# Patient Record
Sex: Female | Born: 1935 | Race: White | Hispanic: No | Smoking: Former smoker
Health system: Southern US, Community
[De-identification: ages and names within clinical notes are randomized; demographics above are authoritative.]

## PROBLEM LIST (undated history)

## (undated) DIAGNOSIS — R1030 Lower abdominal pain, unspecified: Secondary | ICD-10-CM

## (undated) DIAGNOSIS — R768 Other specified abnormal immunological findings in serum: Secondary | ICD-10-CM

## (undated) DIAGNOSIS — E119 Type 2 diabetes mellitus without complications: Secondary | ICD-10-CM

## (undated) DIAGNOSIS — J449 Chronic obstructive pulmonary disease, unspecified: Secondary | ICD-10-CM

## (undated) DIAGNOSIS — K589 Irritable bowel syndrome without diarrhea: Secondary | ICD-10-CM

## (undated) DIAGNOSIS — K2970 Gastritis, unspecified, without bleeding: Secondary | ICD-10-CM

## (undated) DIAGNOSIS — I10 Essential (primary) hypertension: Secondary | ICD-10-CM

## (undated) DIAGNOSIS — J189 Pneumonia, unspecified organism: Secondary | ICD-10-CM

## (undated) DIAGNOSIS — M349 Systemic sclerosis, unspecified: Secondary | ICD-10-CM

## (undated) DIAGNOSIS — K529 Noninfective gastroenteritis and colitis, unspecified: Secondary | ICD-10-CM

## (undated) DIAGNOSIS — I639 Cerebral infarction, unspecified: Secondary | ICD-10-CM

## (undated) DIAGNOSIS — F0390 Unspecified dementia without behavioral disturbance: Secondary | ICD-10-CM

## (undated) DIAGNOSIS — A048 Other specified bacterial intestinal infections: Secondary | ICD-10-CM

## (undated) DIAGNOSIS — G4730 Sleep apnea, unspecified: Secondary | ICD-10-CM

## (undated) DIAGNOSIS — I7300 Raynaud's syndrome without gangrene: Secondary | ICD-10-CM

## (undated) DIAGNOSIS — M109 Gout, unspecified: Secondary | ICD-10-CM

## (undated) DIAGNOSIS — K2270 Barrett's esophagus without dysplasia: Secondary | ICD-10-CM

## (undated) HISTORY — DX: Lower abdominal pain, unspecified: R10.30

## (undated) HISTORY — DX: Noninfective gastroenteritis and colitis, unspecified: K52.9

## (undated) HISTORY — DX: Barrett's esophagus without dysplasia: K22.70

## (undated) HISTORY — DX: Other specified bacterial intestinal infections: A04.8

## (undated) HISTORY — DX: Gastritis, unspecified, without bleeding: K29.70

## (undated) HISTORY — PX: CHOLECYSTECTOMY: SHX55

## (undated) HISTORY — DX: Irritable bowel syndrome, unspecified: K58.9

## (undated) HISTORY — PX: PATELLA FRACTURE SURGERY: SHX735

## (undated) HISTORY — PX: BACK SURGERY: SHX140

---

## 1998-09-26 ENCOUNTER — Other Ambulatory Visit: Admission: RE | Admit: 1998-09-26 | Discharge: 1998-09-26 | Payer: Self-pay | Admitting: Obstetrics and Gynecology

## 2000-03-08 HISTORY — PX: ESOPHAGOGASTRODUODENOSCOPY: SHX1529

## 2000-03-13 ENCOUNTER — Other Ambulatory Visit: Admission: RE | Admit: 2000-03-13 | Discharge: 2000-03-13 | Payer: Self-pay | Admitting: Obstetrics and Gynecology

## 2000-03-20 ENCOUNTER — Encounter: Payer: Self-pay | Admitting: Obstetrics and Gynecology

## 2000-03-20 ENCOUNTER — Encounter: Admission: RE | Admit: 2000-03-20 | Discharge: 2000-03-20 | Payer: Self-pay | Admitting: Obstetrics and Gynecology

## 2000-04-09 HISTORY — PX: LUNG SURGERY: SHX703

## 2000-07-23 ENCOUNTER — Encounter (HOSPITAL_COMMUNITY): Admission: RE | Admit: 2000-07-23 | Discharge: 2000-08-22 | Payer: Self-pay | Admitting: Orthopedic Surgery

## 2003-11-20 ENCOUNTER — Ambulatory Visit (HOSPITAL_COMMUNITY): Admission: RE | Admit: 2003-11-20 | Discharge: 2003-11-20 | Payer: Self-pay | Admitting: Family Medicine

## 2004-01-12 ENCOUNTER — Ambulatory Visit (HOSPITAL_COMMUNITY): Admission: RE | Admit: 2004-01-12 | Discharge: 2004-01-12 | Payer: Self-pay | Admitting: Family Medicine

## 2004-01-21 ENCOUNTER — Emergency Department (HOSPITAL_COMMUNITY): Admission: EM | Admit: 2004-01-21 | Discharge: 2004-01-21 | Payer: Self-pay | Admitting: Emergency Medicine

## 2004-03-10 ENCOUNTER — Ambulatory Visit (HOSPITAL_COMMUNITY): Admission: RE | Admit: 2004-03-10 | Discharge: 2004-03-10 | Payer: Self-pay | Admitting: Otolaryngology

## 2004-06-01 ENCOUNTER — Ambulatory Visit (HOSPITAL_COMMUNITY): Admission: RE | Admit: 2004-06-01 | Discharge: 2004-06-01 | Payer: Self-pay | Admitting: Family Medicine

## 2004-06-12 ENCOUNTER — Ambulatory Visit (HOSPITAL_COMMUNITY): Admission: RE | Admit: 2004-06-12 | Discharge: 2004-06-12 | Payer: Self-pay | Admitting: Family Medicine

## 2004-12-13 ENCOUNTER — Ambulatory Visit (HOSPITAL_COMMUNITY): Admission: RE | Admit: 2004-12-13 | Discharge: 2004-12-13 | Payer: Self-pay | Admitting: *Deleted

## 2005-01-30 ENCOUNTER — Ambulatory Visit: Payer: Self-pay | Admitting: Internal Medicine

## 2005-02-02 ENCOUNTER — Ambulatory Visit: Payer: Self-pay | Admitting: Internal Medicine

## 2005-02-02 ENCOUNTER — Encounter (INDEPENDENT_AMBULATORY_CARE_PROVIDER_SITE_OTHER): Payer: Self-pay | Admitting: Internal Medicine

## 2005-02-02 ENCOUNTER — Ambulatory Visit (HOSPITAL_COMMUNITY): Admission: RE | Admit: 2005-02-02 | Discharge: 2005-02-02 | Payer: Self-pay | Admitting: Internal Medicine

## 2005-02-02 HISTORY — PX: ESOPHAGOGASTRODUODENOSCOPY: SHX1529

## 2005-02-06 ENCOUNTER — Ambulatory Visit (HOSPITAL_COMMUNITY): Admission: RE | Admit: 2005-02-06 | Discharge: 2005-02-06 | Payer: Self-pay | Admitting: Internal Medicine

## 2005-04-03 ENCOUNTER — Ambulatory Visit (HOSPITAL_COMMUNITY): Admission: RE | Admit: 2005-04-03 | Discharge: 2005-04-03 | Payer: Self-pay | Admitting: Family Medicine

## 2005-05-14 ENCOUNTER — Ambulatory Visit: Payer: Self-pay | Admitting: Internal Medicine

## 2005-11-07 ENCOUNTER — Ambulatory Visit: Payer: Self-pay | Admitting: Internal Medicine

## 2005-11-13 ENCOUNTER — Ambulatory Visit (HOSPITAL_COMMUNITY): Admission: RE | Admit: 2005-11-13 | Discharge: 2005-11-13 | Payer: Self-pay | Admitting: Internal Medicine

## 2005-11-13 ENCOUNTER — Ambulatory Visit: Payer: Self-pay | Admitting: Internal Medicine

## 2006-05-30 ENCOUNTER — Ambulatory Visit: Payer: Self-pay | Admitting: Internal Medicine

## 2006-05-31 LAB — CBC AND DIFFERENTIAL: Hemoglobin: 12.1 g/dL (ref 12.0–16.0)

## 2006-06-03 ENCOUNTER — Ambulatory Visit: Payer: Self-pay | Admitting: Internal Medicine

## 2007-09-08 HISTORY — PX: ESOPHAGOGASTRODUODENOSCOPY: SHX1529

## 2008-03-26 ENCOUNTER — Ambulatory Visit (HOSPITAL_COMMUNITY): Admission: RE | Admit: 2008-03-26 | Discharge: 2008-03-26 | Payer: Self-pay | Admitting: Family Medicine

## 2008-12-08 HISTORY — PX: COLONOSCOPY: SHX174

## 2010-08-01 ENCOUNTER — Other Ambulatory Visit (INDEPENDENT_AMBULATORY_CARE_PROVIDER_SITE_OTHER): Payer: Self-pay | Admitting: Internal Medicine

## 2010-08-01 ENCOUNTER — Ambulatory Visit (INDEPENDENT_AMBULATORY_CARE_PROVIDER_SITE_OTHER): Payer: Medicare Other | Admitting: Internal Medicine

## 2010-08-01 DIAGNOSIS — R109 Unspecified abdominal pain: Secondary | ICD-10-CM

## 2010-08-01 DIAGNOSIS — R1030 Lower abdominal pain, unspecified: Secondary | ICD-10-CM

## 2010-08-04 ENCOUNTER — Ambulatory Visit (HOSPITAL_COMMUNITY)
Admission: RE | Admit: 2010-08-04 | Discharge: 2010-08-04 | Disposition: A | Discharge status: Home / Self Care | Payer: Medicare Other | Source: Ambulatory Visit | Attending: Internal Medicine | Admitting: Internal Medicine

## 2010-08-04 DIAGNOSIS — K5730 Diverticulosis of large intestine without perforation or abscess without bleeding: Secondary | ICD-10-CM | POA: Insufficient documentation

## 2010-08-04 DIAGNOSIS — R1030 Lower abdominal pain, unspecified: Secondary | ICD-10-CM

## 2010-08-04 DIAGNOSIS — I701 Atherosclerosis of renal artery: Secondary | ICD-10-CM | POA: Insufficient documentation

## 2010-08-04 DIAGNOSIS — R1031 Right lower quadrant pain: Secondary | ICD-10-CM | POA: Insufficient documentation

## 2010-08-04 DIAGNOSIS — R1032 Left lower quadrant pain: Secondary | ICD-10-CM | POA: Insufficient documentation

## 2010-08-04 MED ORDER — IOHEXOL 350 MG/ML SOLN
100.0000 mL | Freq: Once | INTRAVENOUS | Status: AC | PRN
  Administered 2010-08-04: 100 mL via INTRAVENOUS

## 2010-08-25 NOTE — Op Note (Signed)
Tina [name], Tina [name]                ACCOUNT NO.:  [id number]   MEDICAL RECORD NO.:  [id number]          PATIENT TYPE:  AMB   LOCATION:  DAY                           FACILITY:  [facility]   PHYSICIAN:  Lionel December, M.D.    DATE OF BIRTH:  [birth year]   DATE OF PROCEDURE:  11/13/2005  DATE OF DISCHARGE:                                 OPERATIVE REPORT   PROCEDURE:  Esophagogastroduodenoscopy with esophageal dilation.   INDICATIONS:  Tina [name] is a 77 year old Caucasian female with multiple medical  problems including scleroderma who presents with solid food dysphagia.  She  has chronic GERD with short segment Barrett's esophagus.  Her last EGD was  in October2006.  Procedure and risks were reviewed with the patient and  informed consent was obtained.   MEDS FOR CONSCIOUS SEDATION:  Benzocaine spray for oropharyngeal topical  anesthesia.  Demerol 50 mg, IV Versed 12 mg IV in divided dose.   FINDINGS:  Procedure performed in endoscopy suite.  The patient's vital  signs and O2 saturation were monitored during the procedure and remained  stable.  The patient was placed in the left lateral position and Olympus  videoscope was passed via oropharynx without any difficulty into the  esophagus.   ESOPHAGUS:  Mucosa of the esophagus normal except distally she had a short  segment Barrett's and the proximal margin was at 36.  GE junction was  estimated to be at 38 and hiatus was at 40.  Involvement of the distal  esophagus with Barrett's was irregular and there were two islands of  squamous mucosa right in the Barrett's.  There was no obvious stricture  noted.   STOMACH:  It was empty and distended very well with insufflation.  Folds in  the proximal stomach were normal.  Examination of the mucosa revealed both a  linear and a patchy antral vascular ectasia without stigmata of bleeding.  Pyloric channel was patent.  Angularis, fundus, and cardia were examined by  retroflexing the scope and were  normal.   DUODENUM BULBAR MUCOSA:  Normal.  Scope was passed into the second part of  duodenum where the mucosa and folds were normal.  Endoscope was withdrawn.   ESOPHAGUS:  The esophagus was dilated by passing 54-French Maloney dilator  to full insertion.  Endoscope was passed, again, post ED.  There was a small  linear tear to the cervical esophagus possibly indicative of web.  Please  note that esophageal varices were not evident on this exam.  She had grade 1  varices on her last exam.  Endoscope was withdrawn.  The patient tolerated  the procedure well.   FINAL DIAGNOSIS:  1.  Short segment Barrett's esophagus.  2.  Small sliding hiatal hernia.  3.  No obvious ring or stricture noted to esophagus.  4.  Esophageal dilation with 54-French Elease Hashimoto resulted in a small  tear in      cervical segment indicative of a web.  5.  Gastric antral vascular ectasia which usually are seen with  scleroderma      that she has.  This may  also account for anemia.  However, her Hemoccult      that she brought today is negative.   RECOMMENDATIONS:  She will continue her usual meds.  She can resume her ASA.  Her H&H will need to be followed closely.  She will have one in the near  future as recommended by Dr. Jacinto Halim.      Lionel December, M.D.  Electronically Signed     NR/MEDQ  D:  11/13/2005  T:  11/13/2005  Job:  [id number]   cc:   Corrie Mckusick, M.D.  Fax: [phone]   Cristy Hilts. Jacinto Halim, MD  Fax: [phone]

## 2010-08-25 NOTE — Consult Note (Signed)
[name], [name]                ACCOUNT NO.:  [id number]   MEDICAL RECORD NO.:  [id number]           PATIENT TYPE:  AMB   LOCATION:                                FACILITY:  [facility]   PHYSICIAN:  Lionel December, M.D.    DATE OF BIRTH:  July 18, 1933   DATE OF CONSULTATION:  11/07/2005  DATE OF DISCHARGE:                                   CONSULTATION   PRESENTING COMPLAINT:  Dysphagia of one month's duration.   HISTORY OF PRESENT ILLNESS:  Tina [name] is a 77 year old Caucasian female who is  being seen at request by Dr. Phillips Odor for GI evaluation.  Patient has been  seen by me in the past.  Her last visit was in February 2007.  Her last EGD  was in October 2006.  She had short segment Barrett's esophagus and three  columns of grade 1 esophageal varices and a hypoplastic fundal polyp.  She  had small sliding hiatal hernia.  She had her esophagus dilated with 79-  French Maloney dilator and had a small tear in the esophagus indicative of a  web or a tubular narrowing.  When last seen in February she was doing well.  She states she has been having difficulty with solids and pills for about a  month.  She has also been experiencing regurgitation but she denies vomiting  or frequent heartburn.  She was recently treated with Prevpac for two weeks.  She developed abdominal pain and diarrhea.  She was begun on metronidazole  by Dr. Phillips Odor but she stopped the medicine today because she had a normal  stool.  She states her appetite is good but she is afraid to eat because  food would not go down.  She has scleroderma.  She feels that she is having  a relapse.  She is under care of her rheumatologist from [facility] in [place], [place].  She has also seen Dr. Jacinto Halim of [facility] and felt to have coronary artery disease.  She is scheduled to  have some lab studies by him.  She denies melena or rectal bleeding.   She is on ASA 81 mg daily, vitamin E 400 units daily, Cardizem 360 mg  daily,  HCTZ 25 mg daily, K-Dur 20 mEq daily, Nexium 40 mg b.i.d., Lipitor 20 mg  daily, zolpidem 10 mg nightly, metronidazole 500 mg t.i.d. which she did not  today, lisinopril 10 mg daily, paroxetine 20 mg daily.   PAST MEDICAL HISTORY:  1.  Scleroderma since 1988 or 1989 with severe disability.  She has      deformity to both her hands with weak grip because of joint deformity.  2.  Chronic GERD complicated by Barrett's esophagus.  Underlying esophageal      motility is suspected given her connective tissue disorder, although she      has not had formal testing.  3.  History of H. pylori gastritis which was treated by Dr. Gwinda Passe long      time ago and apparently she retreated recently.  4.  She has hypertension.  5.  She has insomnia.  6.  Chronic back pain.  7.  COPD.  8.  Psoriasis.  9.  Raynaud's phenomenon.  10. She also has history of IBS.  11. She had lumbar surgery for disk prolapse several years ago.  12. She had cholecystectomy over 20 years ago.  13. She had right lung surgery for pneumothorax in 1988.  14. She has had right knee surgery for patellar fracture.  15. Her last colonoscopy was in 1999 by Dr. Gwinda Passe of [facility].  She had      melanosis coli and mild diverticulosis.   ALLERGIES:  NK.   FAMILY HISTORY:  Negative for colorectal carcinoma.  One brother died of  melanoma and also had the carcinoma of the bladder.  Another brother had  lung carcinoma.   SOCIAL HISTORY:  She is married.  She has two daughters.  She is retired  from [employer].  She smoked cigarettes for years, but quit 18 years ago.  She does not drink alcohol.   OBJECTIVE:  VITAL SIGNS:  Weight 179 pounds.  She is 5 feet 8 inches tall.  Pulse 64 per minute, blood pressure 152/68, temperature is 97.9.  HEENT:  Conjunctiva is pink.  Sclera is nonicteric.  Oral pharyngeal mucosa  is normal.  No neck masses or thyromegaly noted.  CARDIAC:  Regular rhythm.  Normal S1 and S3.  No murmur or gallop  noted.  LUNGS:  Clear to auscultation.  ABDOMEN:  Full.  Bowel sounds are normal.  She has soft abdomen and mild  generalized tenderness, slightly more pronounced in mid epigastric area, but  no organomegaly or masses noted.  RECTAL:  Deferred.  EXTREMITIES:  She has deformity to both her hands.  She has muscle atrophy  with extension of her metacarpal phalangeal joints and proximal  interphalangeal joints with flexion of her distal interphalangeal joints of  both hands.  She has weak grip.  She does not have peripheral edema or  clubbing.   Labs from May 14, 2005:  Her H&H was 11.6 and 38.7, platelet count was  409,000.  Bilirubin was 0.7, AP 70, AST 48, ALT was 33, albumin 4.3, calcium  was 9.1.   ASSESSMENT:  Tina [name] is 77 year old Caucasian female with chronic  gastroesophageal reflux disease complicated by short segment Barrett's  esophagus who also has scleroderma who presents with solid food dysphagia.  Her esophagus was last dilated in September 2006.  She has recently been  treated with Prevpac.  She may also have superadded candida esophagitis,  although she does not have oral candidiasis.  I feel her upper  gastrointestinal tract needs to be reevaluated and esophagus dilated if  appropriate.   RECOMMENDATIONS:  She will continue antireflux measures and a PPI double  dose.   Diagnostic and possibly therapeutic esophagogastroduodenoscopy in near  future.  Hemoccult x1.  We will request a copy of her next blood work that  she is supposed to have later this month.   We would like to thank Dr. Phillips Odor for opportunity to participate in the  care of this nice lady.      Lionel December, M.D.  Electronically Signed     NR/MEDQ  D:  11/07/2005  T:  11/08/2005  Job:  [id number]   cc:   Cristy Hilts. Jacinto Halim, MD  Fax: [phone]

## 2010-08-25 NOTE — Op Note (Signed)
Tina [name], Tina [name]                ACCOUNT NO.:  [id number]   MEDICAL RECORD NO.:  [id number]          PATIENT TYPE:  AMB   LOCATION:  DAY                           FACILITY:  [facility]   PHYSICIAN:  Lionel December, M.D.    DATE OF BIRTH:  02-20-1934   DATE OF PROCEDURE:  02/02/2005  DATE OF DISCHARGE:                                 OPERATIVE REPORT   PROCEDURE:  Esophagogastroduodenoscopy with esophageal dilation.   INDICATION:  Tina [name] is a 77 year old Caucasian female with longstanding  scleroderma as well as chronic GERD who presents with dysphagia and poorly  controlled heartburn. Her Prilosec dose was bumped to 20 mg b.i.d. a couple  of days ago. She is undergoing diagnostic/therapeutic procedure. Procedure  risks were reviewed with the patient, and informed consent was obtained.   PREMEDICATION:  Cetacaine spray pharyngeal topical anesthesia, Demerol 50 mg  IV, Versed 13 mg IV.   FINDINGS:  Procedure performed in endoscopy suite. The patient's vital signs  and O2 saturation were monitored during the procedure and remained stable.  The patient was placed in left lateral position. Olympus videoscope was  passed via oropharynx without any difficulty into esophagus.   Esophagus. Mucosa of the esophagus was normal except distally she had two  patches of Barrett's type mucosa, no more than 1 cm in diameter. GE junction  was at 38 cm and hiatus was at 40. No ring or stricture was noted.   Stomach. It was empty and distended very well insufflation. Folds of  proximal stomach were normal. Examination of mucosa revealed two polyps just  below the cardia. One was a tubular. Biopsy was taken for histology.  Endoscopically, these were suspicious for hyperplastic polyps, and therefore  polypectomy was not attempted. There were multiple whitish plaques involving  antral mucosa 2-4 mm in size. The biopsy was taken from these plaques for  routine histology. Pyloric channel was patent. Angularis  was unremarkable.   Duodenum. Bulbar mucosa was normal. Scope was passed to the second part of  the duodenum where mucosa and folds were normal. Endoscope was withdrawn.   Esophagus was dilated by passing 54-French Maloney dilator to full  insertion. Some resistance was noted initially. As the dilator was  withdrawn, endoscope was passed again, and there was a linear tear at  cervical esophagus indicative either of web or luminal narrowing. Endoscope  was withdrawn. The patient tolerated the procedure well.   FINAL DIAGNOSIS:  1.  Multiple findings.  2.  Short segment Barrett's mucosa which is stable/unchanged since previous      EGD.  3.  New finding of grade 1 three columns of esophageal varices.  4.  Fundal gastric polyps which were biopsied for histology. Antral mucosal      plaques which were also biopsied for histology.  5.  Small sliding hiatal hernia.  6.  Esophageal dial dilation resulted in linear tear at cervical esophagus      indicative of web or tubular narrowing.   RECOMMENDATIONS:  1.  She will continue anti-reflux measures and Prilosec at b.i.d.  2.  The abdominal ultrasound  will be obtained with attention to liver and      spleen to make sure she does not have changes to suggest cirrhosis.  3.  I will be contacting the patient with results of biopsy and ultrasound,      and I will be contacting the patient with results of pending studies.      Lionel December, M.D.  Electronically Signed     NR/MEDQ  D:  02/02/2005  T:  02/02/2005  Job:  [id number]   cc:   Corrie Mckusick, M.D.  Fax: [phone]

## 2010-08-25 NOTE — Consult Note (Signed)
NAMELEXANDRA, RETTKE                ACCOUNT NO.:  [id number]   MEDICAL RECORD NO.:  [id number]          PATIENT TYPE:  AMB   LOCATION:  DAY                           FACILITY:  [facility]   PHYSICIAN:  Lionel December, M.D.    DATE OF BIRTH:  03-31-1934   DATE OF CONSULTATION:  01/30/2005  DATE OF DISCHARGE:                                   CONSULTATION   REASON FOR CONSULTATION:  Dysphagia, chronic gastroesophageal reflux  disease.   HISTORY OF PRESENT ILLNESS:  Tina [name] is a 77 year old Caucasian female who is  referred through the courtesy of Dr. Phillips Odor for GI evaluation.  She has  chronic GERD, possibly related to her scleroderma who has been experiencing  dysphagia for about a year, primarily systolic.  She feels as if food gets  stuck in her suprasternal area.  She also complains of heartburn at least  twice a week, usually relieved with Rolaids.  She also complains of cough  frequently to clear her throat as well as frequent burping and intermittent  vomiting usually once a week.  She was seen by Dr. Pollyann Kennedy in November 2005  for throat symptoms.  Her flexible fiberoptic laryngoscopy was normal.  He  felt that she had some chronic oto-nasal possibly due to chronic sinusitis.  Clearly, she also had a CT of the sinuses.  At that time, she was  complaining of dysphagia, and he had recommended GI follow up.  The patient  says she has a good appetite.  She has gained about 18 pounds in the last 4-  1/2 years.  She says it has been occurring gradually.  Lately, she has not  been physically active.  Her daughter states that when her mother was  living, she would go to the nursing home everyday, but since she has passed  away, the patient has quit doing that.   Her last EGD was in November 2001.  She had small sliding hiatal hernia,  possibly a patch of Barrett's esophagus.  She has fine nodularity to  entrance consistent with gastritis.  Two tiny polyps that were ablated by  cold biopsy, and  turned out to be hypoplastic polyps.  She apparently has  been treated for H. pylori gastritis per Dr. Elaina Hoops.  Her prior EGD was by  him back in June 1990.   REVIEW OF SYSTEMS:  Negative for melena or rectal bleeding.  She has  irregular bowel movements.  She has had at least one colonoscopy, but she  does not remember when.  She was supposed to have colonoscopy in July 2000  at [facility], but it is not clear whether she had the procedure or not.  Hospital records will be reviewed.   MEDICATIONS:  1.  Prilosec 20 mg q.a.m.  2.  Imdur 60 mg daily.  3.  Ambien 10 mg q.h.s.  4.  Ativan AS 81 mg daily.  5.  Carafate 1 gm a.c. and q.h.s. p.r.n.  6.  Vitamin E daily.  7.  Cardizem SR 300 mg daily.  8.  HCTZ 25 mg daily.  9.  Reglan 10 mg q.a.m. and q.h.s. p.r.n.   PAST MEDICAL HISTORY:  1.  History of scleroderma which was diagnosed in 45.  He has been on      prednisone in the past.  2.  History of hypertension.  3.  Reflux esophagitis as reviewed above.  4.  Chronic insomnia.  5.  Lumbar surgery for disk prolapse over 30 years ago.  6.  Cholecystectomy about 25 years ago.  7.  Right lung surgery in 1988 for pneumothorax.  She possibly had      decortication.  8.  Right knee arthrostomy for patellar fracture injury.  9.  COPD.  10. Psoriasis.  11. Raynaud's phenomenon.  12. Coronary artery disease.  13. According to her daughter, she had nuclear medicine testing and CT is      suspected to have one vessel disease.  She is felt to be high risk for      cardiac catheterization.  She has seen Dr. Jacinto Halim.  14. IBS.   ALLERGIES:  NK.   FAMILY HISTORY:  Mother lived to be 17.  Father died of heart disease at age  26, also had asthma.  One sister died at age 62 because of house fire.  One  brother died at age 96.  Another brother had multiple malignancies including  melanoma, carcinoma of the bladder as well as lungs.  Another brother is  being treated for carcinoma of the  lung.   SOCIAL HISTORY:  She is married.  She has two daughters.  One lives in town.  She is retired from [employer].  She smoked for about 15 years but quit  several years ago, and she does not drink alcohol.  She and her husband live  in [place].   PHYSICAL EXAMINATION:  GENERAL APPEARANCE:  Pleasant, well-developed, well-  nourished, Caucasian female who was in no acute distress.  VITAL SIGNS:  Weight 189-1/2 pounds, height 68 inches, pulse 100, blood  pressure 152/80, temperature 98.4.  HEENT:  Conjunctivae is pink.  Sclerae is nonicteric.  Oropharyngeal mucosa  is normal.  NECK:  Without masses or thyromegaly.  Carotids are 2+ bilaterally without  bruits.  CARDIAC:  Regular rhythm.  Normal S1, S2.  No murmur or gallop noted.  LUNGS:  Clear to auscultation.  ABDOMEN:  Symmetrical.  Bowel sounds are normal on palpation.  Soft and  nontender without organomegaly or masses.  RECTAL:  Deferred.  EXTREMITIES:  She has some atrophy to muscles in both forearms, and she also  has some atrophy in the palm of her hands.  She does not have clubbing or  peripheral edema.   ASSESSMENT:  Tina [name] is a 77 year old Caucasian female with chronic GERD,  possibly related to esophageal dysmotility secondary to underlying  scleroderma whose symptoms are not well controlled with PPI who now presents  with dysphagia.  Her last EGD was in November 2005 with question of short  segment Barrett's esophagus.  She does not have any alarm systems.   She is average risk for colorectal carcinoma.  Presuming she has had  colonoscopy in the last five years, she would not need another one for at  least five.   RECOMMENDATIONS:  1.  Antireflux measures reinforced.  2.  Raise Prilosec to 20 mg b.i.d.  Prescription given for one month with 11      refills.  3.  She will undergo esophagogastroduodenoscopy and esophageal dilation at     [facility] in the near future.  I  have asked her to hold offer her ASA just for       two days.  Further recommendations will depend on endoscopic findings.  4.  We would like to thank Dr. Phillips Odor for the opportunity to participate in      the care of this nice lady.  5.  She could have esophageal stricture, Schatzki's ring or motility      disorder.   ASSESSMENT:  Hopefully, we are dealing with esophageal stricture which  should be amenable to endoscopic surgery.  Dysphagia secondary to motility  disorder would be difficult to treat.   Average risk for CRC.  Will review old records and find out timing of her  last colonoscopy.   RECOMMENDATIONS:  1.  Antireflux measures reinforced.  Will increase her Prilosec to 20 mg      b.i.d.  Prescription given for #60 with 11 refills.  2.  Esophagogastroduodenoscopy impossible esophageal dilation at [facility] in the near future.  She would hold off her ASA just for two      days prior to the procedure.   We would like to thank Dr. Phillips Odor for the opportunity to participate in the  care of this nice lady.      Lionel December, M.D.  Electronically Signed     NR/MEDQ  D:  01/30/2005  T:  01/31/2005  Job:  [id number]   cc:   Corrie Mckusick, M.D.  Fax: [phone]

## 2010-11-13 ENCOUNTER — Encounter (INDEPENDENT_AMBULATORY_CARE_PROVIDER_SITE_OTHER): Payer: Self-pay | Admitting: *Deleted

## 2010-11-14 ENCOUNTER — Encounter (INDEPENDENT_AMBULATORY_CARE_PROVIDER_SITE_OTHER): Payer: Self-pay

## 2010-12-18 ENCOUNTER — Ambulatory Visit (INDEPENDENT_AMBULATORY_CARE_PROVIDER_SITE_OTHER): Payer: Medicare Other | Admitting: Internal Medicine

## 2011-05-03 DIAGNOSIS — Z6826 Body mass index (BMI) 26.0-26.9, adult: Secondary | ICD-10-CM | POA: Diagnosis not present

## 2011-05-03 DIAGNOSIS — B9789 Other viral agents as the cause of diseases classified elsewhere: Secondary | ICD-10-CM | POA: Diagnosis not present

## 2011-05-03 DIAGNOSIS — J069 Acute upper respiratory infection, unspecified: Secondary | ICD-10-CM | POA: Diagnosis not present

## 2011-06-18 DIAGNOSIS — G4700 Insomnia, unspecified: Secondary | ICD-10-CM | POA: Diagnosis not present

## 2011-06-18 DIAGNOSIS — R300 Dysuria: Secondary | ICD-10-CM | POA: Diagnosis not present

## 2011-06-18 DIAGNOSIS — N390 Urinary tract infection, site not specified: Secondary | ICD-10-CM | POA: Diagnosis not present

## 2011-06-18 DIAGNOSIS — Z6826 Body mass index (BMI) 26.0-26.9, adult: Secondary | ICD-10-CM | POA: Diagnosis not present

## 2011-07-06 DIAGNOSIS — Z6827 Body mass index (BMI) 27.0-27.9, adult: Secondary | ICD-10-CM | POA: Diagnosis not present

## 2011-07-06 DIAGNOSIS — J069 Acute upper respiratory infection, unspecified: Secondary | ICD-10-CM | POA: Diagnosis not present

## 2011-07-06 DIAGNOSIS — R300 Dysuria: Secondary | ICD-10-CM | POA: Diagnosis not present

## 2011-07-23 ENCOUNTER — Ambulatory Visit (HOSPITAL_COMMUNITY)
Admission: RE | Admit: 2011-07-23 | Discharge: 2011-07-23 | Disposition: A | Discharge status: Home / Self Care | Payer: Medicare Other | Source: Ambulatory Visit | Attending: Family Medicine | Admitting: Family Medicine

## 2011-07-23 ENCOUNTER — Other Ambulatory Visit (HOSPITAL_COMMUNITY): Payer: Self-pay | Admitting: Family Medicine

## 2011-07-23 DIAGNOSIS — R059 Cough, unspecified: Secondary | ICD-10-CM | POA: Diagnosis not present

## 2011-07-23 DIAGNOSIS — R05 Cough: Secondary | ICD-10-CM

## 2011-07-23 DIAGNOSIS — R091 Pleurisy: Secondary | ICD-10-CM | POA: Diagnosis not present

## 2011-07-23 DIAGNOSIS — J9383 Other pneumothorax: Secondary | ICD-10-CM | POA: Diagnosis not present

## 2011-07-23 DIAGNOSIS — R0989 Other specified symptoms and signs involving the circulatory and respiratory systems: Secondary | ICD-10-CM | POA: Diagnosis not present

## 2011-07-23 DIAGNOSIS — M479 Spondylosis, unspecified: Secondary | ICD-10-CM | POA: Insufficient documentation

## 2011-07-23 DIAGNOSIS — K219 Gastro-esophageal reflux disease without esophagitis: Secondary | ICD-10-CM | POA: Insufficient documentation

## 2011-07-23 DIAGNOSIS — I77810 Thoracic aortic ectasia: Secondary | ICD-10-CM | POA: Insufficient documentation

## 2011-07-23 DIAGNOSIS — Z6826 Body mass index (BMI) 26.0-26.9, adult: Secondary | ICD-10-CM | POA: Diagnosis not present

## 2011-07-23 DIAGNOSIS — Z139 Encounter for screening, unspecified: Secondary | ICD-10-CM

## 2011-07-25 ENCOUNTER — Other Ambulatory Visit (HOSPITAL_COMMUNITY): Payer: Medicare Other

## 2011-08-01 ENCOUNTER — Encounter (INDEPENDENT_AMBULATORY_CARE_PROVIDER_SITE_OTHER): Payer: Self-pay | Admitting: Internal Medicine

## 2011-08-01 ENCOUNTER — Ambulatory Visit (INDEPENDENT_AMBULATORY_CARE_PROVIDER_SITE_OTHER): Payer: Medicare Other | Admitting: Internal Medicine

## 2011-08-01 ENCOUNTER — Other Ambulatory Visit (INDEPENDENT_AMBULATORY_CARE_PROVIDER_SITE_OTHER): Payer: Self-pay | Admitting: *Deleted

## 2011-08-01 ENCOUNTER — Encounter (INDEPENDENT_AMBULATORY_CARE_PROVIDER_SITE_OTHER): Payer: Self-pay | Admitting: *Deleted

## 2011-08-01 VITALS — BP 112/48 | HR 72 | Temp 97.4°F | Ht 68.0 in | Wt 177.5 lb

## 2011-08-01 DIAGNOSIS — E7800 Pure hypercholesterolemia, unspecified: Secondary | ICD-10-CM | POA: Insufficient documentation

## 2011-08-01 DIAGNOSIS — I7300 Raynaud's syndrome without gangrene: Secondary | ICD-10-CM | POA: Insufficient documentation

## 2011-08-01 DIAGNOSIS — J449 Chronic obstructive pulmonary disease, unspecified: Secondary | ICD-10-CM | POA: Insufficient documentation

## 2011-08-01 DIAGNOSIS — M109 Gout, unspecified: Secondary | ICD-10-CM | POA: Insufficient documentation

## 2011-08-01 DIAGNOSIS — I10 Essential (primary) hypertension: Secondary | ICD-10-CM | POA: Insufficient documentation

## 2011-08-01 DIAGNOSIS — R1310 Dysphagia, unspecified: Secondary | ICD-10-CM

## 2011-08-01 DIAGNOSIS — R1314 Dysphagia, pharyngoesophageal phase: Secondary | ICD-10-CM

## 2011-08-01 DIAGNOSIS — K2270 Barrett's esophagus without dysplasia: Secondary | ICD-10-CM | POA: Diagnosis not present

## 2011-08-01 NOTE — Progress Notes (Addendum)
Subjective:     Patient ID: Tina [name], female   DOB: 08/03/1933, 78 y.o.   MRN: [id number]  HPI  Tina [name] is a 78 yr old female presenting today with c/o dysphagia.   Her daughter says food are lodging in her esophagus.  Symptoms for at least a year and progressively worsened. Appetite is good. Weight loss of 9 pounds in 2 months. No abdominal pain. BM x 3 a day. No melena or bright red rectal bleeding.   5/10 Barium Swallow/Barium tablet study: Tiny beginning Zenker's diverticulum in the cervical esophagus. Mild decrease in primary and secondary peristalsis. No esophageal dilatation was evidenced. Minimal spontaneous GE reflux occurred. This reached the midthoracic esophagus and was cleared by secondary peristalsis. No hiatal hernia or mucosal changes of esophagitis were seen. No stricture was evident. When the patient swallowed the 13mm barium pill, it passed thru the esophagus into the stomach with no evidence of stricture or obstruction.     EGD/ED 09/2007 Dr. Karilyn Cota No evidence of erosive ulcerative esophagitis stricture or ring formation. Short segment Barrett's esophagus which is stable sine previous exam. 2 cm size sliding hiatal hernia. Three gastric polyps, larges one 23 mm x 6 mm at cardia. 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 Esophagus was dilated by passing 54 [place] Maloney dilator.  Biopsy: Hyperplastic polyp      11/2005 EGD/ED:FINAL DIAGNOSIS:  1. Short segment Barrett's esophagus.  2. Small sliding hiatal hernia.  3. No obvious ring or stricture noted to esophagus.  4. Esophageal dilation with 54-French Elease Hashimoto resulted in a small tear in  cervical segment indicative of a web.  5. Gastric antral vascular ectasia which usually are seen with scleroderma  that she has. This may also account for anemia. However, her Hemoccult  that she brought today is negative.    Review of Systems see hpi Current Outpatient Prescriptions  Medication Sig  Dispense Refill  . allopurinol (ZYLOPRIM) 100 MG tablet Take by mouth daily.        . citalopram (CELEXA) 20 MG tablet Take 20 mg by mouth daily.        Marland Kitchen diltiazem (CARDIZEM CD) 180 MG 24 hr capsule Take 180 mg by mouth daily.        Marland Kitchen esomeprazole (NEXIUM) 40 MG capsule Take 40 mg by mouth daily before breakfast.        . hydrochlorothiazide 25 MG tablet Take 25 mg by mouth daily.        Marland Kitchen lisinopril (PRINIVIL,ZESTRIL) 10 MG tablet Take 10 mg by mouth daily.        . metoprolol tartrate (LOPRESSOR) 25 MG tablet Take 25 mg by mouth 2 (two) times daily.        . simvastatin (ZOCOR) 40 MG tablet Take 40 mg by mouth at bedtime.        . sitaGLIPtin (JANUVIA) 100 MG tablet Take 100 mg by mouth daily.        . sucralfate (CARAFATE) 1 G tablet Take 1 g by mouth 4 (four) times daily.        Marland Kitchen zolpidem (AMBIEN CR) 12.5 MG CR tablet Take by mouth at bedtime as needed.         Past Medical History  Diagnosis Date  . Lower abdominal pain   . Chronic diarrhea   . Barrett's esophagus   . Irritable bowel syndrome   . Helicobacter pylori (H. pylori)   . Gastritis    Past Surgical History  Procedure Date  .  Esophagogastroduodenoscopy 06/09  . Colonoscopy 12/2008  . Patella fracture surgery   . Esophagogastroduodenoscopy 02/02/05    EGD ED  . Esophagogastroduodenoscopy 03/08/00    EGD ED   History   Social History  . Marital Status: Widowed    Spouse Name: N/A    Number of Children: N/A  . Years of Education: N/A   Occupational History  . Not on file.   Social History Main Topics  . Smoking status: Never Smoker   . Smokeless tobacco: Not on file  . Alcohol Use: No  . Drug Use: No  . Sexually Active: Not on file   Other Topics Concern  . Not on file   Social History Narrative  . No narrative on file   Family Status  Relation Status Death Age  . Mother Deceased     old age, intestinal problems  . Father Deceased     asthma  . Sister Deceased     died in a fire  . Brother  Deceased     Two died with melanoma.  One had an MI   Allergies not on file       Objective:   Physical Exam Filed Vitals:   08/01/11 1604  Height: 5\' 8"  (1.727 m)  Weight: 177 lb 8 oz (80.513 kg)   Alert and oriented. Skin warm and dry. Oral mucosa is moist.   . Sclera anicteric, conjunctivae is pink. Thyroid not enlarged. No cervical lymphadenopathy. Lungs clear. Heart regular rate and rhythm.  Abdomen is soft. Bowel sounds are positive. No hepatomegaly. No abdominal masses felt. No tenderness.  No edema to lower extremities. Patient is alert and oriented.      Assessment:    Dysphagia to solids. Esohageal web/stricture needs to be ruled out. Hx of same in the past    Plan:    EGD/ED with Dr. Karilyn Cota. Will obtain her last EGD/ED from [facility]

## 2011-08-01 NOTE — Patient Instructions (Signed)
Dysphagia  Swallowing problems (dysphagia) occur when solids and liquids seem to stick in your throat on the way down to your stomach, or the food takes longer to get to the stomach. Other symptoms (problems) include regurgitating (burping) up food, noises coming from the throat, chest discomfort with swallowing, and a feeling of fullness in the throat when swallowing. When blockage in the throat is complete it may be associated with drooling.  CAUSES  There are many causes of swallowing difficulties and the following is generalized information regarding a number of reasons for this problem. Problems with swallowing may occur because of problems with the muscles. The food cannot be propelled in the usual manner into the stomach. There may be ulcers, scar tissueor inflammation (soreness) in the esophagus (the food tube from the mouth to the stomach) which blocks food from passing normally into the stomach. Causes of inflammation include acid reflux from the stomach into the esophagus. Inflammation can also be caused by the herpes simplex virus, Candida (yeast), radiation (as with treatment of cancer), or inflammation from medications not taken with adequate fluids to wash them down into the stomach. There may be nerve problems so signals cannot be sent adequately telling the muscles of the esophagus to contract and move the food along. Achalasia is a rare disorder of the esophagus in which muscular contractions of the esophagus are uncoordinated. Globus hystericus is a relatively common problem in young females in which there is a sense of an obstruction or difficulty in swallowing, but in which no abnormalities can be found. This problem usually improves over time with reassurance and testing to rule out other causes.  DIAGNOSIS  A number of tests will help your caregiver know what is the cause of your swallowing problems. These tests may include a barium swallow in which x-rays are taken while you are drinking a  liquid that outlines the lining of the esophagus on x-ray. If the stomach and small bowel are also studied in this manner it is called an upper gastrointestinal exam (UGI). Endoscopy may be done in which your caregiver examines your throat, esophagus, stomach and small bowel with an instrument like a small flexible telescope. Motility studies which measure the effectiveness and coordination of the muscular contractions of the esophagus may also be done.  TREATMENT  The treatment of swallowing problems are many, varying from medications to surgical treatment. The treatment varies with the type of problem found. Your caregiver will discuss your results and treatment with you. If swallowing problems are severe the long term problems which may occur include: malnutrition, pneumonia (from food going into the breathing tubes called trachea and bronchi), and an increase in tumors (lumps) of the esophagus.  SEEK IMMEDIATE MEDICAL CARE IF:   Food or other object becomes lodged in your throat or esophagus and won't move.  Document Released: 03/23/2000 Document Revised: 03/15/2011 Document Reviewed: 11/12/2007  [employer] Patient Information 2012 [employer].

## 2011-08-03 ENCOUNTER — Telehealth (INDEPENDENT_AMBULATORY_CARE_PROVIDER_SITE_OTHER): Payer: Self-pay | Admitting: *Deleted

## 2011-08-03 NOTE — Telephone Encounter (Signed)
Tina [name] called regarding her mother's upcoming EGD/ED on 08/24/11. She thinks that the last one was 2006/2007. Her mother has Scleroderma , and Barrett's Esophagus. At her office visit recently with Terri , she advised that this procedure should be done. They are concerned because they thought Dr.Rehman had told them he may not ever be able to stretch the Esophagus again due to her Scleroderma. Patient has been coughing bad, choking on food and Dr.Golding did a Chest X-ray, daughter ask if Dr.Rehman would review this and also let them know if he could do the EGD/ED or just the EGD? Just asking for reassurance, and was very appreciative of Terri. Tina may be reached at [phone].

## 2011-08-06 NOTE — Telephone Encounter (Signed)
Patient's daughter called message left on answering service. Do not see contraindication to esophageal dilation

## 2011-08-11 DIAGNOSIS — N390 Urinary tract infection, site not specified: Secondary | ICD-10-CM | POA: Diagnosis not present

## 2011-08-11 DIAGNOSIS — Z6827 Body mass index (BMI) 27.0-27.9, adult: Secondary | ICD-10-CM | POA: Diagnosis not present

## 2011-08-11 DIAGNOSIS — R300 Dysuria: Secondary | ICD-10-CM | POA: Diagnosis not present

## 2011-08-13 DIAGNOSIS — R300 Dysuria: Secondary | ICD-10-CM | POA: Diagnosis not present

## 2011-08-13 DIAGNOSIS — N390 Urinary tract infection, site not specified: Secondary | ICD-10-CM | POA: Diagnosis not present

## 2011-08-22 ENCOUNTER — Telehealth (INDEPENDENT_AMBULATORY_CARE_PROVIDER_SITE_OTHER): Payer: Self-pay | Admitting: *Deleted

## 2011-08-22 NOTE — Telephone Encounter (Signed)
Please let her know no issue with aspirin.

## 2011-08-22 NOTE — Telephone Encounter (Signed)
[name], patient's daughter, called and said her mother takes Asprin 81 mg. Naileah has a procedure scheduled for Friday, 08/24/11. Advised her not to take anymore until after the procedure. She didn't take the Asprin 05/15-17/13.

## 2011-08-22 NOTE — Telephone Encounter (Signed)
Addressd with daughter

## 2011-08-23 MED ORDER — SODIUM CHLORIDE 0.45 % IV SOLN
Freq: Once | INTRAVENOUS | Status: AC
  Administered 2011-08-24: 08:00:00 via INTRAVENOUS

## 2011-08-24 ENCOUNTER — Encounter (HOSPITAL_COMMUNITY): Payer: Self-pay

## 2011-08-24 ENCOUNTER — Encounter (HOSPITAL_COMMUNITY)
Admission: RE | Disposition: A | Discharge status: Home / Self Care | Payer: Self-pay | Source: Ambulatory Visit | Attending: Internal Medicine

## 2011-08-24 ENCOUNTER — Ambulatory Visit (HOSPITAL_COMMUNITY)
Admission: RE | Admit: 2011-08-24 | Discharge: 2011-08-24 | Disposition: A | Discharge status: Home / Self Care | Payer: Medicare Other | Source: Ambulatory Visit | Attending: Internal Medicine | Admitting: Internal Medicine

## 2011-08-24 DIAGNOSIS — K219 Gastro-esophageal reflux disease without esophagitis: Secondary | ICD-10-CM

## 2011-08-24 DIAGNOSIS — Z79899 Other long term (current) drug therapy: Secondary | ICD-10-CM | POA: Diagnosis not present

## 2011-08-24 DIAGNOSIS — E119 Type 2 diabetes mellitus without complications: Secondary | ICD-10-CM | POA: Diagnosis not present

## 2011-08-24 DIAGNOSIS — Z7982 Long term (current) use of aspirin: Secondary | ICD-10-CM | POA: Diagnosis not present

## 2011-08-24 DIAGNOSIS — K2960 Other gastritis without bleeding: Secondary | ICD-10-CM

## 2011-08-24 DIAGNOSIS — G4733 Obstructive sleep apnea (adult) (pediatric): Secondary | ICD-10-CM | POA: Diagnosis not present

## 2011-08-24 DIAGNOSIS — I10 Essential (primary) hypertension: Secondary | ICD-10-CM | POA: Insufficient documentation

## 2011-08-24 DIAGNOSIS — K2270 Barrett's esophagus without dysplasia: Secondary | ICD-10-CM | POA: Insufficient documentation

## 2011-08-24 DIAGNOSIS — K228 Other specified diseases of esophagus: Secondary | ICD-10-CM

## 2011-08-24 DIAGNOSIS — R1310 Dysphagia, unspecified: Secondary | ICD-10-CM | POA: Insufficient documentation

## 2011-08-24 HISTORY — DX: Sleep apnea, unspecified: G47.30

## 2011-08-24 HISTORY — DX: Systemic sclerosis, unspecified: M34.9

## 2011-08-24 HISTORY — DX: Other specified abnormal immunological findings in serum: R76.8

## 2011-08-24 SURGERY — ESOPHAGOGASTRODUODENOSCOPY (EGD) WITH ESOPHAGEAL DILATION
Anesthesia: Moderate Sedation

## 2011-08-24 MED ORDER — MIDAZOLAM HCL 5 MG/5ML IJ SOLN
INTRAMUSCULAR | Status: AC
  Filled 2011-08-24: qty 10

## 2011-08-24 MED ORDER — BUTAMBEN-TETRACAINE-BENZOCAINE 2-2-14 % EX AERO
INHALATION_SPRAY | CUTANEOUS | Status: DC | PRN
  Administered 2011-08-24: 2 via TOPICAL

## 2011-08-24 MED ORDER — MEPERIDINE HCL 25 MG/ML IJ SOLN
INTRAMUSCULAR | Status: DC | PRN
  Administered 2011-08-24 (×2): 25 mg via INTRAVENOUS

## 2011-08-24 MED ORDER — MEPERIDINE HCL 50 MG/ML IJ SOLN
INTRAMUSCULAR | Status: AC
  Filled 2011-08-24: qty 1

## 2011-08-24 MED ORDER — MIDAZOLAM HCL 5 MG/5ML IJ SOLN
INTRAMUSCULAR | Status: DC | PRN
  Administered 2011-08-24 (×4): 2 mg via INTRAVENOUS

## 2011-08-24 MED ORDER — STERILE WATER FOR IRRIGATION IR SOLN
Status: DC | PRN
  Administered 2011-08-24: 08:00:00

## 2011-08-24 NOTE — Op Note (Signed)
EGD PROCEDURE REPORT  PATIENT:  Tina [name]  MR#:  [id number] Birthdate:  Feb 17, 1934, 78 y.o., female Endoscopist:  Dr. Malissa Hippo, MD Referred By:  Dr. Colette Ribas, MD Procedure Date: 08/24/2011  Procedure:   EGD with ED.  Indications:  Patient is 78 year old Caucasian female with chronic GERD complicated by short segment Barrett's esophagus who presents with solid food dysphagia. Patient's last esophageal dilation was in 2009.            Informed Consent:  The risks, benefits, alternatives & imponderables which include, but are not limited to, bleeding, infection, perforation, drug reaction and potential missed lesion have been reviewed.  The potential for biopsy, lesion removal, esophageal dilation, etc. have also been discussed.  Questions have been answered.  All parties agreeable.  Please see history & physical in medical record for more information.  Medications:  Demerol 50 mg IV Versed 8 mg IV Cetacaine spray topically for oropharyngeal anesthesia  Description of procedure:  The endoscope was introduced through the mouth and advanced to the second portion of the duodenum without difficulty or limitations. The mucosal surfaces were surveyed very carefully during advancement of the scope and upon withdrawal.  Findings:  Esophagus:  Mucosa of the esophagus was normal except single small linear erosion proximal to GE junction. Few patches of salmon-colored mucosa noted proximal to GE junction. Largest patch is about 6 x 8 mm. GEJ:  36 cm Hiatus:  39 cm Stomach:  Stomach was empty and distended very well with insufflation. Folds in the proximal stomach were normal. Examination of mucosa at body and antrum was normal. There was erythema to pyloric channel but no erosions or ulcers were noted. Angularis fundus and cardia was examined by retroflexing the scope and were normal. Duodenum:  Normal bulbar and post bulbar mucosa.  Therapeutic/Diagnostic Maneuvers Performed:   Esophagus dilated by passing 54 French Maloney dilator to full insertion. Endoscope was passed again and mucosa reexamined and no disruption noted. Biopsy taken from patches of salmon-colored mucosa in endoscope was withdrawn.  Complications:  None  Impression: Single erosion at distal esophagus but no evidence of ring or stricture. Short segment Barrett's esophagus unchanged since previous exam. Biopsy taken for routine histology. Pyloric channel inflammation without ulcer or stricture. Esophagus was dilated by passing 54 French Maloney dilator to mucosal biopsy.  Recommendations:  Standard instructions given. I will be contacting patient with biopsy results.  Jaylia Pettus U  08/24/2011  8:51 AM  CC: Dr. Colette Ribas, MD, MD & Dr. Bonnetta Barry ref. provider found

## 2011-08-24 NOTE — Discharge Instructions (Signed)
Resume usual medications and diet. No driving for 24 hours. Physician will contact you with results of biopsy.

## 2011-08-24 NOTE — H&P (Signed)
Tina [name] is an 78 y.o. female.   Chief Complaint: Patient is here for EGD and ED. HPI: Patient is a [age]-year-old Caucasian female with history of scleroderma and chronic GERD complicated by short segment Barrett's esophagus who presents with dysphagia to solids. Start few months ago. She had her esophagus dilated every couple of years. Most recently 2009. She has good appetite and has not lost any weight.  Past Medical History  Diagnosis Date  . Lower abdominal pain   . Chronic diarrhea   . Barrett's esophagus   . Irritable bowel syndrome   . Helicobacter pylori (H. pylori)   . Gastritis   . Diabetes mellitus   . Hypertension   . Sleep apnea     pt is not on machine-diagnosed many years ago    Past Surgical History  Procedure Date  . Esophagogastroduodenoscopy 06/09  . Colonoscopy 12/2008  . Patella fracture surgery   . Esophagogastroduodenoscopy 02/02/05    EGD ED  . Esophagogastroduodenoscopy 03/08/00    EGD ED  . Cholecystectomy   . Lung surgery 2002    Lung collapsed    History reviewed. No pertinent family history. Social History:  reports that she has never smoked. She does not have any smokeless tobacco history on file. She reports that she does not drink alcohol or use illicit drugs.  Allergies: Not on File  Medications Prior to Admission  Medication Sig Dispense Refill  . allopurinol (ZYLOPRIM) 100 MG tablet Take 50 mg by mouth every morning.       Marland Kitchen aspirin EC 81 MG tablet Take 81 mg by mouth daily.      . citalopram (CELEXA) 20 MG tablet Take 20 mg by mouth at bedtime.       Marland Kitchen diltiazem (CARDIZEM CD) 180 MG 24 hr capsule Take 180 mg by mouth 2 (two) times daily.       Marland Kitchen esomeprazole (NEXIUM) 40 MG capsule Take 40 mg by mouth at bedtime.       . hydrochlorothiazide 25 MG tablet Take 25 mg by mouth every morning.       Marland Kitchen lisinopril (PRINIVIL,ZESTRIL) 10 MG tablet Take 10 mg by mouth every morning.       . metoprolol tartrate (LOPRESSOR) 25 MG tablet Take  12.5 mg by mouth 2 (two) times daily.       . ranitidine (ZANTAC) 150 MG tablet Take 150 mg by mouth every morning.      . simvastatin (ZOCOR) 40 MG tablet Take 40 mg by mouth at bedtime.        . sitaGLIPtin (JANUVIA) 100 MG tablet Take 100 mg by mouth every morning.       . sucralfate (CARAFATE) 1 GM/10ML suspension Take 1 g by mouth 4 (four) times daily.      Marland Kitchen zolpidem (AMBIEN) 10 MG tablet Take 10 mg by mouth at bedtime as needed. For sleep      . ciprofloxacin (CIPRO) 500 MG tablet Take 500 mg by mouth 2 (two) times daily. For a 7 day course      . Diphenhyd-Hydrocort-Nystatin SUSP Use as directed 5 mLs in the mouth or throat as directed. Swish and swallow        No results found for this or any previous visit (from the past 48 hour(s)). No results found.  Review of Systems  Constitutional: Negative for weight loss.  Gastrointestinal: Negative for abdominal pain, diarrhea, constipation, blood in stool and melena.    Blood pressure 134/60,  pulse 75, temperature 98.4 F (36.9 C), temperature source Oral, resp. rate 16, height 5\' 8"  (1.727 m), weight 175 lb (79.379 kg), SpO2 94.00%. Physical Exam  Constitutional: She appears well-developed and well-nourished.  HENT:  Mouth/Throat: Oropharynx is clear and moist.  Eyes: Conjunctivae are normal. No scleral icterus.  Neck: No thyromegaly present.  Cardiovascular: Normal rate, regular rhythm and normal heart sounds.   No murmur heard. Respiratory: Effort normal and breath sounds normal.  GI: Soft. She exhibits no distension and no mass. There is no tenderness.  Musculoskeletal: She exhibits no edema.  Lymphadenopathy:    She has no cervical adenopathy.  Neurological: She is alert.  Skin: Skin is warm.    she has deformities of both hands with extension of MCPs and flexion of DIPs  Assessment/Plan Chronic GERD complicated by short segment Barrett's esophagus in a patient with scleroderma Solid food dysphagia. EGD with ED and  possible esophageal biopsy.  Tina [name] U 08/24/2011, 8:21 AM

## 2011-08-27 ENCOUNTER — Encounter (HOSPITAL_COMMUNITY): Payer: Self-pay | Admitting: *Deleted

## 2011-08-27 ENCOUNTER — Emergency Department (HOSPITAL_COMMUNITY)
Admission: EM | Admit: 2011-08-27 | Discharge: 2011-08-27 | Disposition: A | Discharge status: Home / Self Care | Payer: Medicare Other | Attending: Emergency Medicine | Admitting: Emergency Medicine

## 2011-08-27 DIAGNOSIS — M349 Systemic sclerosis, unspecified: Secondary | ICD-10-CM | POA: Diagnosis not present

## 2011-08-27 DIAGNOSIS — J4489 Other specified chronic obstructive pulmonary disease: Secondary | ICD-10-CM | POA: Insufficient documentation

## 2011-08-27 DIAGNOSIS — R4182 Altered mental status, unspecified: Secondary | ICD-10-CM | POA: Insufficient documentation

## 2011-08-27 DIAGNOSIS — E119 Type 2 diabetes mellitus without complications: Secondary | ICD-10-CM | POA: Diagnosis not present

## 2011-08-27 DIAGNOSIS — I7300 Raynaud's syndrome without gangrene: Secondary | ICD-10-CM | POA: Insufficient documentation

## 2011-08-27 DIAGNOSIS — R413 Other amnesia: Secondary | ICD-10-CM

## 2011-08-27 DIAGNOSIS — J449 Chronic obstructive pulmonary disease, unspecified: Secondary | ICD-10-CM | POA: Diagnosis not present

## 2011-08-27 DIAGNOSIS — Z79899 Other long term (current) drug therapy: Secondary | ICD-10-CM | POA: Diagnosis not present

## 2011-08-27 DIAGNOSIS — I10 Essential (primary) hypertension: Secondary | ICD-10-CM | POA: Insufficient documentation

## 2011-08-27 HISTORY — DX: Raynaud's syndrome without gangrene: I73.00

## 2011-08-27 HISTORY — DX: Chronic obstructive pulmonary disease, unspecified: J44.9

## 2011-08-27 LAB — CBC
HCT: 36.4 % (ref 36.0–46.0)
Hemoglobin: 12 g/dL (ref 12.0–15.0)
MCH: 30.8 pg (ref 26.0–34.0)
MCHC: 33 g/dL (ref 30.0–36.0)
RBC: 3.9 MIL/uL (ref 3.87–5.11)

## 2011-08-27 LAB — DIFFERENTIAL
Basophils Relative: 1 % (ref 0–1)
Lymphs Abs: 5.2 10*3/uL — ABNORMAL HIGH (ref 0.7–4.0)
Monocytes Absolute: 1.1 10*3/uL — ABNORMAL HIGH (ref 0.1–1.0)
Monocytes Relative: 9 % (ref 3–12)
Neutro Abs: 5.5 10*3/uL (ref 1.7–7.7)

## 2011-08-27 LAB — URINALYSIS, ROUTINE W REFLEX MICROSCOPIC
Bilirubin Urine: NEGATIVE
Glucose, UA: NEGATIVE mg/dL
Hgb urine dipstick: NEGATIVE
Specific Gravity, Urine: 1.01 (ref 1.005–1.030)
Urobilinogen, UA: 0.2 mg/dL (ref 0.0–1.0)

## 2011-08-27 LAB — BASIC METABOLIC PANEL
BUN: 17 mg/dL (ref 6–23)
Chloride: 99 mEq/L (ref 96–112)
GFR calc Af Amer: 54 mL/min — ABNORMAL LOW (ref >90)
Glucose, Bld: 86 mg/dL (ref 70–99)
Potassium: 3.4 mEq/L — ABNORMAL LOW (ref 3.5–5.1)

## 2011-08-27 NOTE — ED Provider Notes (Addendum)
History     CSN: [id number]  Arrival date & time 08/27/11  1620   First MD Initiated Contact with Patient 08/27/11 1801      Chief Complaint  Patient presents with  . Altered Mental Status    (Consider location/radiation/quality/duration/timing/severity/associated sxs/prior treatment) HPI Comments: Tina [name] is a 78 y.o. Female Was a poor historian. She is asymptomatic. Her daughter feels that she's been more forgetful than usual. There've been no noted fever, chills, nausea, vomiting, chest pain, or shortness of breath. There are  no known aggravating or palliative factors.  The history is provided by the patient.    Past Medical History  Diagnosis Date  . Lower abdominal pain   . Chronic diarrhea   . Barrett's esophagus   . Irritable bowel syndrome   . Helicobacter pylori (H. pylori)   . Gastritis   . Diabetes mellitus   . Hypertension   . Sleep apnea     pt is not on machine-diagnosed many years ago  . 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 Scleroderma   . COPD (chronic obstructive pulmonary disease)   . Raynaud's disease   . Renal disorder     Past Surgical History  Procedure Date  . Esophagogastroduodenoscopy 06/09  . Colonoscopy 12/2008  . Patella fracture surgery   . Esophagogastroduodenoscopy 02/02/05    EGD ED  . Esophagogastroduodenoscopy 03/08/00    EGD ED  . Cholecystectomy   . Lung surgery 2002    Lung collapsed  . Back surgery     History reviewed. No pertinent family history.  History  Substance Use Topics  . Smoking status: Former Games developer  . Smokeless tobacco: Not on file  . Alcohol Use: No    OB History    Grav Para Term Preterm Abortions TAB SAB Ect Mult Living                  Review of Systems  All other systems reviewed and are negative.    Allergies  Review of patient's allergies indicates no known allergies.  Home Medications   Current Outpatient Rx  Name Route Sig Dispense Refill  . ALLOPURINOL 100 MG PO TABS Oral Take  50 mg by mouth every morning.     . ASPIRIN EC 81 MG PO TBEC Oral Take 81 mg by mouth daily.    Marland Kitchen VITAMIN D PO Oral Take 1 tablet by mouth every morning.    Marland Kitchen CITALOPRAM HYDROBROMIDE 20 MG PO TABS Oral Take 20 mg by mouth at bedtime.     Marland Kitchen DILTIAZEM HCL ER COATED BEADS 180 MG PO CP24 Oral Take 180 mg by mouth 2 (two) times daily.     Marland Kitchen DIPHENHYD-HYDROCORT-NYSTATIN MT SUSP Mouth/Throat Use as directed 5 mLs in the mouth or throat as directed. Swish and swallow    . ESOMEPRAZOLE MAGNESIUM 40 MG PO CPDR Oral Take 40 mg by mouth at bedtime.     Marland Kitchen HYDROCHLOROTHIAZIDE 25 MG PO TABS Oral Take 25 mg by mouth every morning.     Marland Kitchen LISINOPRIL 10 MG PO TABS Oral Take 10 mg by mouth every morning.     Marland Kitchen METOPROLOL TARTRATE 25 MG PO TABS Oral Take 12.5 mg by mouth 2 (two) times daily.     Marland Kitchen RANITIDINE HCL 150 MG PO TABS Oral Take 150 mg by mouth every morning.    Marland Kitchen SIMVASTATIN 40 MG PO TABS Oral Take 40 mg by mouth at bedtime.      Marland Kitchen SITAGLIPTIN  PHOSPHATE 100 MG PO TABS Oral Take 100 mg by mouth every morning.     . SUCRALFATE 1 GM/10ML PO SUSP Oral Take 1 g by mouth 4 (four) times daily.    Marland Kitchen ZOLPIDEM TARTRATE 10 MG PO TABS Oral Take 10 mg by mouth at bedtime as needed. For sleep      BP 141/71  Pulse 71  Temp 98.3 F (36.8 C) (Oral)  Resp 18  Ht 5\' 8"  (1.727 m)  Wt 177 lb (80.287 kg)  BMI 26.91 kg/m2  SpO2 96%  Physical Exam  Nursing note and vitals reviewed. Constitutional: She is oriented to person, place, and time. She appears well-developed and well-nourished.  HENT:  Head: Normocephalic and atraumatic.  Eyes: Conjunctivae and EOM are normal. Pupils are equal, round, and reactive to light.  Neck: Normal range of motion and phonation normal. Neck supple.  Cardiovascular: Normal rate, regular rhythm and intact distal pulses.   Pulmonary/Chest: Effort normal and breath sounds normal. She exhibits no tenderness.  Abdominal: Soft. She exhibits no distension. There is no tenderness. There is no  guarding.  Musculoskeletal: Normal range of motion.  Neurological: She is alert and oriented to person, place, and time. She has normal strength. She exhibits normal muscle tone.  Skin: Skin is warm and dry.  Psychiatric: She has a normal mood and affect. Her behavior is normal.    ED Course  Procedures (including critical care time)  Labs Reviewed  BASIC METABOLIC PANEL - Abnormal; Notable for the following:    Potassium 3.4 (*)     Creatinine, Ser 1.11 (*)     GFR calc non Af Amer 47 (*)     GFR calc Af Amer 54 (*)     All other components within normal limits  CBC - Abnormal; Notable for the following:    WBC 12.1 (*)     All other components within normal limits  DIFFERENTIAL - Abnormal; Notable for the following:    Lymphs Abs 5.2 (*)     Monocytes Absolute 1.1 (*)     All other components within normal limits  URINALYSIS, ROUTINE W REFLEX MICROSCOPIC  URINE CULTURE  LAB REPORT - SCANNED   No results found.   1. Memory change       MDM  Non-specific mild, memory loss, likely age related. Doubt urinary tract infection, metabolic instability, meningitis, CVA, and delirium. She is stable for discharge with outpatient management.  Plan: Home Medications- usual; Home Treatments- rest; Recommended follow up- PCP in 2 days to discuss further memory loss evaluation        Flint Melter, MD 08/27/11 2226  Flint Melter, MD 10/09/11 2057

## 2011-08-27 NOTE — ED Notes (Signed)
Daughter says pt is forgetful x 1 month .  This usually happens with UTI,  Has finished antibiotic but still forgetful.and advised by Dr Phillips Odor to come to ER for eval.

## 2011-08-27 NOTE — Discharge Instructions (Signed)
Tests today, did not reveal a urinary tract infection, dehydration, blood disorders, or evidence for stroke. Please see your primary care Dr. to be further evaluated for every changes. Get plenty of rest, drink a lot of fluids, take your medications as prescribed. Return here if needed for problems.

## 2011-08-28 LAB — URINE CULTURE
Colony Count: NO GROWTH
Culture  Setup Time: 201305210151
Culture: NO GROWTH

## 2011-09-04 ENCOUNTER — Encounter (INDEPENDENT_AMBULATORY_CARE_PROVIDER_SITE_OTHER): Payer: Self-pay | Admitting: *Deleted

## 2011-10-12 ENCOUNTER — Other Ambulatory Visit (HOSPITAL_COMMUNITY): Payer: Self-pay | Admitting: Family Medicine

## 2011-10-12 ENCOUNTER — Ambulatory Visit (HOSPITAL_COMMUNITY)
Admission: RE | Admit: 2011-10-12 | Discharge: 2011-10-12 | Disposition: A | Discharge status: Home / Self Care | Payer: Medicare Other | Source: Ambulatory Visit | Attending: Family Medicine | Admitting: Family Medicine

## 2011-10-12 DIAGNOSIS — R079 Chest pain, unspecified: Secondary | ICD-10-CM | POA: Diagnosis not present

## 2011-10-12 DIAGNOSIS — R3919 Other difficulties with micturition: Secondary | ICD-10-CM | POA: Diagnosis not present

## 2011-10-12 DIAGNOSIS — N390 Urinary tract infection, site not specified: Secondary | ICD-10-CM | POA: Diagnosis not present

## 2011-10-12 DIAGNOSIS — W19XXXA Unspecified fall, initial encounter: Secondary | ICD-10-CM | POA: Insufficient documentation

## 2011-10-12 DIAGNOSIS — Z6827 Body mass index (BMI) 27.0-27.9, adult: Secondary | ICD-10-CM | POA: Diagnosis not present

## 2011-11-01 DIAGNOSIS — R413 Other amnesia: Secondary | ICD-10-CM | POA: Diagnosis not present

## 2011-11-01 DIAGNOSIS — R4189 Other symptoms and signs involving cognitive functions and awareness: Secondary | ICD-10-CM | POA: Diagnosis not present

## 2011-11-01 DIAGNOSIS — R0989 Other specified symptoms and signs involving the circulatory and respiratory systems: Secondary | ICD-10-CM | POA: Diagnosis not present

## 2011-11-01 DIAGNOSIS — G609 Hereditary and idiopathic neuropathy, unspecified: Secondary | ICD-10-CM | POA: Diagnosis not present

## 2011-11-07 DIAGNOSIS — I6529 Occlusion and stenosis of unspecified carotid artery: Secondary | ICD-10-CM | POA: Diagnosis not present

## 2011-11-07 DIAGNOSIS — R0989 Other specified symptoms and signs involving the circulatory and respiratory systems: Secondary | ICD-10-CM | POA: Diagnosis not present

## 2011-11-17 DIAGNOSIS — I10 Essential (primary) hypertension: Secondary | ICD-10-CM | POA: Diagnosis not present

## 2011-11-17 DIAGNOSIS — N390 Urinary tract infection, site not specified: Secondary | ICD-10-CM | POA: Diagnosis not present

## 2011-11-19 ENCOUNTER — Other Ambulatory Visit (HOSPITAL_COMMUNITY): Payer: Self-pay | Admitting: Family Medicine

## 2011-11-19 DIAGNOSIS — Z139 Encounter for screening, unspecified: Secondary | ICD-10-CM

## 2011-11-22 ENCOUNTER — Other Ambulatory Visit (HOSPITAL_COMMUNITY): Payer: Medicare Other

## 2012-01-21 DIAGNOSIS — Z23 Encounter for immunization: Secondary | ICD-10-CM | POA: Diagnosis not present

## 2012-01-29 ENCOUNTER — Emergency Department (HOSPITAL_COMMUNITY)
Admission: EM | Admit: 2012-01-29 | Discharge: 2012-01-29 | Disposition: A | Discharge status: Home / Self Care | Payer: Medicare Other | Attending: Emergency Medicine | Admitting: Emergency Medicine

## 2012-01-29 ENCOUNTER — Encounter (HOSPITAL_COMMUNITY): Payer: Self-pay

## 2012-01-29 DIAGNOSIS — Z862 Personal history of diseases of the blood and blood-forming organs and certain disorders involving the immune mechanism: Secondary | ICD-10-CM | POA: Diagnosis not present

## 2012-01-29 DIAGNOSIS — Z87448 Personal history of other diseases of urinary system: Secondary | ICD-10-CM | POA: Diagnosis not present

## 2012-01-29 DIAGNOSIS — Z8739 Personal history of other diseases of the musculoskeletal system and connective tissue: Secondary | ICD-10-CM | POA: Diagnosis not present

## 2012-01-29 DIAGNOSIS — Z8781 Personal history of (healed) traumatic fracture: Secondary | ICD-10-CM | POA: Insufficient documentation

## 2012-01-29 DIAGNOSIS — Z87891 Personal history of nicotine dependence: Secondary | ICD-10-CM | POA: Diagnosis not present

## 2012-01-29 DIAGNOSIS — Z8619 Personal history of other infectious and parasitic diseases: Secondary | ICD-10-CM | POA: Insufficient documentation

## 2012-01-29 DIAGNOSIS — Z7982 Long term (current) use of aspirin: Secondary | ICD-10-CM | POA: Diagnosis not present

## 2012-01-29 DIAGNOSIS — Z8719 Personal history of other diseases of the digestive system: Secondary | ICD-10-CM | POA: Diagnosis not present

## 2012-01-29 DIAGNOSIS — Z8673 Personal history of transient ischemic attack (TIA), and cerebral infarction without residual deficits: Secondary | ICD-10-CM | POA: Insufficient documentation

## 2012-01-29 DIAGNOSIS — M25569 Pain in unspecified knee: Secondary | ICD-10-CM | POA: Diagnosis not present

## 2012-01-29 DIAGNOSIS — Z9889 Other specified postprocedural states: Secondary | ICD-10-CM | POA: Insufficient documentation

## 2012-01-29 DIAGNOSIS — E119 Type 2 diabetes mellitus without complications: Secondary | ICD-10-CM | POA: Diagnosis not present

## 2012-01-29 DIAGNOSIS — G4730 Sleep apnea, unspecified: Secondary | ICD-10-CM | POA: Diagnosis not present

## 2012-01-29 DIAGNOSIS — J449 Chronic obstructive pulmonary disease, unspecified: Secondary | ICD-10-CM | POA: Diagnosis not present

## 2012-01-29 DIAGNOSIS — Z79899 Other long term (current) drug therapy: Secondary | ICD-10-CM | POA: Diagnosis not present

## 2012-01-29 DIAGNOSIS — I10 Essential (primary) hypertension: Secondary | ICD-10-CM | POA: Insufficient documentation

## 2012-01-29 DIAGNOSIS — Z8639 Personal history of other endocrine, nutritional and metabolic disease: Secondary | ICD-10-CM | POA: Insufficient documentation

## 2012-01-29 DIAGNOSIS — J4489 Other specified chronic obstructive pulmonary disease: Secondary | ICD-10-CM | POA: Insufficient documentation

## 2012-01-29 HISTORY — DX: Cerebral infarction, unspecified: I63.9

## 2012-01-29 HISTORY — DX: Gout, unspecified: M10.9

## 2012-01-29 MED ORDER — OXYCODONE-ACETAMINOPHEN 5-325 MG PO TABS: 1.0000 | ORAL_TABLET | ORAL | Status: DC | PRN

## 2012-01-29 MED ORDER — CEPHALEXIN 500 MG PO CAPS
500.0000 mg | ORAL_CAPSULE | Freq: Four times a day (QID) | ORAL | Status: DC
Start: 2012-01-29 — End: 2013-05-05

## 2012-01-29 MED ORDER — COLCHICINE 0.6 MG PO TABS: 0.6000 mg | ORAL_TABLET | Freq: Two times a day (BID) | ORAL | Status: DC | PRN

## 2012-01-29 NOTE — ED Notes (Signed)
Discharge instructions reviewed with pt, questions answered. Pt verbalized understanding.  

## 2012-01-29 NOTE — ED Provider Notes (Signed)
History    Old female with right knee pain. Atraumatic. Onset was this morning. Gradual onset. Redness in the same area. Pain is a mild ache constant. No significant change range of motion. Healing at her baseline. Patient has a history of gout but never in her knee before. No fevers or chills. No history of diabetes. No acute numbness, tingling or loss of strength. CSN: [id number]  Arrival date & time 01/29/12  9562   First MD Initiated Contact with Patient 01/29/12 1923      Chief Complaint  Patient presents with  . Knee Pain    (Consider location/radiation/quality/duration/timing/severity/associated sxs/prior treatment) HPI  Past Medical History  Diagnosis Date  . Lower abdominal pain   . Chronic diarrhea   . Barrett's esophagus   . Irritable bowel syndrome   . Helicobacter pylori (H. pylori)   . Gastritis   . Diabetes mellitus   . Hypertension   . Sleep apnea     pt is not on machine-diagnosed many years ago  . 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 Scleroderma   . COPD (chronic obstructive pulmonary disease)   . Raynaud's disease   . Renal disorder   . Gout   . Stroke     Past Surgical History  Procedure Date  . Esophagogastroduodenoscopy 06/09  . Colonoscopy 12/2008  . Patella fracture surgery   . Esophagogastroduodenoscopy 02/02/05    EGD ED  . Esophagogastroduodenoscopy 03/08/00    EGD ED  . Cholecystectomy   . Lung surgery 2002    Lung collapsed  . Back surgery     No family history on file.  History  Substance Use Topics  . Smoking status: Former Games developer  . Smokeless tobacco: Not on file  . Alcohol Use: No    OB History    Grav Para Term Preterm Abortions TAB SAB Ect Mult Living                  Review of Systems   Review of symptoms negative unless otherwise noted in HPI.   Allergies  Review of patient's allergies indicates no known allergies.  Home Medications   Current Outpatient Rx  Name Route Sig Dispense Refill  . ALLOPURINOL 100 MG  PO TABS Oral Take 50 mg by mouth every morning.     . ASPIRIN EC 81 MG PO TBEC Oral Take 81 mg by mouth daily.    Marland Kitchen VITAMIN D PO Oral Take 1 tablet by mouth every morning.    Marland Kitchen CITALOPRAM HYDROBROMIDE 20 MG PO TABS Oral Take 20 mg by mouth at bedtime.     Marland Kitchen DILTIAZEM HCL ER COATED BEADS 180 MG PO CP24 Oral Take 180 mg by mouth 2 (two) times daily.     Marland Kitchen DIPHENHYD-HYDROCORT-NYSTATIN MT SUSP Mouth/Throat Use as directed 5 mLs in the mouth or throat as directed. Swish and swallow    . ESOMEPRAZOLE MAGNESIUM 40 MG PO CPDR Oral Take 40 mg by mouth at bedtime.     Marland Kitchen HYDROCHLOROTHIAZIDE 25 MG PO TABS Oral Take 25 mg by mouth every morning.     Marland Kitchen LISINOPRIL 10 MG PO TABS Oral Take 10 mg by mouth every morning.     Marland Kitchen METOPROLOL TARTRATE 25 MG PO TABS Oral Take 12.5 mg by mouth 2 (two) times daily.     Marland Kitchen RANITIDINE HCL 150 MG PO TABS Oral Take 150 mg by mouth every morning.    Marland Kitchen SIMVASTATIN 40 MG PO TABS Oral Take 40 mg by  mouth at bedtime.      Marland Kitchen SITAGLIPTIN PHOSPHATE 100 MG PO TABS Oral Take 100 mg by mouth every morning.     . SUCRALFATE 1 GM/10ML PO SUSP Oral Take 1 g by mouth 4 (four) times daily.    Marland Kitchen ZOLPIDEM TARTRATE 10 MG PO TABS Oral Take 10 mg by mouth at bedtime as needed. For sleep      BP 117/85  Pulse 72  Temp 98.1 F (36.7 C) (Oral)  Resp 18  Ht 5\' 8"  (1.727 m)  Wt 174 lb (78.926 kg)  BMI 26.46 kg/m2  SpO2 100%  Physical Exam  Nursing note and vitals reviewed. Constitutional: She appears well-developed and well-nourished. No distress.  HENT:  Head: Normocephalic and atraumatic.  Eyes: Conjunctivae normal are normal. Right eye exhibits no discharge. Left eye exhibits no discharge.  Neck: Neck supple.  Cardiovascular: Normal rate, regular rhythm and normal heart sounds.  Exam reveals no gallop and no friction rub.   No murmur heard. Pulmonary/Chest: Effort normal and breath sounds normal. No respiratory distress.  Abdominal: Soft. She exhibits no distension. There is no  tenderness.  Musculoskeletal: She exhibits no edema and no tenderness.       Erythema anterior aspect of the right knee. No joint effusion appreciated. Mild local tenderness. No significant increase in pain w/ range of motion of the knee. Neurovascular intact distally. Lower extremities symmetric as compared to each other. No calf tenderness. Negative Homan's. No palpable cords.  Contractures of hands consistent with hx of scleroderma.   Neurological: She is alert.  Skin: Skin is warm and dry.  Psychiatric: She has a normal mood and affect. Her behavior is normal. Thought content normal.    ED Course  Procedures (including critical care time)  Labs Reviewed - No data to display No results found.   1. Knee pain       MDM  78yf with atraumatic R knee pain and mild erythema. Suspect gout. Cannot rule out cellulitis. Doubt septic joint. Exam not consistent with DVT. Plan pain meds and abx for possible infectious etiology. Return precautions discussed. Outpt fu.        Raeford Razor, MD 02/01/12 1109

## 2012-01-29 NOTE — ED Notes (Signed)
Pt ambulated to room from triage.  

## 2012-01-29 NOTE — ED Notes (Signed)
Right knee pain and redness, started this morning per pt.

## 2012-02-25 ENCOUNTER — Ambulatory Visit (INDEPENDENT_AMBULATORY_CARE_PROVIDER_SITE_OTHER): Payer: Medicare Other | Admitting: Internal Medicine

## 2012-05-12 DIAGNOSIS — Z6826 Body mass index (BMI) 26.0-26.9, adult: Secondary | ICD-10-CM | POA: Diagnosis not present

## 2012-05-12 DIAGNOSIS — J209 Acute bronchitis, unspecified: Secondary | ICD-10-CM | POA: Diagnosis not present

## 2012-05-12 DIAGNOSIS — J029 Acute pharyngitis, unspecified: Secondary | ICD-10-CM | POA: Diagnosis not present

## 2012-05-15 DIAGNOSIS — F0280 Dementia in other diseases classified elsewhere without behavioral disturbance: Secondary | ICD-10-CM | POA: Diagnosis not present

## 2012-05-15 DIAGNOSIS — G309 Alzheimer's disease, unspecified: Secondary | ICD-10-CM | POA: Diagnosis not present

## 2012-05-15 DIAGNOSIS — R4189 Other symptoms and signs involving cognitive functions and awareness: Secondary | ICD-10-CM | POA: Diagnosis not present

## 2012-05-15 DIAGNOSIS — F0390 Unspecified dementia without behavioral disturbance: Secondary | ICD-10-CM | POA: Diagnosis not present

## 2012-08-08 DIAGNOSIS — Z6826 Body mass index (BMI) 26.0-26.9, adult: Secondary | ICD-10-CM | POA: Diagnosis not present

## 2012-08-08 DIAGNOSIS — I10 Essential (primary) hypertension: Secondary | ICD-10-CM | POA: Diagnosis not present

## 2012-08-08 DIAGNOSIS — E119 Type 2 diabetes mellitus without complications: Secondary | ICD-10-CM | POA: Diagnosis not present

## 2012-08-08 DIAGNOSIS — F411 Generalized anxiety disorder: Secondary | ICD-10-CM | POA: Diagnosis not present

## 2012-08-08 DIAGNOSIS — K219 Gastro-esophageal reflux disease without esophagitis: Secondary | ICD-10-CM | POA: Diagnosis not present

## 2012-08-08 DIAGNOSIS — E785 Hyperlipidemia, unspecified: Secondary | ICD-10-CM | POA: Diagnosis not present

## 2012-09-05 DIAGNOSIS — N390 Urinary tract infection, site not specified: Secondary | ICD-10-CM | POA: Diagnosis not present

## 2012-09-05 DIAGNOSIS — E119 Type 2 diabetes mellitus without complications: Secondary | ICD-10-CM | POA: Diagnosis not present

## 2012-09-05 DIAGNOSIS — Z6826 Body mass index (BMI) 26.0-26.9, adult: Secondary | ICD-10-CM | POA: Diagnosis not present

## 2012-09-05 DIAGNOSIS — K219 Gastro-esophageal reflux disease without esophagitis: Secondary | ICD-10-CM | POA: Diagnosis not present

## 2012-09-29 DIAGNOSIS — R0989 Other specified symptoms and signs involving the circulatory and respiratory systems: Secondary | ICD-10-CM | POA: Diagnosis not present

## 2012-09-29 DIAGNOSIS — R0609 Other forms of dyspnea: Secondary | ICD-10-CM | POA: Diagnosis not present

## 2012-09-29 DIAGNOSIS — M349 Systemic sclerosis, unspecified: Secondary | ICD-10-CM | POA: Diagnosis not present

## 2012-09-29 DIAGNOSIS — J988 Other specified respiratory disorders: Secondary | ICD-10-CM | POA: Diagnosis not present

## 2012-11-22 DIAGNOSIS — R197 Diarrhea, unspecified: Secondary | ICD-10-CM | POA: Diagnosis not present

## 2012-11-22 DIAGNOSIS — Z6825 Body mass index (BMI) 25.0-25.9, adult: Secondary | ICD-10-CM | POA: Diagnosis not present

## 2013-01-24 DIAGNOSIS — Z23 Encounter for immunization: Secondary | ICD-10-CM | POA: Diagnosis not present

## 2013-02-12 DIAGNOSIS — H35039 Hypertensive retinopathy, unspecified eye: Secondary | ICD-10-CM | POA: Diagnosis not present

## 2013-05-05 ENCOUNTER — Encounter (HOSPITAL_COMMUNITY): Payer: Self-pay | Admitting: Emergency Medicine

## 2013-05-05 ENCOUNTER — Emergency Department (HOSPITAL_COMMUNITY): Payer: Medicare Other

## 2013-05-05 ENCOUNTER — Observation Stay (HOSPITAL_COMMUNITY)
Admission: EM | Admit: 2013-05-05 | Discharge: 2013-05-06 | Disposition: A | Discharge status: Home / Self Care | Payer: Medicare Other | Attending: Internal Medicine | Admitting: Internal Medicine

## 2013-05-05 DIAGNOSIS — K589 Irritable bowel syndrome without diarrhea: Secondary | ICD-10-CM | POA: Diagnosis not present

## 2013-05-05 DIAGNOSIS — G4730 Sleep apnea, unspecified: Secondary | ICD-10-CM | POA: Insufficient documentation

## 2013-05-05 DIAGNOSIS — E119 Type 2 diabetes mellitus without complications: Secondary | ICD-10-CM | POA: Diagnosis present

## 2013-05-05 DIAGNOSIS — Z7982 Long term (current) use of aspirin: Secondary | ICD-10-CM | POA: Insufficient documentation

## 2013-05-05 DIAGNOSIS — K2270 Barrett's esophagus without dysplasia: Secondary | ICD-10-CM | POA: Diagnosis not present

## 2013-05-05 DIAGNOSIS — E7800 Pure hypercholesterolemia, unspecified: Secondary | ICD-10-CM

## 2013-05-05 DIAGNOSIS — Z794 Long term (current) use of insulin: Secondary | ICD-10-CM | POA: Insufficient documentation

## 2013-05-05 DIAGNOSIS — I10 Essential (primary) hypertension: Secondary | ICD-10-CM | POA: Diagnosis not present

## 2013-05-05 DIAGNOSIS — Z87891 Personal history of nicotine dependence: Secondary | ICD-10-CM | POA: Diagnosis not present

## 2013-05-05 DIAGNOSIS — M109 Gout, unspecified: Secondary | ICD-10-CM | POA: Insufficient documentation

## 2013-05-05 DIAGNOSIS — Z9089 Acquired absence of other organs: Secondary | ICD-10-CM | POA: Diagnosis not present

## 2013-05-05 DIAGNOSIS — J4489 Other specified chronic obstructive pulmonary disease: Secondary | ICD-10-CM | POA: Insufficient documentation

## 2013-05-05 DIAGNOSIS — R1310 Dysphagia, unspecified: Secondary | ICD-10-CM

## 2013-05-05 DIAGNOSIS — J449 Chronic obstructive pulmonary disease, unspecified: Secondary | ICD-10-CM | POA: Diagnosis not present

## 2013-05-05 DIAGNOSIS — F0390 Unspecified dementia without behavioral disturbance: Secondary | ICD-10-CM | POA: Insufficient documentation

## 2013-05-05 DIAGNOSIS — R079 Chest pain, unspecified: Principal | ICD-10-CM | POA: Diagnosis present

## 2013-05-05 DIAGNOSIS — E785 Hyperlipidemia, unspecified: Secondary | ICD-10-CM | POA: Insufficient documentation

## 2013-05-05 DIAGNOSIS — Z9889 Other specified postprocedural states: Secondary | ICD-10-CM | POA: Diagnosis not present

## 2013-05-05 DIAGNOSIS — R61 Generalized hyperhidrosis: Secondary | ICD-10-CM

## 2013-05-05 DIAGNOSIS — R1319 Other dysphagia: Secondary | ICD-10-CM

## 2013-05-05 DIAGNOSIS — R072 Precordial pain: Secondary | ICD-10-CM | POA: Diagnosis not present

## 2013-05-05 DIAGNOSIS — M349 Systemic sclerosis, unspecified: Secondary | ICD-10-CM

## 2013-05-05 DIAGNOSIS — I7300 Raynaud's syndrome without gangrene: Secondary | ICD-10-CM

## 2013-05-05 LAB — COMPREHENSIVE METABOLIC PANEL
ALBUMIN: 4.1 g/dL (ref 3.5–5.2)
ALK PHOS: 94 U/L (ref 39–117)
ALT: 24 U/L (ref 0–35)
AST: 38 U/L — ABNORMAL HIGH (ref 0–37)
BILIRUBIN TOTAL: 0.5 mg/dL (ref 0.3–1.2)
BUN: 15 mg/dL (ref 6–23)
CHLORIDE: 98 meq/L (ref 96–112)
CO2: 26 meq/L (ref 19–32)
Calcium: 9.7 mg/dL (ref 8.4–10.5)
Creatinine, Ser: 0.8 mg/dL (ref 0.50–1.10)
GFR, EST AFRICAN AMERICAN: 80 mL/min — AB (ref >90)
GFR, EST NON AFRICAN AMERICAN: 69 mL/min — AB (ref >90)
GLUCOSE: 154 mg/dL — AB (ref 70–99)
POTASSIUM: 3.9 meq/L (ref 3.7–5.3)
Sodium: 140 mEq/L (ref 137–147)
Total Protein: 8.2 g/dL (ref 6.0–8.3)

## 2013-05-05 LAB — CBC
HEMATOCRIT: 37.7 % (ref 36.0–46.0)
HEMOGLOBIN: 12.6 g/dL (ref 12.0–15.0)
MCH: 31.9 pg (ref 26.0–34.0)
MCHC: 33.4 g/dL (ref 30.0–36.0)
MCV: 95.4 fL (ref 78.0–100.0)
Platelets: 281 10*3/uL (ref 150–400)
RBC: 3.95 MIL/uL (ref 3.87–5.11)
RDW: 14 % (ref 11.5–15.5)
WBC: 12.8 10*3/uL — AB (ref 4.0–10.5)

## 2013-05-05 LAB — PROTIME-INR
INR: 1.13 (ref 0.00–1.49)
PROTHROMBIN TIME: 14.3 s (ref 11.6–15.2)

## 2013-05-05 LAB — POCT I-STAT, CHEM 8
BUN: 14 mg/dL (ref 6–23)
CREATININE: 0.9 mg/dL (ref 0.50–1.10)
Calcium, Ion: 1.24 mmol/L (ref 1.13–1.30)
Chloride: 99 mEq/L (ref 96–112)
GLUCOSE: 155 mg/dL — AB (ref 70–99)
HCT: 42 % (ref 36.0–46.0)
HEMOGLOBIN: 14.3 g/dL (ref 12.0–15.0)
POTASSIUM: 3.8 meq/L (ref 3.7–5.3)
Sodium: 140 mEq/L (ref 137–147)
TCO2: 26 mmol/L (ref 0–100)

## 2013-05-05 LAB — POCT I-STAT TROPONIN I: Troponin i, poc: 0 ng/mL (ref 0.00–0.08)

## 2013-05-05 LAB — GLUCOSE, CAPILLARY
Glucose-Capillary: 117 mg/dL — ABNORMAL HIGH (ref 70–99)
Glucose-Capillary: 88 mg/dL (ref 70–99)

## 2013-05-05 LAB — TROPONIN I

## 2013-05-05 LAB — APTT: aPTT: 30 seconds (ref 24–37)

## 2013-05-05 MED ORDER — PANTOPRAZOLE SODIUM 40 MG PO TBEC
40.0000 mg | DELAYED_RELEASE_TABLET | Freq: Every day | ORAL | Status: DC
  Administered 2013-05-05 – 2013-05-06 (×2): 40 mg via ORAL
  Filled 2013-05-05 (×2): qty 1

## 2013-05-05 MED ORDER — INSULIN ASPART 100 UNIT/ML ~~LOC~~ SOLN: 0.0000 [IU] | Freq: Three times a day (TID) | SUBCUTANEOUS | Status: DC

## 2013-05-05 MED ORDER — METOPROLOL TARTRATE 25 MG PO TABS
12.5000 mg | ORAL_TABLET | Freq: Two times a day (BID) | ORAL | Status: DC
  Administered 2013-05-05 – 2013-05-06 (×3): 12.5 mg via ORAL
  Filled 2013-05-05 (×3): qty 1

## 2013-05-05 MED ORDER — LISINOPRIL 10 MG PO TABS
10.0000 mg | ORAL_TABLET | Freq: Every morning | ORAL | Status: DC
  Administered 2013-05-05: 10 mg via ORAL
  Filled 2013-05-05: qty 1

## 2013-05-05 MED ORDER — DONEPEZIL HCL 5 MG PO TABS
10.0000 mg | ORAL_TABLET | Freq: Every day | ORAL | Status: DC
  Administered 2013-05-05: 10 mg via ORAL
  Filled 2013-05-05: qty 2

## 2013-05-05 MED ORDER — SIMVASTATIN 20 MG PO TABS
40.0000 mg | ORAL_TABLET | Freq: Every day | ORAL | Status: DC
  Administered 2013-05-05: 40 mg via ORAL
  Filled 2013-05-05: qty 1
  Filled 2013-05-05: qty 2

## 2013-05-05 MED ORDER — REGADENOSON 0.4 MG/5ML IV SOLN
0.4000 mg | Freq: Once | INTRAVENOUS | Status: DC
  Filled 2013-05-05: qty 5

## 2013-05-05 MED ORDER — DILTIAZEM HCL ER COATED BEADS 180 MG PO CP24
180.0000 mg | ORAL_CAPSULE | Freq: Two times a day (BID) | ORAL | Status: DC
  Administered 2013-05-05 – 2013-05-06 (×3): 180 mg via ORAL
  Filled 2013-05-05 (×3): qty 1

## 2013-05-05 MED ORDER — SODIUM CHLORIDE 0.9 % IV SOLN: INTRAVENOUS | Status: DC

## 2013-05-05 MED ORDER — ONDANSETRON HCL 4 MG PO TABS
4.0000 mg | ORAL_TABLET | Freq: Four times a day (QID) | ORAL | Status: DC | PRN
Start: 2013-05-05 — End: 2013-05-06

## 2013-05-05 MED ORDER — ONDANSETRON HCL 4 MG/2ML IJ SOLN: 4.0000 mg | Freq: Four times a day (QID) | INTRAMUSCULAR | Status: DC | PRN

## 2013-05-05 MED ORDER — ASPIRIN EC 81 MG PO TBEC
81.0000 mg | DELAYED_RELEASE_TABLET | Freq: Every day | ORAL | Status: DC
  Administered 2013-05-06: 81 mg via ORAL
  Filled 2013-05-05: qty 1

## 2013-05-05 MED ORDER — ALLOPURINOL 100 MG PO TABS
100.0000 mg | ORAL_TABLET | Freq: Two times a day (BID) | ORAL | Status: DC
  Administered 2013-05-05 – 2013-05-06 (×3): 100 mg via ORAL
  Filled 2013-05-05 (×3): qty 1

## 2013-05-05 MED ORDER — TECHNETIUM TC 99M SESTAMIBI - CARDIOLITE: 30.0000 | Freq: Once | INTRAVENOUS | Status: DC | PRN

## 2013-05-05 MED ORDER — ACETAMINOPHEN 650 MG RE SUPP: 650.0000 mg | Freq: Four times a day (QID) | RECTAL | Status: DC | PRN

## 2013-05-05 MED ORDER — SUCRALFATE 1 GM/10ML PO SUSP
1.0000 g | Freq: Three times a day (TID) | ORAL | Status: DC
  Administered 2013-05-05 – 2013-05-06 (×5): 1 g via ORAL
  Filled 2013-05-05 (×5): qty 10

## 2013-05-05 MED ORDER — SODIUM CHLORIDE 0.9 % IJ SOLN
3.0000 mL | Freq: Two times a day (BID) | INTRAMUSCULAR | Status: DC
  Administered 2013-05-05 – 2013-05-06 (×2): 3 mL via INTRAVENOUS

## 2013-05-05 MED ORDER — ENOXAPARIN SODIUM 40 MG/0.4ML ~~LOC~~ SOLN
40.0000 mg | Freq: Every day | SUBCUTANEOUS | Status: DC
  Administered 2013-05-05 – 2013-05-06 (×2): 40 mg via SUBCUTANEOUS
  Filled 2013-05-05 (×2): qty 0.4

## 2013-05-05 MED ORDER — ACETAMINOPHEN 325 MG PO TABS
650.0000 mg | ORAL_TABLET | Freq: Four times a day (QID) | ORAL | Status: DC | PRN
  Administered 2013-05-05: 650 mg via ORAL
  Filled 2013-05-05: qty 2

## 2013-05-05 MED ORDER — ZOLPIDEM TARTRATE 5 MG PO TABS
5.0000 mg | ORAL_TABLET | Freq: Every day | ORAL | Status: DC
  Administered 2013-05-05: 5 mg via ORAL
  Filled 2013-05-05: qty 1

## 2013-05-05 MED ORDER — ONDANSETRON HCL 4 MG/2ML IJ SOLN: 4.0000 mg | Freq: Three times a day (TID) | INTRAMUSCULAR | Status: DC | PRN

## 2013-05-05 MED ORDER — LISINOPRIL 10 MG PO TABS
20.0000 mg | ORAL_TABLET | Freq: Every morning | ORAL | Status: DC
  Administered 2013-05-06: 20 mg via ORAL
  Filled 2013-05-05: qty 4
  Filled 2013-05-05: qty 2
  Filled 2013-05-05: qty 4

## 2013-05-05 MED ORDER — CITALOPRAM HYDROBROMIDE 20 MG PO TABS
20.0000 mg | ORAL_TABLET | Freq: Every day | ORAL | Status: DC
  Administered 2013-05-05: 20 mg via ORAL
  Filled 2013-05-05: qty 1

## 2013-05-05 MED ORDER — ASPIRIN 81 MG PO CHEW: 324.0000 mg | CHEWABLE_TABLET | Freq: Once | ORAL | Status: DC

## 2013-05-05 MED ORDER — NITROGLYCERIN 0.4 MG SL SUBL
0.4000 mg | SUBLINGUAL_TABLET | SUBLINGUAL | Status: DC | PRN
  Administered 2013-05-05: 0.4 mg via SUBLINGUAL
  Filled 2013-05-05: qty 25

## 2013-05-05 MED ORDER — ENOXAPARIN SODIUM 40 MG/0.4ML ~~LOC~~ SOLN: 40.0000 mg | SUBCUTANEOUS | Status: DC

## 2013-05-05 NOTE — Consult Note (Signed)
CARDIOLOGY CONSULT NOTE  Patient ID: Tina [name] MRN: [id number] DOB/AGE: 11-17-1933 80 y.o.  Admit date: 05/05/2013 Primary Physician Purvis Kilts, MD  Reason for Consultation: chest pain  HPI: The patient is a very pleasant 80 yr old woman with a h/o scleroderma, HTN, IDDM, hyperlipidemia, and Barrett's esophagus (s/p dilatation in 08/2011) who presented to the ED with chest pain which awoke her from sleep. Much of the history is obtained from the EMR as the patient is unable to recall details regarding the events leading up to her hospitalization. It was reportedly located in the bilateral inframammary region and associated with diaphoresis. She has had one normal serum troponin but no further ones have been reported. Her ECG did not reveal any diagnostic ST-T abnormalities. At present, she denies chest pain, shortness of breath, lightheadedness, dizziness, palpitations, and leg swelling. She denies a h/o MI and coronary angiography, although the patient admits to forgetfulness.    No Known Allergies  Current Facility-Administered Medications  Medication Dose Route Frequency Provider Last Rate Last Dose  . acetaminophen (TYLENOL) tablet 650 mg  650 mg Oral Q6H PRN Kinnie Feil, MD   650 mg at 05/05/13 1627   Or  . acetaminophen (TYLENOL) suppository 650 mg  650 mg Rectal Q6H PRN Kinnie Feil, MD      . allopurinol (ZYLOPRIM) tablet 100 mg  100 mg Oral BID Kinnie Feil, MD   100 mg at 05/05/13 1609  . [START ON 05/06/2013] aspirin EC tablet 81 mg  81 mg Oral Daily Kinnie Feil, MD      . citalopram (CELEXA) tablet 20 mg  20 mg Oral QHS Kinnie Feil, MD      . diltiazem (CARDIZEM CD) 24 hr capsule 180 mg  180 mg Oral BID Kinnie Feil, MD   180 mg at 05/05/13 1609  . donepezil (ARICEPT) tablet 10 mg  10 mg Oral QHS Kinnie Feil, MD      . enoxaparin (LOVENOX) injection 40 mg  40 mg Subcutaneous Daily Kinnie Feil, MD   40 mg at 05/05/13  1608  . insulin aspart (novoLOG) injection 0-9 Units  0-9 Units Subcutaneous TID WC Kinnie Feil, MD      . lisinopril (PRINIVIL,ZESTRIL) tablet 10 mg  10 mg Oral q morning - 10a Kinnie Feil, MD   10 mg at 05/05/13 1609  . metoprolol tartrate (LOPRESSOR) tablet 12.5 mg  12.5 mg Oral BID Kinnie Feil, MD   12.5 mg at 05/05/13 1608  . nitroGLYCERIN (NITROSTAT) SL tablet 0.4 mg  0.4 mg Sublingual Q5 min PRN Johnna Acosta, MD   0.4 mg at 05/05/13 0645  . ondansetron (ZOFRAN) tablet 4 mg  4 mg Oral Q6H PRN Kinnie Feil, MD       Or  . ondansetron (ZOFRAN) injection 4 mg  4 mg Intravenous Q6H PRN Kinnie Feil, MD      . pantoprazole (PROTONIX) EC tablet 40 mg  40 mg Oral Daily Kinnie Feil, MD   40 mg at 05/05/13 1609  . simvastatin (ZOCOR) tablet 40 mg  40 mg Oral QHS Kinnie Feil, MD      . sodium chloride 0.9 % injection 3 mL  3 mL Intravenous Q12H Kinnie Feil, MD      . sucralfate (CARAFATE) 1 GM/10ML suspension 1 g  1 g Oral TID AC & HS Kinnie Feil, MD  1 g at 05/05/13 1608  . zolpidem (AMBIEN) tablet 5 mg  5 mg Oral QHS Kinnie Feil, MD        Past Medical History  Diagnosis Date  . Lower abdominal pain   . Chronic diarrhea   . Barrett's esophagus   . Irritable bowel syndrome   . Helicobacter pylori (H. pylori)   . Gastritis   . Diabetes mellitus   . Hypertension   . Sleep apnea     pt is not on machine-diagnosed many years ago  . 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 Scleroderma   . COPD (chronic obstructive pulmonary disease)   . Raynaud's disease   . Renal disorder   . Gout   . Stroke     Past Surgical History  Procedure Laterality Date  . Esophagogastroduodenoscopy  06/09  . Colonoscopy  12/2008  . Patella fracture surgery    . Esophagogastroduodenoscopy  02/02/05    EGD ED  . Esophagogastroduodenoscopy  03/08/00    EGD ED  . Cholecystectomy    . Lung surgery  2002    Lung collapsed  . Back surgery      History   Social  History  . Marital Status: Widowed    Spouse Name: N/A    Number of Children: N/A  . Years of Education: N/A   Occupational History  . Not on file.   Social History Main Topics  . Smoking status: Former Research scientist (life sciences)  . Smokeless tobacco: Not on file  . Alcohol Use: No  . Drug Use: No  . Sexual Activity: Yes    Birth Control/ Protection: Post-menopausal   Other Topics Concern  . Not on file   Social History Narrative  . No narrative on file     History reviewed. No pertinent family history.   Prior to Admission medications   Medication Sig Start Date End Date Taking? Authorizing Provider  allopurinol (ZYLOPRIM) 100 MG tablet Take 100 mg by mouth 2 (two) times daily.    Yes Historical Provider, MD  aspirin EC 81 MG tablet Take 81 mg by mouth 2 (two) times daily.    Yes Historical Provider, MD  Cholecalciferol (VITAMIN D PO) Take 1 tablet by mouth every morning.   Yes Historical Provider, MD  citalopram (CELEXA) 20 MG tablet Take 20 mg by mouth at bedtime.    Yes Historical Provider, MD  diltiazem (CARTIA XT) 180 MG 24 hr capsule Take 180 mg by mouth 2 (two) times daily.   Yes Historical Provider, MD  donepezil (ARICEPT) 10 MG tablet Take 10 mg by mouth at bedtime.   Yes Historical Provider, MD  hydrochlorothiazide 25 MG tablet Take 25 mg by mouth every morning.    Yes Historical Provider, MD  lisinopril (PRINIVIL,ZESTRIL) 10 MG tablet Take 10 mg by mouth every morning.    Yes Historical Provider, MD  metoprolol tartrate (LOPRESSOR) 25 MG tablet Take 12.5 mg by mouth 2 (two) times daily.    Yes Historical Provider, MD  omeprazole (PRILOSEC) 40 MG capsule Take 40 mg by mouth daily.   Yes Historical Provider, MD  simvastatin (ZOCOR) 40 MG tablet Take 40 mg by mouth at bedtime.     Yes Historical Provider, MD  zolpidem (AMBIEN) 10 MG tablet Take 5 mg by mouth at bedtime. For sleep   Yes Historical Provider, MD  sucralfate (CARAFATE) 1 GM/10ML suspension Take 1 g by mouth 4 (four) times  daily.    Historical Provider, MD  Review of systems complete and found to be negative unless listed above in HPI     Physical exam Blood pressure 169/57, pulse 66, temperature 97.8 F (36.6 C), temperature source Oral, resp. rate 17, height 5\' 7"  (1.702 m), weight 160 lb 7.9 oz (72.8 kg), SpO2 100.00%. General: NAD Neck: No JVD, no thyromegaly or thyroid nodule.  Lungs: Clear to auscultation bilaterally with normal respiratory effort. CV: Nondisplaced PMI.  Heart regular S1/S2, no S3/S4, soft II/VI holosystolic murmur along left sternal border.  No peripheral edema.  No carotid bruit.  Normal pedal pulses.  Abdomen: Soft, nontender, no hepatosplenomegaly, no distention.  Skin: Intact without lesions or rashes. Tight, shiny appearance on both hands and feet indicative of scleroderma. Neurologic: Alert and oriented x 3.  Psych: Normal affect. Extremities: No clubbing or cyanosis.  HEENT: Normal.   Labs:   Lab Results  Component Value Date   WBC 12.8* 05/05/2013   HGB 14.3 05/05/2013   HCT 42.0 05/05/2013   MCV 95.4 05/05/2013   PLT 281 05/05/2013    Recent Labs Lab 05/05/13 0619 05/05/13 0627  NA 140 140  K 3.9 3.8  CL 98 99  CO2 26  --   BUN 15 14  CREATININE 0.80 0.90  CALCIUM 9.7  --   PROT 8.2  --   BILITOT 0.5  --   ALKPHOS 94  --   ALT 24  --   AST 38*  --   GLUCOSE 154* 155*   Lab Results  Component Value Date   TROPONINI <0.30 05/05/2013    No results found for this basename: CHOL   No results found for this basename: HDL   No results found for this basename: LDLCALC   No results found for this basename: TRIG   No results found for this basename: CHOLHDL   No results found for this basename: LDLDIRECT       EKG: Sinus rhythm, 1st degree AV block, PR 230 ms, LAFB  Studies: Dg Chest Portable 1 View  05/05/2013   CLINICAL DATA:  Nausea, chest pain.  EXAM: PORTABLE CHEST - 1 VIEW  COMPARISON:  Chest radiograph October 12, 2011  FINDINGS: Cardiac  silhouette is upper limits of normal in size. Mildly calcified aortic knob, mediastinal silhouette is unremarkable. Similar chronic mild interstitial changes. Postsurgical change of the right apical and mid lung zone. Suspected calcified pleural plaques on the right. No pneumothorax.  Soft tissue planes and included osseous structures are nonsuspicious.  IMPRESSION: Stable borderline cardiomegaly and chronic interstitial changes without superimposed acute pulmonary process.   Electronically Signed   By: Elon Alas   On: 05/05/2013 06:25    ASSESSMENT AND PLAN:  1. Chest pain: will check serial troponins to rule out for an ACS. If these are normal, given her multiple cardiac risk factors, will proceed with a Lexiscan Cardiolite on 1/28. Continue ASA, beta blocker, and statin for now. 2. HTN: uncontrolled. Will increase lisinopril to 20 mg daily. 3. Hyperlipidemia: will check a lipid panel. Continue simvastatin 40 mg qhs for now. 4. IDDM: HbA1C pending.   Signed: Kate Sable, M.D., F.A.C.C.  05/05/2013, 5:36 PM

## 2013-05-05 NOTE — H&P (Signed)
[facility] History and Physical  Tina [name] DOB: 02-28-1934 DOA: 05/05/2013  Referring physician:  PCP: Purvis Kilts, MD  Specialists:   Chief Complaint: chest pain   HPI: Tina [name] is a 80 y.o. female with PMH of HTN, HPL, COPD, dementia, h/p CVA, DM, scleroderma, esophogeal erosion presented with substernal chest pressure, associated with diaphoresis, relieved with NTG; denies SOB, no fever, no cough; reports mild nausea, no vomiting has chronic diarrhea;  -chest pain resolved in ED  Review of Systems: The patient denies anorexia, fever, weight loss,, vision loss, decreased hearing, hoarseness, syncope, dyspnea on exertion, peripheral edema, balance deficits, hemoptysis, abdominal pain, melena, hematochezia, severe indigestion/heartburn, hematuria, incontinence, genital sores, muscle weakness, suspicious skin lesions, transient blindness, difficulty walking, depression, unusual weight change, abnormal bleeding, enlarged lymph nodes, angioedema, and breast masses.   Past Medical History  Diagnosis Date  . Lower abdominal pain   . Chronic diarrhea   . Barrett's esophagus   . Irritable bowel syndrome   . Helicobacter pylori (H. pylori)   . Gastritis   . Diabetes mellitus   . Hypertension   . Sleep apnea     pt is not on machine-diagnosed many years ago  . 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 Scleroderma   . COPD (chronic obstructive pulmonary disease)   . Raynaud's disease   . Renal disorder   . Gout   . Stroke    Past Surgical History  Procedure Laterality Date  . Esophagogastroduodenoscopy  06/09  . Colonoscopy  12/2008  . Patella fracture surgery    . Esophagogastroduodenoscopy  02/02/05    EGD ED  . Esophagogastroduodenoscopy  03/08/00    EGD ED  . Cholecystectomy    . Lung surgery  2002    Lung collapsed  . Back surgery     Social History:  reports that she has quit smoking. She does not have any smokeless tobacco history on file. She  reports that she does not drink alcohol or use illicit drugs. Home;  where does patient live--home, ALF, SNF? and with whom if at home? Yes;  Can patient participate in ADLs?  No Known Allergies  No family history on file. denies CAD (be sure to complete)  Prior to Admission medications   Medication Sig Start Date End Date Taking? Authorizing Provider  allopurinol (ZYLOPRIM) 100 MG tablet Take 100 mg by mouth 2 (two) times daily.    Yes Historical Provider, MD  aspirin EC 81 MG tablet Take 81 mg by mouth 2 (two) times daily.    Yes Historical Provider, MD  Cholecalciferol (VITAMIN D PO) Take 1 tablet by mouth every morning.   Yes Historical Provider, MD  citalopram (CELEXA) 20 MG tablet Take 20 mg by mouth at bedtime.    Yes Historical Provider, MD  diltiazem (CARTIA XT) 180 MG 24 hr capsule Take 180 mg by mouth 2 (two) times daily.   Yes Historical Provider, MD  donepezil (ARICEPT) 10 MG tablet Take 10 mg by mouth at bedtime.   Yes Historical Provider, MD  hydrochlorothiazide 25 MG tablet Take 25 mg by mouth every morning.    Yes Historical Provider, MD  lisinopril (PRINIVIL,ZESTRIL) 10 MG tablet Take 10 mg by mouth every morning.    Yes Historical Provider, MD  metoprolol tartrate (LOPRESSOR) 25 MG tablet Take 12.5 mg by mouth 2 (two) times daily.    Yes Historical Provider, MD  omeprazole (PRILOSEC) 40 MG capsule Take 40 mg by mouth daily.  Yes Historical Provider, MD  simvastatin (ZOCOR) 40 MG tablet Take 40 mg by mouth at bedtime.     Yes Historical Provider, MD  zolpidem (AMBIEN) 10 MG tablet Take 5 mg by mouth at bedtime. For sleep   Yes Historical Provider, MD  sucralfate (CARAFATE) 1 GM/10ML suspension Take 1 g by mouth 4 (four) times daily.    Historical Provider, MD   Physical Exam: Filed Vitals:   05/05/13 0840  BP: 104/75  Pulse: 63  Temp: 98 F (36.7 C)  Resp: 16     General:  alert  Eyes: eom-i  ENT: no oral ulcers   Neck: supple   Cardiovascular: P3,A2  rrr-+systolic mr   Respiratory: CTA BL  Abdomen: soft, nt, nd   Skin: no rash  Musculoskeletal: no LE edema  Psychiatric: no hallucinations   Neurologic: CN 2-12 intact, motor 5/5 BL  Labs on Admission:  Basic Metabolic Panel:  Recent Labs Lab 05/05/13 0619 05/05/13 0627  NA 140 140  K 3.9 3.8  CL 98 99  CO2 26  --   GLUCOSE 154* 155*  BUN 15 14  CREATININE 0.80 0.90  CALCIUM 9.7  --    Liver Function Tests:  Recent Labs Lab 05/05/13 0619  AST 38*  ALT 24  ALKPHOS 94  BILITOT 0.5  PROT 8.2  ALBUMIN 4.1   No results found for this basename: LIPASE, AMYLASE,  in the last 168 hours No results found for this basename: AMMONIA,  in the last 168 hours CBC:  Recent Labs Lab 05/05/13 0619 05/05/13 0627  WBC 12.8*  --   HGB 12.6 14.3  HCT 37.7 42.0  MCV 95.4  --   PLT 281  --    Cardiac Enzymes: No results found for this basename: CKTOTAL, CKMB, CKMBINDEX, TROPONINI,  in the last 168 hours  BNP (last 3 results) No results found for this basename: PROBNP,  in the last 8760 hours CBG: No results found for this basename: GLUCAP,  in the last 168 hours  Radiological Exams on Admission: Dg Chest Portable 1 View  05/05/2013   CLINICAL DATA:  Nausea, chest pain.  EXAM: PORTABLE CHEST - 1 VIEW  COMPARISON:  Chest radiograph October 12, 2011  FINDINGS: Cardiac silhouette is upper limits of normal in size. Mildly calcified aortic knob, mediastinal silhouette is unremarkable. Similar chronic mild interstitial changes. Postsurgical change of the right apical and mid lung zone. Suspected calcified pleural plaques on the right. No pneumothorax.  Soft tissue planes and included osseous structures are nonsuspicious.  IMPRESSION: Stable borderline cardiomegaly and chronic interstitial changes without superimposed acute pulmonary process.   Electronically Signed   By: Elon Alas   On: 05/05/2013 06:25    EKG: Independently reviewed. Sinus brady, no acute ST, changes     Assessment/Plan Principal Problem:   Chest pain Active Problems:   Hypertension   COPD (chronic obstructive pulmonary disease)   Barrett's esophagus   HTN (hypertension)   DM (diabetes mellitus)  80 y.o. female with PMH of HTN, HPL, h/p CVA, DM, COPD, dementia, scleroderma, esophogeal erosion presented with substernal chest pressure  1. Chest pain atypical presentation but significant CAD risk factors; ECG, initial trop unremarkable; chest pain resolved;  -monitor on tele, serial ECG, trop; c/s cardiology may need stress test; cont BB, ASA, statin, ACE   2. DM not recent HA1c; per family not on meds due to poor appetite episodes of hypoglycemia;  -check a1c; ISS for now   3. HTN  cont home regimen; hold hctz;    4. GERD, h/o esophogeal erosion; cont PPI;   5. Dementia, h/o CVA; cont home regimen   Cardiology;  if consultant consulted, please document name and whether formally or informally consulted  Code Status: full (must indicate code status--if unknown or must be presumed, indicate so) Family Communication: d/w patient, her daughter  (indicate person spoken with, if applicable, with phone number if by telephone) Disposition Plan: home in 24-48 hours  (indicate anticipated LOS)  Time spent: >35 minutes  Kinnie Feil [facility] Pager [phone]  If 7PM-7AM, please contact night-coverage [email or web address] Password [facility] 05/05/2013, 8:51 AM

## 2013-05-05 NOTE — ED Notes (Signed)
Tina [name], pt daughter, [phone].

## 2013-05-05 NOTE — ED Notes (Signed)
Bedside commode 

## 2013-05-05 NOTE — ED Notes (Signed)
Holding on Nitro at present due to BP of 134/51

## 2013-05-05 NOTE — ED Notes (Signed)
Patient presents to ER via [facility] with c/o nausea.  Patient states she woke up with chest pain, so she sat in her chair.  Patient denies chest pain at this time, but states she is having nausea at this time.

## 2013-05-05 NOTE — ED Provider Notes (Signed)
CSN: [id number]     Arrival date & time 05/05/13  0534 History   First MD Initiated Contact with Patient 05/05/13 0542     Chief Complaint  Patient presents with  . Nausea   (Consider location/radiation/quality/duration/timing/severity/associated sxs/prior Treatment) HPI Comments: Chief complaint chest pain   80 year old female, history of hypertension, diabetes, multiple gastrointestinal illnesses, chronic diarrhea and systemic scleroderma. She presents to the hospital with a complaint of chest pain which occurred while she was in bed just prior to arrival, pain was substernal and left sternum, no radiation, she was found to be diaphoretic and pale in bed by a family member, paramedics were called and found the patient to be improved, no significant diaphoresis or chest pain on their arrival and at this time she complains only of nausea and a discomfort underneath her bilateral breasts. She states this is distinctly different than her acid reflux disease, she denies having a history of significant heart problems or heart attack though it is thought that she may have had a stroke last year.  Past Medical History  Diagnosis Date  . Lower abdominal pain   . Chronic diarrhea   . Barrett's esophagus   . Irritable bowel syndrome   . Helicobacter pylori (H. pylori)   . Gastritis   . Diabetes mellitus   . Hypertension   . Sleep apnea     pt is not on machine-diagnosed many years ago  . 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 Scleroderma   . COPD (chronic obstructive pulmonary disease)   . Raynaud's disease   . Renal disorder   . Gout   . Stroke    Past Surgical History  Procedure Laterality Date  . Esophagogastroduodenoscopy  06/09  . Colonoscopy  12/2008  . Patella fracture surgery    . Esophagogastroduodenoscopy  02/02/05    EGD ED  . Esophagogastroduodenoscopy  03/08/00    EGD ED  . Cholecystectomy    . Lung surgery  2002    Lung collapsed  . Back surgery     No family history on  file. History  Substance Use Topics  . Smoking status: Former Research scientist (life sciences)  . Smokeless tobacco: Not on file  . Alcohol Use: No   OB History   Grav Para Term Preterm Abortions TAB SAB Ect Mult Living                 Review of Systems  All other systems reviewed and are negative.    Allergies  Review of patient's allergies indicates no known allergies.  Home Medications   Current Outpatient Rx  Name  Route  Sig  Dispense  Refill  . allopurinol (ZYLOPRIM) 100 MG tablet   Oral   Take 50 mg by mouth every morning.          Marland Kitchen aspirin EC 81 MG tablet   Oral   Take 81 mg by mouth daily.         . cephALEXin (KEFLEX) 500 MG capsule   Oral   Take 1 capsule (500 mg total) by mouth 4 (four) times daily.   20 capsule   0   . Cholecalciferol (VITAMIN D PO)   Oral   Take 1 tablet by mouth every morning.         . citalopram (CELEXA) 20 MG tablet   Oral   Take 20 mg by mouth at bedtime.          . colchicine 0.6 MG tablet   Oral  Take 1 tablet (0.6 mg total) by mouth 2 (two) times daily as needed.   20 tablet   0   . diltiazem (CARDIZEM CD) 180 MG 24 hr capsule   Oral   Take 180 mg by mouth 2 (two) times daily.          . Diphenhyd-Hydrocort-Nystatin SUSP   Mouth/Throat   Use as directed 5 mLs in the mouth or throat as directed. Swish and swallow         . esomeprazole (NEXIUM) 40 MG capsule   Oral   Take 40 mg by mouth at bedtime.          . hydrochlorothiazide 25 MG tablet   Oral   Take 25 mg by mouth every morning.          Marland Kitchen lisinopril (PRINIVIL,ZESTRIL) 10 MG tablet   Oral   Take 10 mg by mouth every morning.          . metoprolol tartrate (LOPRESSOR) 25 MG tablet   Oral   Take 12.5 mg by mouth 2 (two) times daily.          Marland Kitchen oxyCODONE-acetaminophen (PERCOCET/ROXICET) 5-325 MG per tablet   Oral   Take 1 tablet by mouth every 4 (four) hours as needed for pain.   10 tablet   0   . ranitidine (ZANTAC) 150 MG tablet   Oral   Take  150 mg by mouth every morning.         . simvastatin (ZOCOR) 40 MG tablet   Oral   Take 40 mg by mouth at bedtime.           . sitaGLIPtin (JANUVIA) 100 MG tablet   Oral   Take 100 mg by mouth every morning.          . sucralfate (CARAFATE) 1 GM/10ML suspension   Oral   Take 1 g by mouth 4 (four) times daily.         Marland Kitchen zolpidem (AMBIEN) 10 MG tablet   Oral   Take 10 mg by mouth at bedtime as needed. For sleep          BP 134/51  Pulse 57  Resp 15  SpO2 95% Physical Exam  Nursing note and vitals reviewed. Constitutional: She appears well-developed and well-nourished. No distress.  HENT:  Head: Normocephalic and atraumatic.  Mouth/Throat: Oropharynx is clear and moist. No oropharyngeal exudate.  Eyes: Conjunctivae and EOM are normal. Pupils are equal, round, and reactive to light. Right eye exhibits no discharge. Left eye exhibits no discharge. No scleral icterus.  Neck: Normal range of motion. Neck supple. No JVD present. No thyromegaly present.  Cardiovascular: Normal rate, regular rhythm and intact distal pulses.  Exam reveals no gallop and no friction rub.   Murmur ( Systolic) heard. Pulmonary/Chest: Effort normal and breath sounds normal. No respiratory distress. She has no wheezes. She has no rales.  Abdominal: Soft. Bowel sounds are normal. She exhibits no distension and no mass. There is no tenderness.  Musculoskeletal: Normal range of motion. She exhibits no edema and no tenderness.  Lymphadenopathy:    She has no cervical adenopathy.  Neurological: She is alert. Coordination normal.  Skin: Skin is warm and dry. No rash noted. No erythema.  Psychiatric: She has a normal mood and affect. Her behavior is normal.    ED Course  Procedures (including critical care time) Labs Review Labs Reviewed  CBC - Abnormal; Notable for the following:    WBC  12.8 (*)    All other components within normal limits  COMPREHENSIVE METABOLIC PANEL - Abnormal; Notable for  the following:    Glucose, Bld 154 (*)    AST 38 (*)    GFR calc non Af Amer 69 (*)    GFR calc Af Amer 80 (*)    All other components within normal limits  POCT I-STAT, CHEM 8 - Abnormal; Notable for the following:    Glucose, Bld 155 (*)    All other components within normal limits  PROTIME-INR  APTT  POCT I-STAT TROPONIN I   Imaging Review Dg Chest Portable 1 View  05/05/2013   CLINICAL DATA:  Nausea, chest pain.  EXAM: PORTABLE CHEST - 1 VIEW  COMPARISON:  Chest radiograph October 12, 2011  FINDINGS: Cardiac silhouette is upper limits of normal in size. Mildly calcified aortic knob, mediastinal silhouette is unremarkable. Similar chronic mild interstitial changes. Postsurgical change of the right apical and mid lung zone. Suspected calcified pleural plaques on the right. No pneumothorax.  Soft tissue planes and included osseous structures are nonsuspicious.  IMPRESSION: Stable borderline cardiomegaly and chronic interstitial changes without superimposed acute pulmonary process.   Electronically Signed   By: Elon Alas   On: 05/05/2013 06:25   3151 EKG Interpretation    Date/Time:  Tuesday May 05 2013 05:47:59 EST Ventricular Rate:  61 PR Interval:  230 QRS Duration: 96 QT Interval:  476 QTC Calculation: 479 R Axis:   -32 Text Interpretation:  Sinus rhythm with 1st degree A-V block Left axis deviation Left anterior fasicular block Abnormal ECG No previous ECGs available Confirmed by Haivyn Oravec  MD, Brayln Duque ([id number]) on 05/05/2013 6:20:19 AM          ED ECG REPORT  I personally interpreted this EKG   Date: 05/05/2013 0636  Rate: 59  Rhythm: sinus bradycardia  QRS Axis: left  Intervals: normal  ST/T Wave abnormalities: normal  Conduction Disutrbances:left anterior fascicular block  Narrative Interpretation:   Old EKG Reviewed: unchanged   MDM   1. Chest pain    79, diabetes and hypertension as risk for acute coronary syndrome, currently symptoms have been minimal but  persistent, rhythm strips from paramedic evaluation have been reviewed, no acute ischemia found, will obtain EKG and labs as well as chest x-ray, patient likely needs to be admitted for observation and testing normal.  6:45 AM, troponin negative, EKG repeated because of the patient's recurrent chest pain and is unchanged, will admit the patient to the hospital for further evaluation and rule out. Discussed with hospitalist who agrees.    Johnna Acosta, MD 05/05/13 [phone]

## 2013-05-06 ENCOUNTER — Observation Stay (HOSPITAL_COMMUNITY): Payer: Medicare Other

## 2013-05-06 ENCOUNTER — Encounter (HOSPITAL_COMMUNITY): Payer: Self-pay

## 2013-05-06 DIAGNOSIS — E119 Type 2 diabetes mellitus without complications: Secondary | ICD-10-CM | POA: Diagnosis not present

## 2013-05-06 DIAGNOSIS — R079 Chest pain, unspecified: Secondary | ICD-10-CM | POA: Diagnosis not present

## 2013-05-06 DIAGNOSIS — K2270 Barrett's esophagus without dysplasia: Secondary | ICD-10-CM | POA: Diagnosis not present

## 2013-05-06 DIAGNOSIS — J449 Chronic obstructive pulmonary disease, unspecified: Secondary | ICD-10-CM | POA: Diagnosis not present

## 2013-05-06 LAB — TROPONIN I: Troponin I: <0.3 ng/mL (ref <0.30)

## 2013-05-06 LAB — HEMOGLOBIN A1C
Hgb A1c MFr Bld: 5.8 % — ABNORMAL HIGH (ref <5.7)
MEAN PLASMA GLUCOSE: 120 mg/dL — AB (ref <117)

## 2013-05-06 LAB — LIPID PANEL
CHOLESTEROL: 85 mg/dL (ref 0–200)
HDL: 35 mg/dL — ABNORMAL LOW (ref >39)
LDL CALC: 33 mg/dL (ref 0–99)
TRIGLYCERIDES: 85 mg/dL (ref <150)
Total CHOL/HDL Ratio: 2.4 RATIO
VLDL: 17 mg/dL (ref 0–40)

## 2013-05-06 LAB — GLUCOSE, CAPILLARY
GLUCOSE-CAPILLARY: 141 mg/dL — AB (ref 70–99)
GLUCOSE-CAPILLARY: 116 mg/dL — AB (ref 70–99)
Glucose-Capillary: 80 mg/dL (ref 70–99)

## 2013-05-06 MED ORDER — TECHNETIUM TC 99M SESTAMIBI - CARDIOLITE
10.0000 | Freq: Once | INTRAVENOUS | Status: AC | PRN
  Administered 2013-05-06: 10 via INTRAVENOUS

## 2013-05-06 MED ORDER — SODIUM CHLORIDE 0.9 % IJ SOLN
INTRAMUSCULAR | Status: AC
  Administered 2013-05-06: 10 mL via INTRAVENOUS
  Filled 2013-05-06: qty 10

## 2013-05-06 MED ORDER — TECHNETIUM TC 99M SESTAMIBI - CARDIOLITE
30.0000 | Freq: Once | INTRAVENOUS | Status: AC | PRN
  Administered 2013-05-06: 09:00:00 30 via INTRAVENOUS

## 2013-05-06 MED ORDER — REGADENOSON 0.4 MG/5ML IV SOLN
INTRAVENOUS | Status: AC
  Administered 2013-05-06: 0.4 mg via INTRAVENOUS
  Filled 2013-05-06: qty 5

## 2013-05-06 NOTE — Progress Notes (Signed)
Nutrition Brief Note  Patient identified on the Malnutrition Screening Tool (MST) Report  Wt Readings from Last 15 Encounters:  05/05/13 160 lb 7.9 oz (72.8 kg)  01/29/12 174 lb (78.926 kg)  08/27/11 177 lb (80.287 kg)  08/24/11 175 lb (79.379 kg)  08/24/11 175 lb (79.379 kg)  08/01/11 177 lb 8 oz (80.513 kg)    Body mass index is 25.13 kg/(m^2). Patient meets criteria for overweight based on current BMI. Weight loss not significant for time period.  Current diet order is  NPO. Labs and medications reviewed. No nutrition interventions warranted at this time.

## 2013-05-06 NOTE — Progress Notes (Signed)
Overall low risk MPI. No evidence of ACS by EKG or cardiac enzymes. Continue cardiac risk factor modificaiton. No further cardiac testing or interventions planned at this time. Patient may follow up with NP Purcell Nails in 2-3 weeks, will signoff inpatient care.   Carlyle Dolly MD

## 2013-05-06 NOTE — Progress Notes (Signed)
Stress Lab Nurses Notes - [facility]  Tina [name] 05/06/2013 Reason for doing test: Chest Pain Type of test: Wille Glaser Nurse performing test: Gerrit Halls, RN Nuclear Medicine Tech: Melburn Hake Echo Tech: Not Applicable MD performing test: Dr. Wardell Heath PA Family MD:  Dr. Hilma Favors Test explained and consent signed: yes IV started: 20g jelco, Saline lock flushed, No redness or edema and Saline lock from floor Symptoms: dizziness & SOB Treatment/Intervention: None Reason test stopped: protocol completed After recovery IV was: No redness or edema and Saline Lock flushed Patient to return to [place]. Med at : 9:45 Patient discharged: Transported back to room 319 via wc Patient's Condition upon discharge was: stable Comments: During test BP 141/70 & HR 71.  Recovery BP 129/76 & HR 60.  Symptoms resolved in recovery. Tina [name]

## 2013-05-06 NOTE — Progress Notes (Signed)
[facility] PROGRESS NOTE  RON JUNCO KZS:[id number] DOB: 01/10/1934 DOA: 05/05/2013 PCP: Purvis Kilts, MD  Brief narrative: 80 y.o. female with PMH of HTN, HPL, COPD, dementia, h/p CVA, DM, scleroderma, esophogeal erosion presented with substernal chest pressure, associated with diaphoresis, relieved with NTG; denied SOB, no fever, no cough; no vomiting. Chest pain resolved in ED. Cardiology consulted.   Assessment/Plan:  Principal Problem:   Chest pain - no events on telemetry over 24 hours, no chest pain over 24 hours - stress test today - CE x 3 negative - appreciate cardiology input - continue aspirin and statin per home medical regimen  Active Problems:   Hypertension - reasonable inpatient control - continue Lisinopril, Cardizem, Metoprolol per home medical regimen    COPD (chronic obstructive pulmonary disease) - clinically compensated   Barrett's esophagus - stable, continue PPI   DM (diabetes mellitus) - reasonable inpatient control - continue SSI while inpatient  - CBG's in past 24 hours: 88, 141, 116   Studies: Dg Chest Portable 1 View 01//27/2015  Stable borderline cardiomegaly and chronic interstitial changes without superimposed acute pulmonary process.   Code Status: Full Family Communication: Pt at bedside  Disposition Plan: Home when medically stabel   Leisa Lenz, MD  [facility] Pager [phone]  If 7PM-7AM, please contact night-coverage [email or web address] Password [email or web address] 05/06/2013, 11:59 AM   LOS: 1 day   Consultants:  Cardiology  Antibiotics:  None   HPI/Subjective: No events overnight.   Objective: Filed Vitals:   05/05/13 1245 05/05/13 1401 05/05/13 2148 05/06/13 0519  BP: 120/85 169/57 150/74 114/49  Pulse: 80 66 61 55  Temp:  97.8 F (36.6 C) 97.9 F (36.6 C) 97.5 F (36.4 C)  TempSrc:  Oral Oral Oral  Resp: 17  18 18   Height:  5\' 7"  (1.702 m)    Weight:  72.8 kg (160 lb 7.9 oz)    SpO2: 98% 100% 96% 97%     Intake/Output Summary (Last 24 hours) at 05/06/13 1159 Last data filed at 05/06/13 0900  Gross per 24 hour  Intake      0 ml  Output      0 ml  Net      0 ml    Exam:   General:  Pt is alert, follows commands appropriately, not in acute distress  Cardiovascular: Regular rate and rhythm, S1/S2 appreciated   Respiratory: Clear to auscultation bilaterally, no wheezing, no crackles, no rhonchi  Abdomen: Soft, non tender, non distended, bowel sounds present, no guarding  Extremities: No edema, pulses DP and PT palpable bilaterally  Neuro: Grossly nonfocal  Data Reviewed: Basic Metabolic Panel:  Recent Labs Lab 05/05/13 0619 05/05/13 0627  NA 140 140  K 3.9 3.8  CL 98 99  CO2 26  --   GLUCOSE 154* 155*  BUN 15 14  CREATININE 0.80 0.90  CALCIUM 9.7  --    Liver Function Tests:  Recent Labs Lab 05/05/13 0619  AST 38*  ALT 24  ALKPHOS 94  BILITOT 0.5  PROT 8.2  ALBUMIN 4.1   CBC:  Recent Labs Lab 05/05/13 0619 05/05/13 0627  WBC 12.8*  --   HGB 12.6 14.3  HCT 37.7 42.0  MCV 95.4  --   PLT 281  --    Cardiac Enzymes:  Recent Labs Lab 05/05/13 0615 05/05/13 1800 05/05/13 2322 05/06/13 0553  TROPONINI <0.30 <0.30 <0.30 <0.30   CBG:  Recent Labs Lab 05/05/13 1610 05/05/13 2151 05/06/13 0722 05/06/13 1121  GLUCAP 117* 88 141* 116*   Scheduled Meds: . allopurinol  100 mg Oral BID  . aspirin EC  81 mg Oral Daily  . citalopram  20 mg Oral QHS  . diltiazem  180 mg Oral BID  . donepezil  10 mg Oral QHS  . enoxaparin (LOVENOX) injection  40 mg Subcutaneous Daily  . insulin aspart  0-9 Units Subcutaneous TID WC  . lisinopril  20 mg Oral q morning - 10a  . metoprolol tartrate  12.5 mg Oral BID  . pantoprazole  40 mg Oral Daily  . regadenoson  0.4 mg Intravenous Once  . simvastatin  40 mg Oral QHS  . sodium chloride  3 mL Intravenous Q12H  . sucralfate  1 g Oral TID AC & HS  . zolpidem  5 mg Oral QHS   Continuous Infusions:

## 2013-05-06 NOTE — Progress Notes (Signed)
UR completed 

## 2013-05-06 NOTE — Discharge Instructions (Signed)
Chest Pain (Nonspecific) °It is often hard to give a specific diagnosis for the cause of chest pain. There is always a chance that your pain could be related to something serious, such as a heart attack or a blood clot in the lungs. You need to follow up with your caregiver for further evaluation. °CAUSES  °· Heartburn. °· Pneumonia or bronchitis. °· Anxiety or stress. °· Inflammation around your heart (pericarditis) or lung (pleuritis or pleurisy). °· A blood clot in the lung. °· A collapsed lung (pneumothorax). It can develop suddenly on its own (spontaneous pneumothorax) or from injury (trauma) to the chest. °· Shingles infection (herpes zoster virus). °The chest wall is composed of bones, muscles, and cartilage. Any of these can be the source of the pain. °· The bones can be bruised by injury. °· The muscles or cartilage can be strained by coughing or overwork. °· The cartilage can be affected by inflammation and become sore (costochondritis). °DIAGNOSIS  °Lab tests or other studies, such as X-rays, electrocardiography, stress testing, or cardiac imaging, may be needed to find the cause of your pain.  °TREATMENT  °· Treatment depends on what may be causing your chest pain. Treatment may include: °· Acid blockers for heartburn. °· Anti-inflammatory medicine. °· Pain medicine for inflammatory conditions. °· Antibiotics if an infection is present. °· You may be advised to change lifestyle habits. This includes stopping smoking and avoiding alcohol, caffeine, and chocolate. °· You may be advised to keep your head raised (elevated) when sleeping. This reduces the chance of acid going backward from your stomach into your esophagus. °· Most of the time, nonspecific chest pain will improve within 2 to 3 days with rest and mild pain medicine. °HOME CARE INSTRUCTIONS  °· If antibiotics were prescribed, take your antibiotics as directed. Finish them even if you start to feel better. °· For the next few days, avoid physical  activities that bring on chest pain. Continue physical activities as directed. °· Do not smoke. °· Avoid drinking alcohol. °· Only take over-the-counter or prescription medicine for pain, discomfort, or fever as directed by your caregiver. °· Follow your caregiver's suggestions for further testing if your chest pain does not go away. °· Keep any follow-up appointments you made. If you do not go to an appointment, you could develop lasting (chronic) problems with pain. If there is any problem keeping an appointment, you must call to reschedule. °SEEK MEDICAL CARE IF:  °· You think you are having problems from the medicine you are taking. Read your medicine instructions carefully. °· Your chest pain does not go away, even after treatment. °· You develop a rash with blisters on your chest. °SEEK IMMEDIATE MEDICAL CARE IF:  °· You have increased chest pain or pain that spreads to your arm, neck, jaw, back, or abdomen. °· You develop shortness of breath, an increasing cough, or you are coughing up blood. °· You have severe back or abdominal pain, feel nauseous, or vomit. °· You develop severe weakness, fainting, or chills. °· You have a fever. °THIS IS AN EMERGENCY. Do not wait to see if the pain will go away. Get medical help at once. Call your local emergency services (911 in [place] Do not drive yourself to the hospital. °MAKE SURE YOU:  °· Understand these instructions. °· Will watch your condition. °· Will get help right away if you are not doing well or get worse. °Document Released: 01/03/2005 Document Revised: 06/18/2011 Document Reviewed: 10/30/2007 °[employer]® Patient Information ©2014 [employer]   [employer]. ° °

## 2013-05-06 NOTE — Discharge Summary (Signed)
Physician Discharge Summary  Tina [name] PFX:[id number] DOB: 03/13/1934 DOA: 05/05/2013  PCP: Purvis Kilts, MD  Admit date: 05/05/2013 Discharge date: 05/06/2013  Recommendations for Outpatient Follow-up:  Follow up with PCP in 1-2 weeks after discharge to make sure symptoms are controlled.  Discharge Diagnoses:  Principal Problem:   Chest pain Active Problems:   Hypertension   COPD (chronic obstructive pulmonary disease)   Barrett's esophagus   HTN (hypertension)   DM (diabetes mellitus)    Discharge Condition: medically stable for discharge home today  Diet recommendation: as tolerated   History of present illness:   Hospital Course:  80 y.o. female with PMH of HTN, HPL, COPD, dementia, h/p CVA, DM, scleroderma, esophogeal erosion presented with substernal chest pressure, associated with diaphoresis, relieved with NTG; denied SOB, no fever, no cough; no vomiting. Chest pain resolved in ED. Cardiology consulted.   Assessment/Plan:   Principal Problem:  Chest pain  - no events on telemetry over 24 hours, no chest pain over 24 hours  - stress test negative on this admission  - CE x 3 negative  - appreciate cardiology following - continue aspirin and statin per home medical regimen  Active Problems:  Hypertension  - reasonable inpatient control  - continue Lisinopril, Cardizem, Metoprolol per home medical regimen  COPD (chronic obstructive pulmonary disease)  - clinically compensated  Barrett's esophagus  - stable, continue PPI  DM (diabetes mellitus)  - reasonable inpatient control   Studies:  Dg Chest Portable 1 View 01//27/2015 Stable borderline cardiomegaly and chronic interstitial changes without superimposed acute pulmonary process.   Code Status: Full  Family Communication: Pt at bedside    Consultants:  Cardiology  Antibiotics:  None    Signed:  Leisa Lenz, MD  [facility] 05/06/2013, 4:20 PM  Pager #:  [phone]   Discharge Exam: Filed Vitals:   05/06/13 1456  BP: 152/52  Pulse: 69  Temp: 98 F (36.7 C)  Resp: 18   Filed Vitals:   05/05/13 1401 05/05/13 2148 05/06/13 0519 05/06/13 1456  BP: 169/57 150/74 114/49 152/52  Pulse: 66 61 55 69  Temp: 97.8 F (36.6 C) 97.9 F (36.6 C) 97.5 F (36.4 C) 98 F (36.7 C)  TempSrc: Oral Oral Oral Oral  Resp:  18 18 18   Height: 5\' 7"  (1.702 m)     Weight: 72.8 kg (160 lb 7.9 oz)     SpO2: 100% 96% 97% 96%    General: Pt is alert, follows commands appropriately, not in acute distress Cardiovascular: Regular rate and rhythm, S1/S2 +, no murmurs, no rubs, no gallops Respiratory: Clear to auscultation bilaterally, no wheezing, no crackles, no rhonchi Abdominal: Soft, non tender, non distended, bowel sounds +, no guarding Extremities: no edema, no cyanosis, pulses palpable bilaterally DP and PT Neuro: Grossly nonfocal  Discharge Instructions  Discharge Orders   Future Orders Complete By Expires   Call MD for:  difficulty breathing, headache or visual disturbances  As directed    Call MD for:  persistant dizziness or light-headedness  As directed    Call MD for:  persistant nausea and vomiting  As directed    Call MD for:  severe uncontrolled pain  As directed    Diet - low sodium heart healthy  As directed    Discharge instructions  As directed    Comments:     1. Cardiac enzymes were within normal limits. Please follow up with cardiology as recommended. You were seen by cardiology as well  during this hospitalization.   Increase activity slowly  As directed        Medication List         allopurinol 100 MG tablet  Commonly known as:  ZYLOPRIM  Take 100 mg by mouth 2 (two) times daily.     aspirin EC 81 MG tablet  Take 81 mg by mouth 2 (two) times daily.     CARAFATE 1 GM/10ML suspension  Generic drug:  sucralfate  Take 1 g by mouth 4 (four) times daily.     CARTIA XT 180 MG 24 hr capsule  Generic drug:  diltiazem   Take 180 mg by mouth 2 (two) times daily.     citalopram 20 MG tablet  Commonly known as:  CELEXA  Take 20 mg by mouth at bedtime.     donepezil 10 MG tablet  Commonly known as:  ARICEPT  Take 10 mg by mouth at bedtime.     hydrochlorothiazide 25 MG tablet  Commonly known as:  HYDRODIURIL  Take 25 mg by mouth every morning.     lisinopril 10 MG tablet  Commonly known as:  PRINIVIL,ZESTRIL  Take 10 mg by mouth every morning.     metoprolol tartrate 25 MG tablet  Commonly known as:  LOPRESSOR  Take 12.5 mg by mouth 2 (two) times daily.     omeprazole 40 MG capsule  Commonly known as:  PRILOSEC  Take 40 mg by mouth daily.     simvastatin 40 MG tablet  Commonly known as:  ZOCOR  Take 40 mg by mouth at bedtime.     VITAMIN D PO  Take 1 tablet by mouth every morning.     zolpidem 10 MG tablet  Commonly known as:  AMBIEN  Take 5 mg by mouth at bedtime. For sleep           Follow-up Information   Follow up with Purvis Kilts, MD. Schedule an appointment as soon as possible for a visit in 2 weeks.   Specialty:  Family Medicine   Contact information:   [address] [place] [address] [phone]        The results of significant diagnostics from this hospitalization (including imaging, microbiology, ancillary and laboratory) are listed below for reference.    Significant Diagnostic Studies: Nm Myocar Single W/spect W/wall Motion And Ef  05/06/2013   CLINICAL DATA:  80 year old female with no known history of coronary artery disease, referred for chest pain.  EXAM: MYOCARDIAL IMAGING WITH SPECT (REST AND PHARMACOLOGIC-STRESS)  GATED LEFT VENTRICULAR WALL MOTION STUDY  LEFT VENTRICULAR EJECTION FRACTION  TECHNIQUE: Standard myocardial SPECT imaging was performed after resting intravenous injection of 10 mCi Tc-48m sestamibi. Subsequently, intravenous infusion of Lexiscan was performed under the supervision of the Cardiology staff. At  peak effect of the drug, 30 mCi Tc-64m sestamibi was injected intravenously and standard myocardial SPECT imaging was performed. Quantitative gated imaging was also performed to evaluate left ventricular wall motion, and estimate left ventricular ejection fraction.  COMPARISON:  None.  FINDINGS: A pharmacological stress  Baseline EKG showed normal sinus rhythm. After injection heart rate increased from 53 beats per min to 71 beats per min, and blood pressure increased from 133/59 to 141/70. The test was stopped after the injection was completed, the patient did not experience any chest pain. Post-injection EKG showed no ischemic changes and no significant arrhythmias.  Myocardial perfusion imaging  Raw images showed appropriate radiotracer uptake. There  was a small defect seen in the apex in the resting and post injection images, this area had normal wall motion. Finding most consistent with apical thinning. There was a moderate-sized inferior wall defect that showed mild reversibility. This area had normal wall motion. Findings most likely related to subdiaphragmatic attenuation given normal wall motion.  IMPRESSION: 1.  Low risk Lexiscan MPI for major cardiac events  2. Inferior wall defect most likely related to subdiaphragmatic attenuation, cannot completely excluded prior infarct with very mild peri-infarct ischemia in this area as described above. Overall low risk study for major cardiac events  3.  Normal left ventricular systolic function and wall motion.   Electronically Signed   By: Carlyle Dolly   On: 05/06/2013 15:03   Dg Chest Portable 1 View  05/05/2013   CLINICAL DATA:  Nausea, chest pain.  EXAM: PORTABLE CHEST - 1 VIEW  COMPARISON:  Chest radiograph October 12, 2011  FINDINGS: Cardiac silhouette is upper limits of normal in size. Mildly calcified aortic knob, mediastinal silhouette is unremarkable. Similar chronic mild interstitial changes. Postsurgical change of the right apical and mid lung zone.  Suspected calcified pleural plaques on the right. No pneumothorax.  Soft tissue planes and included osseous structures are nonsuspicious.  IMPRESSION: Stable borderline cardiomegaly and chronic interstitial changes without superimposed acute pulmonary process.   Electronically Signed   By: Elon Alas   On: 05/05/2013 06:25    Microbiology: No results found for this or any previous visit (from the past 240 hour(s)).   Labs: Basic Metabolic Panel:  Recent Labs Lab 05/05/13 0619 05/05/13 0627  NA 140 140  K 3.9 3.8  CL 98 99  CO2 26  --   GLUCOSE 154* 155*  BUN 15 14  CREATININE 0.80 0.90  CALCIUM 9.7  --    Liver Function Tests:  Recent Labs Lab 05/05/13 0619  AST 38*  ALT 24  ALKPHOS 94  BILITOT 0.5  PROT 8.2  ALBUMIN 4.1   No results found for this basename: LIPASE, AMYLASE,  in the last 168 hours No results found for this basename: AMMONIA,  in the last 168 hours CBC:  Recent Labs Lab 05/05/13 0619 05/05/13 0627  WBC 12.8*  --   HGB 12.6 14.3  HCT 37.7 42.0  MCV 95.4  --   PLT 281  --    Cardiac Enzymes:  Recent Labs Lab 05/05/13 0615 05/05/13 1800 05/05/13 2322 05/06/13 0553  TROPONINI <0.30 <0.30 <0.30 <0.30   BNP: BNP (last 3 results) No results found for this basename: PROBNP,  in the last 8760 hours CBG:  Recent Labs Lab 05/05/13 1610 05/05/13 2151 05/06/13 0722 05/06/13 1121  GLUCAP 117* 88 141* 116*    Time coordinating discharge: Over 30 minutes

## 2013-05-06 NOTE — Progress Notes (Signed)
Patient ID: Tina [name], female   DOB: October 28, 1933, 80 y.o.   MRN: [id number]     Subjective:   No chest pain overnight   Objective:   Temp:  [97.5 F (36.4 C)-98.7 F (37.1 C)] 97.5 F (36.4 C) (01/28 0519) Pulse Rate:  [55-80] 55 (01/28 0519) Resp:  [14-20] 18 (01/28 0519) BP: (102-169)/(49-85) 114/49 mmHg (01/28 0519) SpO2:  [91 %-100 %] 97 % (01/28 0519) Weight:  [160 lb 7.9 oz (72.8 kg)] 160 lb 7.9 oz (72.8 kg) (01/27 1401) Last BM Date: 05/04/13  Filed Weights   05/05/13 1401  Weight: 160 lb 7.9 oz (72.8 kg)   No intake or output data in the 24 hours ending 05/06/13 0809  Exam:  General:NAD  Resp:CTAB  Cardiac: RRR, no m/r/g, no JVD, no carotid bruits  KC:LEXNTZG soft, NT, ND  MSK: extremities are warm no edema  Neuro: no focal deficits   Lab Results:  Basic Metabolic Panel:  Recent Labs Lab 05/05/13 0619 05/05/13 0627  NA 140 140  K 3.9 3.8  CL 98 99  CO2 26  --   GLUCOSE 154* 155*  BUN 15 14  CREATININE 0.80 0.90  CALCIUM 9.7  --     Liver Function Tests:  Recent Labs Lab 05/05/13 0619  AST 38*  ALT 24  ALKPHOS 94  BILITOT 0.5  PROT 8.2  ALBUMIN 4.1    CBC:  Recent Labs Lab 05/05/13 0619 05/05/13 0627  WBC 12.8*  --   HGB 12.6 14.3  HCT 37.7 42.0  MCV 95.4  --   PLT 281  --     Cardiac Enzymes:  Recent Labs Lab 05/05/13 1800 05/05/13 2322 05/06/13 0553  TROPONINI <0.30 <0.30 <0.30    BNP: No results found for this basename: PROBNP,  in the last 8760 hours  Coagulation:  Recent Labs Lab 05/05/13 0619  INR 1.13    ECG:   Medications:   Scheduled Medications: . allopurinol  100 mg Oral BID  . aspirin EC  81 mg Oral Daily  . citalopram  20 mg Oral QHS  . diltiazem  180 mg Oral BID  . donepezil  10 mg Oral QHS  . enoxaparin (LOVENOX) injection  40 mg Subcutaneous Daily  . insulin aspart  0-9 Units Subcutaneous TID WC  . lisinopril  20 mg Oral q morning - 10a  . metoprolol tartrate  12.5 mg Oral  BID  . pantoprazole  40 mg Oral Daily  . regadenoson  0.4 mg Intravenous Once  . simvastatin  40 mg Oral QHS  . sodium chloride  3 mL Intravenous Q12H  . sucralfate  1 g Oral TID AC & HS  . zolpidem  5 mg Oral QHS     Infusions:     PRN Medications:  acetaminophen, acetaminophen, nitroGLYCERIN, ondansetron (ZOFRAN) IV, ondansetron, technetium sestamibi, technetium sestamibi     Assessment/Plan   80 yo female hx of scleroderma, HTN, DM, HL, and Barrett's esophagus admitted with chest pain.   1. Chest pain:  - troponins negative x, 3, no ischemic changes on EKG - she has several CAD risk factors, will follow up results of stress test today.    2. HTN: - follow bp after recent increase in lisinopril  3. Hyperlipidemia:  - cholesterol is at goal, continue current therapy.           Carlyle Dolly, M.D., F.A.C.C.

## 2013-05-15 DIAGNOSIS — N390 Urinary tract infection, site not specified: Secondary | ICD-10-CM | POA: Diagnosis not present

## 2013-05-15 DIAGNOSIS — IMO0002 Reserved for concepts with insufficient information to code with codable children: Secondary | ICD-10-CM | POA: Diagnosis not present

## 2013-05-15 DIAGNOSIS — I10 Essential (primary) hypertension: Secondary | ICD-10-CM | POA: Diagnosis not present

## 2013-05-15 DIAGNOSIS — R079 Chest pain, unspecified: Secondary | ICD-10-CM | POA: Diagnosis not present

## 2013-06-18 DIAGNOSIS — E785 Hyperlipidemia, unspecified: Secondary | ICD-10-CM | POA: Diagnosis not present

## 2013-06-18 DIAGNOSIS — N390 Urinary tract infection, site not specified: Secondary | ICD-10-CM | POA: Diagnosis not present

## 2013-06-18 DIAGNOSIS — Z23 Encounter for immunization: Secondary | ICD-10-CM | POA: Diagnosis not present

## 2013-06-18 DIAGNOSIS — E119 Type 2 diabetes mellitus without complications: Secondary | ICD-10-CM | POA: Diagnosis not present

## 2013-06-18 DIAGNOSIS — I10 Essential (primary) hypertension: Secondary | ICD-10-CM | POA: Diagnosis not present

## 2013-06-18 DIAGNOSIS — IMO0002 Reserved for concepts with insufficient information to code with codable children: Secondary | ICD-10-CM | POA: Diagnosis not present

## 2013-07-21 DIAGNOSIS — M79609 Pain in unspecified limb: Secondary | ICD-10-CM | POA: Diagnosis not present

## 2013-07-21 DIAGNOSIS — R6889 Other general symptoms and signs: Secondary | ICD-10-CM | POA: Diagnosis not present

## 2013-08-15 DIAGNOSIS — L940 Localized scleroderma [morphea]: Secondary | ICD-10-CM | POA: Diagnosis not present

## 2013-08-15 DIAGNOSIS — N390 Urinary tract infection, site not specified: Secondary | ICD-10-CM | POA: Diagnosis not present

## 2013-08-15 DIAGNOSIS — IMO0002 Reserved for concepts with insufficient information to code with codable children: Secondary | ICD-10-CM | POA: Diagnosis not present

## 2013-08-15 DIAGNOSIS — J301 Allergic rhinitis due to pollen: Secondary | ICD-10-CM | POA: Diagnosis not present

## 2013-09-08 ENCOUNTER — Emergency Department (HOSPITAL_COMMUNITY)
Admission: EM | Admit: 2013-09-08 | Discharge: 2013-09-08 | Disposition: A | Discharge status: Home / Self Care | Payer: Medicare Other | Attending: Emergency Medicine | Admitting: Emergency Medicine

## 2013-09-08 ENCOUNTER — Encounter (HOSPITAL_COMMUNITY): Payer: Self-pay | Admitting: Emergency Medicine

## 2013-09-08 DIAGNOSIS — L03019 Cellulitis of unspecified finger: Secondary | ICD-10-CM | POA: Diagnosis not present

## 2013-09-08 DIAGNOSIS — Z7982 Long term (current) use of aspirin: Secondary | ICD-10-CM | POA: Diagnosis not present

## 2013-09-08 DIAGNOSIS — Z8673 Personal history of transient ischemic attack (TIA), and cerebral infarction without residual deficits: Secondary | ICD-10-CM | POA: Diagnosis not present

## 2013-09-08 DIAGNOSIS — IMO0001 Reserved for inherently not codable concepts without codable children: Secondary | ICD-10-CM

## 2013-09-08 DIAGNOSIS — I7300 Raynaud's syndrome without gangrene: Secondary | ICD-10-CM | POA: Insufficient documentation

## 2013-09-08 DIAGNOSIS — Z79899 Other long term (current) drug therapy: Secondary | ICD-10-CM | POA: Diagnosis not present

## 2013-09-08 DIAGNOSIS — Z87891 Personal history of nicotine dependence: Secondary | ICD-10-CM | POA: Diagnosis not present

## 2013-09-08 DIAGNOSIS — I10 Essential (primary) hypertension: Secondary | ICD-10-CM | POA: Diagnosis not present

## 2013-09-08 DIAGNOSIS — Z8719 Personal history of other diseases of the digestive system: Secondary | ICD-10-CM | POA: Insufficient documentation

## 2013-09-08 DIAGNOSIS — J4489 Other specified chronic obstructive pulmonary disease: Secondary | ICD-10-CM | POA: Insufficient documentation

## 2013-09-08 DIAGNOSIS — J449 Chronic obstructive pulmonary disease, unspecified: Secondary | ICD-10-CM | POA: Insufficient documentation

## 2013-09-08 DIAGNOSIS — L02519 Cutaneous abscess of unspecified hand: Secondary | ICD-10-CM | POA: Diagnosis not present

## 2013-09-08 DIAGNOSIS — E119 Type 2 diabetes mellitus without complications: Secondary | ICD-10-CM | POA: Diagnosis not present

## 2013-09-08 DIAGNOSIS — Z8739 Personal history of other diseases of the musculoskeletal system and connective tissue: Secondary | ICD-10-CM | POA: Insufficient documentation

## 2013-09-08 MED ORDER — LIDOCAINE HCL (PF) 1 % IJ SOLN
INTRAMUSCULAR | Status: AC
  Administered 2013-09-08: 2.1 mL via INTRAMUSCULAR
  Filled 2013-09-08: qty 5

## 2013-09-08 MED ORDER — CEFTRIAXONE SODIUM 1 G IJ SOLR
1.0000 g | Freq: Once | INTRAMUSCULAR | Status: AC
  Administered 2013-09-08: 1 g via INTRAMUSCULAR
  Filled 2013-09-08: qty 10

## 2013-09-08 MED ORDER — HYDROCODONE-ACETAMINOPHEN 5-325 MG PO TABS: 2.0000 | ORAL_TABLET | ORAL | Status: DC | PRN

## 2013-09-08 MED ORDER — CEPHALEXIN 500 MG PO CAPS: 500.0000 mg | ORAL_CAPSULE | Freq: Four times a day (QID) | ORAL | Status: DC

## 2013-09-08 NOTE — ED Provider Notes (Signed)
CSN: [id number]     Arrival date & time 09/08/13  1807 History   This chart was scribed for Tina Speak, MD by Martinique Peace, ED Scribe. The patient was seen in [facility]16A/APA16A. The patient's care was started at 6:27 PM.   Chief Complaint  Patient presents with  . Hand Pain   The history is provided by the patient and a relative. A language interpreter was used.   HPI Comments: Tina [name] is a 80 y.o. female with h/o Scleroderma, who presents to the Emergency Department complaining of gradually worsening, constant, severe, throbbing pain and swelling accompanied by redness in her left middle knuckle over the past week. She states that she has lost flexion and extension of fingers. Pt also states that she has tried Prednisone with no relief. She states possible UTI due to urinary retention. Pt reports associated fever and rhinorrhea. She denies any other associated symptoms.   Past Medical History  Diagnosis Date  . Lower abdominal pain   . Chronic diarrhea   . Barrett's esophagus   . Irritable bowel syndrome   . Helicobacter pylori (H. pylori)   . Gastritis   . Diabetes mellitus   . Hypertension   . Sleep apnea     pt is not on machine-diagnosed many years ago  . 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 Scleroderma   . COPD (chronic obstructive pulmonary disease)   . Raynaud's disease   . Renal disorder   . Gout   . Stroke    Past Surgical History  Procedure Laterality Date  . Esophagogastroduodenoscopy  06/09  . Colonoscopy  12/2008  . Patella fracture surgery    . Esophagogastroduodenoscopy  02/02/05    EGD ED  . Esophagogastroduodenoscopy  03/08/00    EGD ED  . Cholecystectomy    . Lung surgery  2002    Lung collapsed  . Back surgery     History reviewed. No pertinent family history. History  Substance Use Topics  . Smoking status: Former Research scientist (life sciences)  . Smokeless tobacco: Not on file  . Alcohol Use: No   OB History   Grav Para Term Preterm Abortions TAB SAB Ect Mult Living                  Review of Systems A complete 10 system review of systems was obtained and all systems are negative except as noted in the HPI and PMH.   Allergies  Review of patient's allergies indicates no known allergies.  Home Medications   Prior to Admission medications   Medication Sig Start Date End Date Taking? Authorizing Provider  allopurinol (ZYLOPRIM) 100 MG tablet Take 100 mg by mouth 2 (two) times daily.     Historical Provider, MD  aspirin EC 81 MG tablet Take 81 mg by mouth 2 (two) times daily.     Historical Provider, MD  Cholecalciferol (VITAMIN D PO) Take 1 tablet by mouth every morning.    Historical Provider, MD  citalopram (CELEXA) 20 MG tablet Take 20 mg by mouth at bedtime.     Historical Provider, MD  diltiazem (CARTIA XT) 180 MG 24 hr capsule Take 180 mg by mouth 2 (two) times daily.    Historical Provider, MD  donepezil (ARICEPT) 10 MG tablet Take 10 mg by mouth at bedtime.    Historical Provider, MD  hydrochlorothiazide 25 MG tablet Take 25 mg by mouth every morning.     Historical Provider, MD  lisinopril (PRINIVIL,ZESTRIL) 10 MG tablet Take 10  mg by mouth every morning.     Historical Provider, MD  metoprolol tartrate (LOPRESSOR) 25 MG tablet Take 12.5 mg by mouth 2 (two) times daily.     Historical Provider, MD  omeprazole (PRILOSEC) 40 MG capsule Take 40 mg by mouth daily.    Historical Provider, MD  simvastatin (ZOCOR) 40 MG tablet Take 40 mg by mouth at bedtime.      Historical Provider, MD  sucralfate (CARAFATE) 1 GM/10ML suspension Take 1 g by mouth 4 (four) times daily.    Historical Provider, MD  zolpidem (AMBIEN) 10 MG tablet Take 5 mg by mouth at bedtime. For sleep    Historical Provider, MD   Triage Vitals: BP 146/127  Pulse 67  Temp(Src) 97.8 F (36.6 C) (Oral)  Resp 18  SpO2 100% Physical Exam  Constitutional: She is oriented to person, place, and time. She appears well-developed and well-nourished.  HENT:  Head: Normocephalic and atraumatic.   Eyes: Conjunctivae and EOM are normal.  Neck: Normal range of motion. Neck supple.  Cardiovascular: Normal rate.   Pulmonary/Chest: Effort normal and breath sounds normal.  Abdominal: Soft. Bowel sounds are normal.  Musculoskeletal: She exhibits edema.  All fingers are noted to have contractures at the PIP joint. The left middle finger is noted to have warmth, redness, and swelling over the PIP joint. There is a small abrasion which a small amount of purulent material can be expressed.   Neurological: She is alert and oriented to person, place, and time.  Skin: Skin is warm and dry.    ED Course  Procedures (including critical care time) DIAGNOSTIC STUDIES: Oxygen Saturation is 100% on room air, normal by my interpretation.    COORDINATION OF CARE: 6:34 PM- Treatment plan was discussed with patient who verbalizes understanding and agrees.   Labs Review Labs Reviewed - No data to display  Imaging Review No results found.   EKG Interpretation None      MDM   Final diagnoses:  None    Patient with history of scleroderma. All fingers are contracted at the PIP joint. She presents today with complaints of redness, pain, and swelling at the left third PIP joint. She was prescribed prednisone by her primary doctor but this is not improving. This appears to be cellulitis as there is a small wound with a slight amount of purulent material. This will be treated with Keflex and when necessary followup.  I personally performed the services described in this documentation, which was scribed in my presence. The recorded information has been reviewed and is accurate.      Tina Speak, MD 09/08/13 2120

## 2013-09-08 NOTE — ED Notes (Addendum)
Pain , swelling of lt middle finger, Pt has scleraderma. Also thinks she may have UTI

## 2013-09-08 NOTE — Discharge Instructions (Signed)
Keflex as prescribed.  Hydrocodone as prescribed as needed for pain.  Return to the ER if not improving or worsening in the next 2-3 days, sooner if you develop any other new and concerning symptoms   Cellulitis Cellulitis is an infection of the skin and the tissue beneath it. The infected area is usually red and tender. Cellulitis occurs most often in the arms and lower legs.  CAUSES  Cellulitis is caused by bacteria that enter the skin through cracks or cuts in the skin. The most common types of bacteria that cause cellulitis are Staphylococcus and Streptococcus. SYMPTOMS   Redness and warmth.  Swelling.  Tenderness or pain.  Fever. DIAGNOSIS  Your caregiver can usually determine what is wrong based on a physical exam. Blood tests may also be done. TREATMENT  Treatment usually involves taking an antibiotic medicine. HOME CARE INSTRUCTIONS   Take your antibiotics as directed. Finish them even if you start to feel better.  Keep the infected arm or leg elevated to reduce swelling.  Apply a warm cloth to the affected area up to 4 times per day to relieve pain.  Only take over-the-counter or prescription medicines for pain, discomfort, or fever as directed by your caregiver.  Keep all follow-up appointments as directed by your caregiver. SEEK MEDICAL CARE IF:   You notice red streaks coming from the infected area.  Your red area gets larger or turns dark in color.  Your bone or joint underneath the infected area becomes painful after the skin has healed.  Your infection returns in the same area or another area.  You notice a swollen bump in the infected area.  You develop new symptoms. SEEK IMMEDIATE MEDICAL CARE IF:   You have a fever.  You feel very sleepy.  You develop vomiting or diarrhea.  You have a general ill feeling (malaise) with muscle aches and pains. MAKE SURE YOU:   Understand these instructions.  Will watch your condition.  Will get help  right away if you are not doing well or get worse. Document Released: 01/03/2005 Document Revised: 09/25/2011 Document Reviewed: 06/11/2011 [facility] Patient Information 2014 [place].

## 2013-11-01 ENCOUNTER — Emergency Department (HOSPITAL_COMMUNITY): Payer: Medicare Other

## 2013-11-01 ENCOUNTER — Inpatient Hospital Stay (HOSPITAL_COMMUNITY)
Admission: EM | Admit: 2013-11-01 | Discharge: 2013-11-06 | DRG: 956 | Disposition: A | Discharge status: Skilled Nursing Facility | Payer: Medicare Other | Attending: Internal Medicine | Admitting: Internal Medicine

## 2013-11-01 ENCOUNTER — Encounter (HOSPITAL_COMMUNITY): Payer: Self-pay | Admitting: Emergency Medicine

## 2013-11-01 DIAGNOSIS — F0390 Unspecified dementia without behavioral disturbance: Secondary | ICD-10-CM | POA: Diagnosis present

## 2013-11-01 DIAGNOSIS — A048 Other specified bacterial intestinal infections: Secondary | ICD-10-CM | POA: Diagnosis not present

## 2013-11-01 DIAGNOSIS — I10 Essential (primary) hypertension: Secondary | ICD-10-CM

## 2013-11-01 DIAGNOSIS — E119 Type 2 diabetes mellitus without complications: Secondary | ICD-10-CM | POA: Diagnosis not present

## 2013-11-01 DIAGNOSIS — S4980XA Other specified injuries of shoulder and upper arm, unspecified arm, initial encounter: Secondary | ICD-10-CM | POA: Diagnosis not present

## 2013-11-01 DIAGNOSIS — R52 Pain, unspecified: Secondary | ICD-10-CM | POA: Diagnosis not present

## 2013-11-01 DIAGNOSIS — K219 Gastro-esophageal reflux disease without esophagitis: Secondary | ICD-10-CM | POA: Diagnosis present

## 2013-11-01 DIAGNOSIS — F3289 Other specified depressive episodes: Secondary | ICD-10-CM | POA: Diagnosis not present

## 2013-11-01 DIAGNOSIS — E7800 Pure hypercholesterolemia, unspecified: Secondary | ICD-10-CM | POA: Diagnosis present

## 2013-11-01 DIAGNOSIS — M2550 Pain in unspecified joint: Secondary | ICD-10-CM | POA: Diagnosis not present

## 2013-11-01 DIAGNOSIS — M349 Systemic sclerosis, unspecified: Secondary | ICD-10-CM | POA: Diagnosis present

## 2013-11-01 DIAGNOSIS — J4489 Other specified chronic obstructive pulmonary disease: Secondary | ICD-10-CM | POA: Diagnosis not present

## 2013-11-01 DIAGNOSIS — Y92009 Unspecified place in unspecified non-institutional (private) residence as the place of occurrence of the external cause: Secondary | ICD-10-CM

## 2013-11-01 DIAGNOSIS — S32509A Unspecified fracture of unspecified pubis, initial encounter for closed fracture: Secondary | ICD-10-CM | POA: Diagnosis present

## 2013-11-01 DIAGNOSIS — K2270 Barrett's esophagus without dysplasia: Secondary | ICD-10-CM

## 2013-11-01 DIAGNOSIS — Z79899 Other long term (current) drug therapy: Secondary | ICD-10-CM | POA: Diagnosis not present

## 2013-11-01 DIAGNOSIS — S0990XA Unspecified injury of head, initial encounter: Secondary | ICD-10-CM | POA: Diagnosis not present

## 2013-11-01 DIAGNOSIS — S46909A Unspecified injury of unspecified muscle, fascia and tendon at shoulder and upper arm level, unspecified arm, initial encounter: Secondary | ICD-10-CM | POA: Diagnosis not present

## 2013-11-01 DIAGNOSIS — Z8673 Personal history of transient ischemic attack (TIA), and cerebral infarction without residual deficits: Secondary | ICD-10-CM | POA: Diagnosis not present

## 2013-11-01 DIAGNOSIS — W19XXXA Unspecified fall, initial encounter: Secondary | ICD-10-CM | POA: Diagnosis not present

## 2013-11-01 DIAGNOSIS — S298XXA Other specified injuries of thorax, initial encounter: Secondary | ICD-10-CM | POA: Diagnosis not present

## 2013-11-01 DIAGNOSIS — Z825 Family history of asthma and other chronic lower respiratory diseases: Secondary | ICD-10-CM

## 2013-11-01 DIAGNOSIS — R1319 Other dysphagia: Secondary | ICD-10-CM

## 2013-11-01 DIAGNOSIS — R197 Diarrhea, unspecified: Secondary | ICD-10-CM | POA: Diagnosis not present

## 2013-11-01 DIAGNOSIS — G4730 Sleep apnea, unspecified: Secondary | ICD-10-CM | POA: Diagnosis present

## 2013-11-01 DIAGNOSIS — K589 Irritable bowel syndrome without diarrhea: Secondary | ICD-10-CM | POA: Diagnosis present

## 2013-11-01 DIAGNOSIS — Z808 Family history of malignant neoplasm of other organs or systems: Secondary | ICD-10-CM

## 2013-11-01 DIAGNOSIS — D72829 Elevated white blood cell count, unspecified: Secondary | ICD-10-CM | POA: Diagnosis present

## 2013-11-01 DIAGNOSIS — D539 Nutritional anemia, unspecified: Secondary | ICD-10-CM | POA: Diagnosis present

## 2013-11-01 DIAGNOSIS — W010XXA Fall on same level from slipping, tripping and stumbling without subsequent striking against object, initial encounter: Secondary | ICD-10-CM | POA: Diagnosis present

## 2013-11-01 DIAGNOSIS — D638 Anemia in other chronic diseases classified elsewhere: Secondary | ICD-10-CM | POA: Diagnosis present

## 2013-11-01 DIAGNOSIS — M109 Gout, unspecified: Secondary | ICD-10-CM | POA: Diagnosis present

## 2013-11-01 DIAGNOSIS — Z9181 History of falling: Secondary | ICD-10-CM | POA: Diagnosis not present

## 2013-11-01 DIAGNOSIS — Z01810 Encounter for preprocedural cardiovascular examination: Secondary | ICD-10-CM | POA: Diagnosis not present

## 2013-11-01 DIAGNOSIS — R1310 Dysphagia, unspecified: Secondary | ICD-10-CM

## 2013-11-01 DIAGNOSIS — S72143A Displaced intertrochanteric fracture of unspecified femur, initial encounter for closed fracture: Principal | ICD-10-CM | POA: Diagnosis present

## 2013-11-01 DIAGNOSIS — Z880 Allergy status to penicillin: Secondary | ICD-10-CM

## 2013-11-01 DIAGNOSIS — Z7982 Long term (current) use of aspirin: Secondary | ICD-10-CM

## 2013-11-01 DIAGNOSIS — R9431 Abnormal electrocardiogram [ECG] [EKG]: Secondary | ICD-10-CM | POA: Diagnosis present

## 2013-11-01 DIAGNOSIS — J449 Chronic obstructive pulmonary disease, unspecified: Secondary | ICD-10-CM | POA: Diagnosis not present

## 2013-11-01 DIAGNOSIS — Z87891 Personal history of nicotine dependence: Secondary | ICD-10-CM | POA: Diagnosis not present

## 2013-11-01 DIAGNOSIS — M6281 Muscle weakness (generalized): Secondary | ICD-10-CM | POA: Diagnosis not present

## 2013-11-01 DIAGNOSIS — M25559 Pain in unspecified hip: Secondary | ICD-10-CM | POA: Diagnosis not present

## 2013-11-01 DIAGNOSIS — G4700 Insomnia, unspecified: Secondary | ICD-10-CM | POA: Diagnosis not present

## 2013-11-01 DIAGNOSIS — D649 Anemia, unspecified: Secondary | ICD-10-CM | POA: Diagnosis not present

## 2013-11-01 DIAGNOSIS — S72001A Fracture of unspecified part of neck of right femur, initial encounter for closed fracture: Secondary | ICD-10-CM

## 2013-11-01 DIAGNOSIS — I7300 Raynaud's syndrome without gangrene: Secondary | ICD-10-CM | POA: Diagnosis not present

## 2013-11-01 DIAGNOSIS — S72141A Displaced intertrochanteric fracture of right femur, initial encounter for closed fracture: Secondary | ICD-10-CM

## 2013-11-01 DIAGNOSIS — Z7401 Bed confinement status: Secondary | ICD-10-CM | POA: Diagnosis not present

## 2013-11-01 DIAGNOSIS — S72009A Fracture of unspecified part of neck of unspecified femur, initial encounter for closed fracture: Secondary | ICD-10-CM | POA: Diagnosis not present

## 2013-11-01 HISTORY — DX: Essential (primary) hypertension: I10

## 2013-11-01 HISTORY — DX: Type 2 diabetes mellitus without complications: E11.9

## 2013-11-01 MED ORDER — SODIUM CHLORIDE 0.9 % IV SOLN
Freq: Once | INTRAVENOUS | Status: AC
  Administered 2013-11-02: 02:00:00 via INTRAVENOUS

## 2013-11-01 NOTE — ED Notes (Signed)
Pt tripped over her shoe while coming out of her bathroom. Pt c/o right shoulder pain and right hip pain.Pt arrived via EMS on metal back board, removed for xrays. Pt has no neck or back pain. Shortened and rotated right leg. Pedal pulses present. 10/10 pain. Pt given 6 mg Morphine en route. IV established PTA via EMS.

## 2013-11-01 NOTE — ED Provider Notes (Signed)
CSN: [id number]     Arrival date & time 11/01/13  2252 History  This chart was scribed for Mariea Clonts, MD by Jeanell Sparrow, ED Scribe. This patient was seen in room APA14/AP[facility]14 and the patient's care was started at 11:09 PM.   Chief Complaint  Patient presents with  . Fall   HPI HPI Comments: Tina [name] is a 80 y.o. female who presents to the Emergency Department complaining of a fall that occurred today. She states that she tripped and fell coming out of the bathroom. She reports feeling light headed afterwards. She denies any LOC or weakness. She reports pain on the right hip, knee, and shoulder. She reports associated chills. She states that she take aspirin. She denies any fever, visual disturbance, headache, chest pain, SOB, abdominal pain, and diarrhea.  Past Medical History  Diagnosis Date  . Lower abdominal pain   . Chronic diarrhea   . Barrett's esophagus   . Irritable bowel syndrome   . Helicobacter pylori (H. pylori)   . Gastritis   . Diabetes mellitus   . Hypertension   . Sleep apnea     pt is not on machine-diagnosed many years ago  . 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 Scleroderma   . COPD (chronic obstructive pulmonary disease)   . Raynaud's disease   . Renal disorder   . Gout   . Stroke    Past Surgical History  Procedure Laterality Date  . Esophagogastroduodenoscopy  06/09  . Colonoscopy  12/2008  . Patella fracture surgery    . Esophagogastroduodenoscopy  02/02/05    EGD ED  . Esophagogastroduodenoscopy  03/08/00    EGD ED  . Cholecystectomy    . Lung surgery  2002    Lung collapsed  . Back surgery     No family history on file. History  Substance Use Topics  . Smoking status: Former Research scientist (life sciences)  . Smokeless tobacco: Not on file  . Alcohol Use: No   OB History   Grav Para Term Preterm Abortions TAB SAB Ect Mult Living                 Review of Systems  Constitutional: Negative for fever.  Eyes: Negative for visual disturbance.  Respiratory:  Negative for shortness of breath.   Cardiovascular: Negative for chest pain.  Gastrointestinal: Negative for abdominal pain and diarrhea.  Musculoskeletal: Positive for myalgias.  Neurological: Positive for light-headedness. Negative for syncope, weakness and headaches.  All other systems reviewed and are negative.     Allergies  Penicillins  Home Medications   Prior to Admission medications   Medication Sig Start Date End Date Taking? Authorizing Provider  allopurinol (ZYLOPRIM) 100 MG tablet Take 100 mg by mouth 2 (two) times daily.    Yes Historical Provider, MD  ALPRAZolam (XANAX) 0.25 MG tablet Take 0.25 mg by mouth 3 (three) times daily as needed for anxiety.   Yes Historical Provider, MD  aspirin EC 81 MG tablet Take 81 mg by mouth 2 (two) times daily.    Yes Historical Provider, MD  cephALEXin (KEFLEX) 500 MG capsule Take 1 capsule (500 mg total) by mouth 4 (four) times daily. 09/08/13  Yes Veryl Speak, MD  citalopram (CELEXA) 20 MG tablet Take 20 mg by mouth at bedtime.    Yes Historical Provider, MD  donepezil (ARICEPT) 10 MG tablet Take 10 mg by mouth at bedtime.   Yes Historical Provider, MD  Ferrous Sulfate (IRON) 325 (65 FE) MG TABS  Take by mouth.   Yes Historical Provider, MD  hydrochlorothiazide 25 MG tablet Take 25 mg by mouth every morning.    Yes Historical Provider, MD  HYDROcodone-acetaminophen (NORCO) 5-325 MG per tablet Take 2 tablets by mouth every 4 (four) hours as needed. 09/08/13  Yes Veryl Speak, MD  lisinopril (PRINIVIL,ZESTRIL) 10 MG tablet Take 10 mg by mouth every morning.    Yes Historical Provider, MD  metoprolol tartrate (LOPRESSOR) 25 MG tablet Take 12.5 mg by mouth 2 (two) times daily.    Yes Historical Provider, MD  omeprazole (PRILOSEC) 40 MG capsule Take 40 mg by mouth daily.   Yes Historical Provider, MD  simvastatin (ZOCOR) 40 MG tablet Take 40 mg by mouth at bedtime.     Yes Historical Provider, MD  zolpidem (AMBIEN) 10 MG tablet Take 5 mg by  mouth at bedtime. For sleep   Yes Historical Provider, MD  Cholecalciferol (VITAMIN D PO) Take 1 tablet by mouth every morning.    Historical Provider, MD  diltiazem (CARTIA XT) 180 MG 24 hr capsule Take 180 mg by mouth 2 (two) times daily.    Historical Provider, MD  sucralfate (CARAFATE) 1 GM/10ML suspension Take 1 g by mouth 4 (four) times daily.    Historical Provider, MD   BP 167/56  Pulse 80  Temp(Src) 98.1 F (36.7 C)  Resp 18  Ht 5' 7.5" (1.715 m)  Wt 173 lb (78.472 kg)  BMI 26.68 kg/m2  SpO2 99% Physical Exam  Nursing note and vitals reviewed. Constitutional: She appears well-developed and well-nourished.  HENT:  Head: Normocephalic and atraumatic.  Neck: Neck supple. No tracheal deviation present.  No midline tenderness. No midline thoracic tenderness   Cardiovascular: Normal rate.   Pulmonary/Chest: Effort normal and breath sounds normal. No respiratory distress.  Abdominal: Soft. Bowel sounds are normal. She exhibits no distension. There is tenderness. There is no rebound.  Mild suprapubic tenderness with no ecchymosis.  Musculoskeletal: Normal range of motion. She exhibits tenderness.  Tenderness to proximal humorous and anterior right shoulder. Good ROM on right elbow and wrist with no tenderness. Good ROM on left side  No tenderness in left knee or ankle. Pain with flexion in right hip. Good distal pulses.   Skin:  No signs of open wounds.     ED Course  Procedures (including critical care time)   right femoral nerve block Discussed risks and benefits of procedure, patient wishes to proceed. 20-gauge needle and bupivacaine used with sterile gloves. Femoral artery and vein identified with ultrasound along with femoral nerve. Ultrasound-guided for IV for bupivacaine injection. Patient is having difficulty lying flat discomfort and only able to inject 10 cc of bupivacaine. Discussed this is inadequate and unlikely to help pain however patient not tolerating well.  Plan for IV narcotics as needed when necessary. Neurovascularly intact right leg after procedure. DIAGNOSTIC STUDIES: Oxygen Saturation is 99% on RA, normal by my interpretation.    COORDINATION OF CARE: 11:13 PM- Pt advised of plan for treatment which medication, radiology, labs and pt agrees.  Labs Review Labs Reviewed  BASIC METABOLIC PANEL - Abnormal; Notable for the following:    Glucose, Bld 139 (*)    GFR calc non Af Amer 78 (*)    All other components within normal limits  CBC WITH DIFFERENTIAL - Abnormal; Notable for the following:    WBC 16.3 (*)    RBC 3.03 (*)    Hemoglobin 10.1 (*)    HCT 30.6 (*)  MCV 101.0 (*)    Neutrophils Relative % 81 (*)    Neutro Abs 13.1 (*)    All other components within normal limits  URINE CULTURE  TROPONIN I  URINALYSIS, ROUTINE W REFLEX MICROSCOPIC  PROTIME-INR  TYPE AND SCREEN    Imaging Review Dg Chest 1 View  11/02/2013   CLINICAL DATA:  Fall.  EXAM: CHEST - 1 VIEW  COMPARISON:  Chest radiograph May 05, 2013  FINDINGS: The cardiac silhouette appears mildly enlarged, similar. Mediastinal silhouette is nonsuspicious, mildly calcified aortic knob. Diffuse mild interstitial prominence, slightly increasing. Similar nodularity right midlung zone, postoperative change the right lung apex is similar pleural thickening. No pneumothorax. Soft tissue planes and included osseous structures are nonsuspicious.  IMPRESSION: Stable cardiomegaly, increasing interstitial prominence may reflect pulmonary edema. Similar nodularity/calcified pleural plaque and postoperative changes of right midlung zone.   Electronically Signed   By: Elon Alas   On: 11/02/2013 01:22   Dg Shoulder Right  11/02/2013   CLINICAL DATA:  Fall.  EXAM: RIGHT SHOULDER - 2+ VIEW  COMPARISON:  None.  FINDINGS: There is no evidence of fracture or dislocation. Mild acromioclavicular undersurface spurring. There is no evidence of arthropathy or other focal bone  abnormality. Soft tissues are unremarkable. Included view of the chest demonstrates postsurgical change and nodularity.  IMPRESSION: No acute fracture deformity or dislocation.   Electronically Signed   By: Elon Alas   On: 11/02/2013 01:21   Dg Hip Complete Right  11/02/2013   CLINICAL DATA:  Right hip pain after a fall.  EXAM: RIGHT HIP - COMPLETE 2+ VIEW  COMPARISON:  None.  FINDINGS: Acute comminuted inter trochanteric fracture of the right hip with varus angulation and impaction. No dislocation of the hip joint. Degenerative changes are present in the lower lumbar spine and both hips. Pelvis, SI joints, and symphysis pubis appear intact.  IMPRESSION: Comminuted intertrochanteric fractures of the right hip.   Electronically Signed   By: Lucienne Capers M.D.   On: 11/02/2013 01:20   Dg Femur Right  11/02/2013   CLINICAL DATA:  Fall.  EXAM: RIGHT FEMUR - 2 VIEW  COMPARISON:  Pelvic radiograph November 01, 2013.  FINDINGS: Comminuted impacted intertrochanteric fracture with varus angulation distal bony fragments. In addition, suspected nondisplaced femoral neck fracture. No dislocation. No destructive bony lesions. Soft tissue planes are nonsuspicious.  IMPRESSION: Comminuted impacted mildly angulated right femur intertrochanteric fracture. In addition, suspected nondisplaced right femoral neck fracture. No dislocation.   Electronically Signed   By: Elon Alas   On: 11/02/2013 01:24   Ct Head Wo Contrast  11/02/2013   CLINICAL DATA:  [place] in the bathroom. Dizziness, weakness, right hip pain.  EXAM: CT HEAD WITHOUT CONTRAST  TECHNIQUE: Contiguous axial images were obtained from the base of the skull through the vertex without intravenous contrast.  COMPARISON:  11/20/2003  FINDINGS: Diffuse cerebral atrophy. No ventricular dilatation. Low-attenuation changes in the deep white matter consistent with small vessel ischemia. Old lacune or infarct in the left thalamus. No mass effect or midline shift.  No abnormal extra-axial fluid collections. Gray-white matter junctions are distinct. Basal cisterns are not effaced. No evidence of acute intracranial hemorrhage. No depressed skull fractures. Visualized paranasal sinuses and mastoid air cells are not opacified.  IMPRESSION: No acute intracranial abnormalities. Chronic atrophy and small vessel ischemic changes.   Electronically Signed   By: Lucienne Capers M.D.   On: 11/02/2013 00:44     EKG Interpretation None  MDM   Final diagnoses:  Closed right hip fracture, initial encounter  Fall, initial encounter  Leukocytosis   I personally performed the services described in this documentation, which was scribed in my presence. The recorded information has been reviewed and is accurate.  X-ray reviewed confirming right hip fracture. Orthopedics did not return call during my shift. The patient sleepy from narcotics in with age discuss femoral nerve block, patient wishes to proceed.  Discussed case with [facility] physician who agreed with admission to general medicine.  The patients results and plan were reviewed and discussed.   Any x-rays performed were personally reviewed by myself.   Differential diagnosis were considered with the presenting HPI.  Medications  HYDROcodone-acetaminophen (NORCO/VICODIN) 5-325 MG per tablet 1-2 tablet (not administered)  morphine 2 MG/ML injection 0.5 mg (not administered)  0.9 %  sodium chloride infusion ( Intravenous New Bag/Given 11/02/13 0538)  methocarbamol (ROBAXIN) tablet 500 mg (not administered)    Or  methocarbamol (ROBAXIN) 500 mg in dextrose 5 % 50 mL IVPB (not administered)  0.9 %  sodium chloride infusion ( Intravenous Transfusing/Transfer 11/02/13 0457)  bupivacaine (MARCAINE) 0.5 % injection 30 mL (30 mLs Infiltration Given by Other 11/02/13 0358)  morphine 4 MG/ML injection 4 mg (4 mg Intravenous Given 11/02/13 0437)      Filed Vitals:   11/01/13 2256 11/02/13 0428 11/02/13 0535  BP:  167/56 141/80 160/50  Pulse: 80 81 81  Temp: 98.1 F (36.7 C) 98.1 F (36.7 C) 98.6 F (37 C)  TempSrc:   Oral  Resp: 18 20 20   Height: 5' 7.5" (1.715 m)  5' 7.5" (1.715 m)  Weight: 173 lb (78.472 kg)  155 lb (70.308 kg)  SpO2: 99% 93% 100%    Admission/ observation were discussed with the admitting physician, patient and/or family and they are comfortable with the plan.      Mariea Clonts, MD 11/02/13 [phone]

## 2013-11-02 ENCOUNTER — Inpatient Hospital Stay (HOSPITAL_COMMUNITY): Payer: Medicare Other

## 2013-11-02 DIAGNOSIS — E119 Type 2 diabetes mellitus without complications: Secondary | ICD-10-CM | POA: Diagnosis not present

## 2013-11-02 DIAGNOSIS — K219 Gastro-esophageal reflux disease without esophagitis: Secondary | ICD-10-CM | POA: Diagnosis present

## 2013-11-02 DIAGNOSIS — I10 Essential (primary) hypertension: Secondary | ICD-10-CM | POA: Diagnosis present

## 2013-11-02 DIAGNOSIS — K589 Irritable bowel syndrome without diarrhea: Secondary | ICD-10-CM | POA: Diagnosis present

## 2013-11-02 DIAGNOSIS — M25559 Pain in unspecified hip: Secondary | ICD-10-CM | POA: Diagnosis present

## 2013-11-02 DIAGNOSIS — Y92009 Unspecified place in unspecified non-institutional (private) residence as the place of occurrence of the external cause: Secondary | ICD-10-CM | POA: Diagnosis not present

## 2013-11-02 DIAGNOSIS — S32509A Unspecified fracture of unspecified pubis, initial encounter for closed fracture: Secondary | ICD-10-CM | POA: Diagnosis not present

## 2013-11-02 DIAGNOSIS — D649 Anemia, unspecified: Secondary | ICD-10-CM | POA: Diagnosis not present

## 2013-11-02 DIAGNOSIS — Z7401 Bed confinement status: Secondary | ICD-10-CM | POA: Diagnosis not present

## 2013-11-02 DIAGNOSIS — R9431 Abnormal electrocardiogram [ECG] [EKG]: Secondary | ICD-10-CM | POA: Diagnosis present

## 2013-11-02 DIAGNOSIS — Z8673 Personal history of transient ischemic attack (TIA), and cerebral infarction without residual deficits: Secondary | ICD-10-CM | POA: Diagnosis not present

## 2013-11-02 DIAGNOSIS — Z79899 Other long term (current) drug therapy: Secondary | ICD-10-CM | POA: Diagnosis not present

## 2013-11-02 DIAGNOSIS — S46909A Unspecified injury of unspecified muscle, fascia and tendon at shoulder and upper arm level, unspecified arm, initial encounter: Secondary | ICD-10-CM | POA: Diagnosis not present

## 2013-11-02 DIAGNOSIS — K2270 Barrett's esophagus without dysplasia: Secondary | ICD-10-CM | POA: Diagnosis not present

## 2013-11-02 DIAGNOSIS — F0390 Unspecified dementia without behavioral disturbance: Secondary | ICD-10-CM | POA: Diagnosis present

## 2013-11-02 DIAGNOSIS — M6281 Muscle weakness (generalized): Secondary | ICD-10-CM | POA: Diagnosis not present

## 2013-11-02 DIAGNOSIS — S298XXA Other specified injuries of thorax, initial encounter: Secondary | ICD-10-CM | POA: Diagnosis not present

## 2013-11-02 DIAGNOSIS — M109 Gout, unspecified: Secondary | ICD-10-CM | POA: Diagnosis present

## 2013-11-02 DIAGNOSIS — S72009A Fracture of unspecified part of neck of unspecified femur, initial encounter for closed fracture: Secondary | ICD-10-CM

## 2013-11-02 DIAGNOSIS — D539 Nutritional anemia, unspecified: Secondary | ICD-10-CM | POA: Diagnosis present

## 2013-11-02 DIAGNOSIS — Z01810 Encounter for preprocedural cardiovascular examination: Secondary | ICD-10-CM | POA: Diagnosis not present

## 2013-11-02 DIAGNOSIS — Z9181 History of falling: Secondary | ICD-10-CM | POA: Diagnosis not present

## 2013-11-02 DIAGNOSIS — E7800 Pure hypercholesterolemia, unspecified: Secondary | ICD-10-CM | POA: Diagnosis present

## 2013-11-02 DIAGNOSIS — Z7982 Long term (current) use of aspirin: Secondary | ICD-10-CM | POA: Diagnosis not present

## 2013-11-02 DIAGNOSIS — W010XXA Fall on same level from slipping, tripping and stumbling without subsequent striking against object, initial encounter: Secondary | ICD-10-CM | POA: Diagnosis present

## 2013-11-02 DIAGNOSIS — Z825 Family history of asthma and other chronic lower respiratory diseases: Secondary | ICD-10-CM | POA: Diagnosis not present

## 2013-11-02 DIAGNOSIS — A048 Other specified bacterial intestinal infections: Secondary | ICD-10-CM | POA: Diagnosis not present

## 2013-11-02 DIAGNOSIS — M2550 Pain in unspecified joint: Secondary | ICD-10-CM | POA: Diagnosis not present

## 2013-11-02 DIAGNOSIS — W19XXXA Unspecified fall, initial encounter: Secondary | ICD-10-CM | POA: Diagnosis not present

## 2013-11-02 DIAGNOSIS — S4980XA Other specified injuries of shoulder and upper arm, unspecified arm, initial encounter: Secondary | ICD-10-CM | POA: Diagnosis not present

## 2013-11-02 DIAGNOSIS — R197 Diarrhea, unspecified: Secondary | ICD-10-CM | POA: Diagnosis not present

## 2013-11-02 DIAGNOSIS — Z808 Family history of malignant neoplasm of other organs or systems: Secondary | ICD-10-CM | POA: Diagnosis not present

## 2013-11-02 DIAGNOSIS — G4730 Sleep apnea, unspecified: Secondary | ICD-10-CM | POA: Diagnosis present

## 2013-11-02 DIAGNOSIS — D72829 Elevated white blood cell count, unspecified: Secondary | ICD-10-CM | POA: Diagnosis present

## 2013-11-02 DIAGNOSIS — S72001A Fracture of unspecified part of neck of right femur, initial encounter for closed fracture: Secondary | ICD-10-CM

## 2013-11-02 DIAGNOSIS — I7300 Raynaud's syndrome without gangrene: Secondary | ICD-10-CM

## 2013-11-02 DIAGNOSIS — F3289 Other specified depressive episodes: Secondary | ICD-10-CM | POA: Diagnosis not present

## 2013-11-02 DIAGNOSIS — Z880 Allergy status to penicillin: Secondary | ICD-10-CM | POA: Diagnosis not present

## 2013-11-02 DIAGNOSIS — J449 Chronic obstructive pulmonary disease, unspecified: Secondary | ICD-10-CM | POA: Diagnosis not present

## 2013-11-02 DIAGNOSIS — F329 Major depressive disorder, single episode, unspecified: Secondary | ICD-10-CM | POA: Diagnosis not present

## 2013-11-02 DIAGNOSIS — S72143A Displaced intertrochanteric fracture of unspecified femur, initial encounter for closed fracture: Secondary | ICD-10-CM | POA: Diagnosis not present

## 2013-11-02 DIAGNOSIS — S0990XA Unspecified injury of head, initial encounter: Secondary | ICD-10-CM | POA: Diagnosis not present

## 2013-11-02 DIAGNOSIS — D638 Anemia in other chronic diseases classified elsewhere: Secondary | ICD-10-CM | POA: Diagnosis present

## 2013-11-02 DIAGNOSIS — G4700 Insomnia, unspecified: Secondary | ICD-10-CM | POA: Diagnosis not present

## 2013-11-02 DIAGNOSIS — M349 Systemic sclerosis, unspecified: Secondary | ICD-10-CM | POA: Diagnosis not present

## 2013-11-02 DIAGNOSIS — Z87891 Personal history of nicotine dependence: Secondary | ICD-10-CM | POA: Diagnosis not present

## 2013-11-02 LAB — LIPID PANEL
Cholesterol: 82 mg/dL (ref 0–200)
HDL: 33 mg/dL — ABNORMAL LOW (ref >39)
LDL Cholesterol: 38 mg/dL (ref 0–99)
Total CHOL/HDL Ratio: 2.5 RATIO
Triglycerides: 57 mg/dL (ref <150)
VLDL: 11 mg/dL (ref 0–40)

## 2013-11-02 LAB — CBC WITH DIFFERENTIAL/PLATELET
BASOS PCT: 0 % (ref 0–1)
Basophils Absolute: 0 10*3/uL (ref 0.0–0.1)
EOS PCT: 0 % (ref 0–5)
Eosinophils Absolute: 0.1 10*3/uL (ref 0.0–0.7)
HCT: 30.6 % — ABNORMAL LOW (ref 36.0–46.0)
HEMOGLOBIN: 10.1 g/dL — AB (ref 12.0–15.0)
LYMPHS ABS: 2.2 10*3/uL (ref 0.7–4.0)
Lymphocytes Relative: 14 % (ref 12–46)
MCH: 33.3 pg (ref 26.0–34.0)
MCHC: 33 g/dL (ref 30.0–36.0)
MCV: 101 fL — AB (ref 78.0–100.0)
MONO ABS: 0.9 10*3/uL (ref 0.1–1.0)
MONOS PCT: 5 % (ref 3–12)
NEUTROS PCT: 81 % — AB (ref 43–77)
Neutro Abs: 13.1 10*3/uL — ABNORMAL HIGH (ref 1.7–7.7)
Platelets: 274 10*3/uL (ref 150–400)
RBC: 3.03 MIL/uL — AB (ref 3.87–5.11)
RDW: 15 % (ref 11.5–15.5)
WBC: 16.3 10*3/uL — ABNORMAL HIGH (ref 4.0–10.5)

## 2013-11-02 LAB — URINALYSIS, ROUTINE W REFLEX MICROSCOPIC
Bilirubin Urine: NEGATIVE
GLUCOSE, UA: NEGATIVE mg/dL
Hgb urine dipstick: NEGATIVE
Ketones, ur: NEGATIVE mg/dL
LEUKOCYTES UA: NEGATIVE
NITRITE: NEGATIVE
Protein, ur: NEGATIVE mg/dL
Specific Gravity, Urine: 1.01 (ref 1.005–1.030)
UROBILINOGEN UA: 1 mg/dL (ref 0.0–1.0)
pH: 7 (ref 5.0–8.0)

## 2013-11-02 LAB — ABO/RH: ABO/RH(D): A POS

## 2013-11-02 LAB — GLUCOSE, CAPILLARY
GLUCOSE-CAPILLARY: 135 mg/dL — AB (ref 70–99)
Glucose-Capillary: 112 mg/dL — ABNORMAL HIGH (ref 70–99)

## 2013-11-02 LAB — IRON AND TIBC
Iron: 24 ug/dL — ABNORMAL LOW (ref 42–135)
SATURATION RATIOS: 10 % — AB (ref 20–55)
TIBC: 245 ug/dL — AB (ref 250–470)
UIBC: 221 ug/dL (ref 125–400)

## 2013-11-02 LAB — RETICULOCYTES
RBC.: 2.94 MIL/uL — ABNORMAL LOW (ref 3.87–5.11)
Retic Count, Absolute: 129.4 10*3/uL (ref 19.0–186.0)
Retic Ct Pct: 4.4 % — ABNORMAL HIGH (ref 0.4–3.1)

## 2013-11-02 LAB — BASIC METABOLIC PANEL
Anion gap: 11 (ref 5–15)
BUN: 10 mg/dL (ref 6–23)
CHLORIDE: 100 meq/L (ref 96–112)
CO2: 26 mEq/L (ref 19–32)
Calcium: 8.7 mg/dL (ref 8.4–10.5)
Creatinine, Ser: 0.77 mg/dL (ref 0.50–1.10)
GFR calc Af Amer: >90 mL/min (ref >90)
GFR calc non Af Amer: 78 mL/min — ABNORMAL LOW (ref >90)
GLUCOSE: 139 mg/dL — AB (ref 70–99)
Potassium: 3.8 mEq/L (ref 3.7–5.3)
Sodium: 137 mEq/L (ref 137–147)

## 2013-11-02 LAB — PROTIME-INR
INR: 1.18 (ref 0.00–1.49)
Prothrombin Time: 15 seconds (ref 11.6–15.2)

## 2013-11-02 LAB — FOLATE: FOLATE: 11.8 ng/mL

## 2013-11-02 LAB — VITAMIN B12: VITAMIN B 12: 591 pg/mL (ref 211–911)

## 2013-11-02 LAB — PREPARE RBC (CROSSMATCH)

## 2013-11-02 LAB — HEMOGLOBIN A1C
HEMOGLOBIN A1C: 5.6 % (ref <5.7)
Mean Plasma Glucose: 114 mg/dL (ref <117)

## 2013-11-02 LAB — FERRITIN: Ferritin: 202 ng/mL (ref 10–291)

## 2013-11-02 LAB — TROPONIN I: Troponin I: <0.3 ng/mL (ref <0.30)

## 2013-11-02 MED ORDER — MORPHINE SULFATE 4 MG/ML IJ SOLN
4.0000 mg | Freq: Once | INTRAMUSCULAR | Status: AC
  Administered 2013-11-02: 4 mg via INTRAVENOUS
  Filled 2013-11-02: qty 1

## 2013-11-02 MED ORDER — ONDANSETRON HCL 4 MG/2ML IJ SOLN: 4.0000 mg | Freq: Three times a day (TID) | INTRAMUSCULAR | Status: DC | PRN

## 2013-11-02 MED ORDER — INSULIN ASPART 100 UNIT/ML ~~LOC~~ SOLN
0.0000 [IU] | Freq: Three times a day (TID) | SUBCUTANEOUS | Status: DC
Start: 2013-11-02 — End: 2013-11-06
  Administered 2013-11-02 – 2013-11-05 (×4): 1 [IU] via SUBCUTANEOUS
  Administered 2013-11-05 – 2013-11-06 (×2): 2 [IU] via SUBCUTANEOUS

## 2013-11-02 MED ORDER — BUPIVACAINE HCL (PF) 0.5 % IJ SOLN
30.0000 mL | Freq: Once | INTRAMUSCULAR | Status: AC
  Administered 2013-11-02: 30 mL
  Filled 2013-11-02: qty 30

## 2013-11-02 MED ORDER — CHLORHEXIDINE GLUCONATE 4 % EX LIQD: 60.0000 mL | Freq: Once | CUTANEOUS | Status: DC

## 2013-11-02 MED ORDER — PANTOPRAZOLE SODIUM 40 MG PO TBEC
40.0000 mg | DELAYED_RELEASE_TABLET | Freq: Every day | ORAL | Status: DC
  Administered 2013-11-02 – 2013-11-06 (×5): 40 mg via ORAL
  Filled 2013-11-02 (×6): qty 1

## 2013-11-02 MED ORDER — METHOCARBAMOL 1000 MG/10ML IJ SOLN
500.0000 mg | Freq: Four times a day (QID) | INTRAVENOUS | Status: DC | PRN
  Filled 2013-11-02: qty 5

## 2013-11-02 MED ORDER — VANCOMYCIN HCL IN DEXTROSE 1-5 GM/200ML-% IV SOLN: 1000.0000 mg | INTRAVENOUS | Status: DC

## 2013-11-02 MED ORDER — CITALOPRAM HYDROBROMIDE 20 MG PO TABS
20.0000 mg | ORAL_TABLET | Freq: Every day | ORAL | Status: DC
  Administered 2013-11-02 – 2013-11-05 (×4): 20 mg via ORAL
  Filled 2013-11-02 (×4): qty 1

## 2013-11-02 MED ORDER — ALPRAZOLAM 0.25 MG PO TABS
0.2500 mg | ORAL_TABLET | Freq: Two times a day (BID) | ORAL | Status: DC | PRN
  Administered 2013-11-02 – 2013-11-06 (×5): 0.25 mg via ORAL
  Filled 2013-11-02 (×5): qty 1

## 2013-11-02 MED ORDER — HYDROCODONE-ACETAMINOPHEN 5-325 MG PO TABS
1.0000 | ORAL_TABLET | Freq: Four times a day (QID) | ORAL | Status: DC | PRN
  Administered 2013-11-02 (×2): 2 via ORAL
  Administered 2013-11-02: 1 via ORAL
  Administered 2013-11-03 (×3): 2 via ORAL
  Filled 2013-11-02 (×4): qty 2
  Filled 2013-11-02: qty 1
  Filled 2013-11-02 (×2): qty 2

## 2013-11-02 MED ORDER — METHOCARBAMOL 500 MG PO TABS
500.0000 mg | ORAL_TABLET | Freq: Four times a day (QID) | ORAL | Status: DC | PRN
  Administered 2013-11-02 – 2013-11-03 (×2): 500 mg via ORAL
  Filled 2013-11-02 (×2): qty 1

## 2013-11-02 MED ORDER — SODIUM CHLORIDE 0.9 % IV SOLN
INTRAVENOUS | Status: AC
Start: 2013-11-02 — End: 2013-11-02
  Administered 2013-11-02: 06:00:00 via INTRAVENOUS

## 2013-11-02 MED ORDER — SIMVASTATIN 20 MG PO TABS
40.0000 mg | ORAL_TABLET | Freq: Every day | ORAL | Status: DC
  Administered 2013-11-02 – 2013-11-05 (×4): 40 mg via ORAL
  Filled 2013-11-02 (×4): qty 2

## 2013-11-02 MED ORDER — SODIUM CHLORIDE 0.9 % IV SOLN: Freq: Once | INTRAVENOUS | Status: DC

## 2013-11-02 MED ORDER — FENTANYL CITRATE 0.05 MG/ML IJ SOLN
50.0000 ug | INTRAMUSCULAR | Status: DC | PRN
  Administered 2013-11-02: 50 ug via INTRAVENOUS
  Filled 2013-11-02: qty 2

## 2013-11-02 MED ORDER — ALLOPURINOL 100 MG PO TABS
100.0000 mg | ORAL_TABLET | Freq: Two times a day (BID) | ORAL | Status: DC
  Administered 2013-11-02 – 2013-11-06 (×8): 100 mg via ORAL
  Filled 2013-11-02 (×9): qty 1

## 2013-11-02 MED ORDER — DONEPEZIL HCL 5 MG PO TABS
10.0000 mg | ORAL_TABLET | Freq: Every day | ORAL | Status: DC
  Administered 2013-11-02 – 2013-11-05 (×4): 10 mg via ORAL
  Filled 2013-11-02 (×3): qty 2

## 2013-11-02 MED ORDER — MORPHINE SULFATE 2 MG/ML IJ SOLN
0.5000 mg | INTRAMUSCULAR | Status: DC | PRN
  Administered 2013-11-02 – 2013-11-04 (×8): 0.5 mg via INTRAVENOUS
  Filled 2013-11-02 (×8): qty 1

## 2013-11-02 MED ORDER — LISINOPRIL 10 MG PO TABS
10.0000 mg | ORAL_TABLET | Freq: Every morning | ORAL | Status: DC
  Administered 2013-11-03 – 2013-11-06 (×4): 10 mg via ORAL
  Filled 2013-11-02 (×5): qty 1

## 2013-11-02 MED ORDER — METOPROLOL TARTRATE 25 MG PO TABS
12.5000 mg | ORAL_TABLET | Freq: Two times a day (BID) | ORAL | Status: DC
  Administered 2013-11-02 – 2013-11-05 (×7): 12.5 mg via ORAL
  Filled 2013-11-02 (×8): qty 1

## 2013-11-02 NOTE — Progress Notes (Addendum)
Patient seen, independently examined and chart reviewed. I agree with exam, assessment and plan discussed with Dyanne Carrel, NP.  80 year old woman presented emergency department with right hip pain status post mechanical fall after going to the bathroom after taking Valium, Ambien. Imaging revealed acute hip fracture.  Subjective: She complains of pain in her legs. Denies history of significant heart disease. No recent chest pain or shortness of breath.  Objective: afebrile, vitals stable.   Gen. Appears calm and comfortable.  Psych. Mildly confused. Asks her friend at bedside to help answer questions. Speech fluent and clear.  Cardiovascular. Regular rate and rhythm. No murmur, regular. No lower extremity edema.  Respiratory. Clear to auscultation bilaterally. No wheezes, rales or rhonchi. Normal respiratory effort.   Chemistry:  unremarkable basic metabolic panel on admission. Troponin negative.  Heme:  hemoglobin 10.1. WBC 16.3.  Other:  EKG normal sinus rhythm, left axis deviation. QTC unremarkable.  Comminuted right proximal femur fracture, intertrochanteric fracture, femoral neck fracture status post mechanical fall. Leukocytosis is likely secondary to fracture. She underwent nuclear stress testing for chest pain in January 2015, noted to be low risk with normal left ventricular systolic function and wall motion. No history of coronary disease. Discussed in detail with her daughter who is a Marine scientist and reports 2 negative cardiac catheterizations in the past. Patient has no history of coronary disease or MI. No recent chest pain. She is at moderate surgical risk based on her age and comorbidities. No further evaluation suggested. She is cleared for surgery today.  Nondisplaced right inferior pubic ramus fracture  Macrocytic anemia below baseline. Possible chronic anemia versus acute blood loss anemia. Followup CBC. Orthopedics planning transfusion.  Repeat EKG unremarkable. QTC  within normal limits.  Murray Hodgkins, MD [facility] [phone]

## 2013-11-02 NOTE — Progress Notes (Signed)
Patient ID: Tina [name], female   DOB: Sep 12, 1933, 80 y.o.   MRN: [id number]  Fractured right hip  Peritrochanteric and femoral neck, highly unusual fracture   cephallomedullary nail will be needed expect blood loss  Hg 10   rec transfusion and I/O for 48 hrs then surgery Weds

## 2013-11-02 NOTE — ED Notes (Signed)
Daughter [phone] Son-in-law [phone]

## 2013-11-02 NOTE — Consult Note (Signed)
Editor: Phillips Grout, MD (Physician)         PCP:   Purvis Kilts, MD    Chief Complaint:   fall  HPI: 80 yo female h/o htn, dm, copd, raynauds comes in after falling going to the bathroom tonight after taking some valium/ambien.  History mainly obtained from EDP (obtained from dtr).  Pt hurt her right hip.  She was in her normal state of health prior to this.  Has fracture right hip.  Pt denies any head injury.  Review of Systems:  Positive and negative as per HPI otherwise all other systems are negative      Past Medical History  Diagnosis Date  . Lower abdominal pain   . Chronic diarrhea   . Barrett's esophagus   . Irritable bowel syndrome   . Helicobacter pylori (H. pylori)   . Gastritis   . Diabetes mellitus   . Hypertension   . Sleep apnea     pt is not on machine-diagnosed many years ago  . 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 Scleroderma   . COPD (chronic obstructive pulmonary disease)   . Raynaud's disease   . Renal disorder   . Gout   . Stroke     Past Surgical History  Procedure Laterality Date  . Esophagogastroduodenoscopy  06/09  . Colonoscopy  12/2008  . Patella fracture surgery    . Esophagogastroduodenoscopy  02/02/05    EGD ED  . Esophagogastroduodenoscopy  03/08/00    EGD ED  . Cholecystectomy    . Lung surgery  2002    Lung collapsed  . Back surgery      No family history on file.  Social History:  reports that she has quit smoking. She does not have any smokeless tobacco history on file. She reports that she does not drink alcohol or use illicit drugs.  Allergies:  Allergies  Allergen Reactions  . Penicillins     Medications: I have reviewed the patient's current medications.  Results for orders placed during the hospital encounter of 11/01/13 (from the past 48 hour(s))  BASIC METABOLIC PANEL     Status: Abnormal   Collection Time    11/02/13  1:21 AM      Result Value Ref Range   Sodium 137  137 - 147 mEq/L   Potassium 3.8   3.7 - 5.3 mEq/L   Chloride 100  96 - 112 mEq/L   CO2 26  19 - 32 mEq/L   Glucose, Bld 139 (*) 70 - 99 mg/dL   BUN 10  6 - 23 mg/dL   Creatinine, Ser 0.77  0.50 - 1.10 mg/dL   Calcium 8.7  8.4 - 10.5 mg/dL   GFR calc non Af Amer 78 (*) >90 mL/min   GFR calc Af Amer >90  >90 mL/min   Comment: (NOTE)     The eGFR has been calculated using the CKD EPI equation.     This calculation has not been validated in all clinical situations.     eGFR's persistently <90 mL/min signify possible Chronic Kidney     Disease.   Anion gap 11  5 - 15  CBC WITH DIFFERENTIAL     Status: Abnormal   Collection Time    11/02/13  1:21 AM      Result Value Ref Range   WBC 16.3 (*) 4.0 - 10.5 K/uL   RBC 3.03 (*) 3.87 - 5.11 MIL/uL   Hemoglobin 10.1 (*) 12.0 - 15.0 g/dL  HCT 30.6 (*) 36.0 - 46.0 %   MCV 101.0 (*) 78.0 - 100.0 fL   MCH 33.3  26.0 - 34.0 pg   MCHC 33.0  30.0 - 36.0 g/dL   RDW 15.0  11.5 - 15.5 %   Platelets 274  150 - 400 K/uL   Neutrophils Relative % 81 (*) 43 - 77 %   Neutro Abs 13.1 (*) 1.7 - 7.7 K/uL   Lymphocytes Relative 14  12 - 46 %   Lymphs Abs 2.2  0.7 - 4.0 K/uL   Monocytes Relative 5  3 - 12 %   Monocytes Absolute 0.9  0.1 - 1.0 K/uL   Eosinophils Relative 0  0 - 5 %   Eosinophils Absolute 0.1  0.0 - 0.7 K/uL   Basophils Relative 0  0 - 1 %   Basophils Absolute 0.0  0.0 - 0.1 K/uL  TROPONIN I     Status: None   Collection Time    11/02/13  1:21 AM      Result Value Ref Range   Troponin I <0.30  <0.30 ng/mL   Comment:            Due to the release kinetics of cTnI,     a negative result within the first hours     of the onset of symptoms does not rule out     myocardial infarction with certainty.     If myocardial infarction is still suspected,     repeat the test at appropriate intervals.  URINALYSIS, ROUTINE W REFLEX MICROSCOPIC     Status: None   Collection Time    11/02/13  2:28 AM      Result Value Ref Range   Color, Urine YELLOW  YELLOW   APPearance CLEAR   CLEAR   Specific Gravity, Urine 1.010  1.005 - 1.030   pH 7.0  5.0 - 8.0   Glucose, UA NEGATIVE  NEGATIVE mg/dL   Hgb urine dipstick NEGATIVE  NEGATIVE   Bilirubin Urine NEGATIVE  NEGATIVE   Ketones, ur NEGATIVE  NEGATIVE mg/dL   Protein, ur NEGATIVE  NEGATIVE mg/dL   Urobilinogen, UA 1.0  0.0 - 1.0 mg/dL   Nitrite NEGATIVE  NEGATIVE   Leukocytes, UA NEGATIVE  NEGATIVE   Comment: MICROSCOPIC NOT DONE ON URINES WITH NEGATIVE PROTEIN, BLOOD, LEUKOCYTES, NITRITE, OR GLUCOSE <1000 mg/dL.  LIPID PANEL     Status: Abnormal   Collection Time    11/02/13  6:20 AM      Result Value Ref Range   Cholesterol 82  0 - 200 mg/dL   Triglycerides 57  <150 mg/dL   HDL 33 (*) >39 mg/dL   Total CHOL/HDL Ratio 2.5     VLDL 11  0 - 40 mg/dL   LDL Cholesterol 38  0 - 99 mg/dL   Comment:            Total Cholesterol/HDL:CHD Risk     Coronary Heart Disease Risk Table                         Men   Women      1/2 Average Risk   3.4   3.3      Average Risk       5.0   4.4      2 X Average Risk   9.6   7.1      3 X Average Risk  23.4   11.0  Use the calculated Patient Ratio     above and the CHD Risk Table     to determine the patient's CHD Risk.                ATP III CLASSIFICATION (LDL):      <100     mg/dL   Optimal      100-129  mg/dL   Near or Above                        Optimal      130-159  mg/dL   Borderline      160-189  mg/dL   High      >190     mg/dL   Very High  PROTIME-INR     Status: None   Collection Time    11/02/13  6:29 AM      Result Value Ref Range   Prothrombin Time 15.0  11.6 - 15.2 seconds   INR 1.18  0.00 - 1.49  TYPE AND SCREEN     Status: None   Collection Time    11/02/13  6:29 AM      Result Value Ref Range   ABO/RH(D) A POS     Antibody Screen NEG     Sample Expiration 11/05/2013    RETICULOCYTES     Status: Abnormal   Collection Time    11/02/13 10:33 AM      Result Value Ref Range   Retic Ct Pct 4.4 (*) 0.4 - 3.1 %   RBC. 2.94 (*) 3.87 -  5.11 MIL/uL   Retic Count, Manual 129.4  19.0 - 186.0 K/uL  GLUCOSE, CAPILLARY     Status: Abnormal   Collection Time    11/02/13 11:29 AM      Result Value Ref Range   Glucose-Capillary 112 (*) 70 - 99 mg/dL   Comment 1 Documented in Chart     Comment 2 Notify RN      Dg Chest 1 View  11/02/2013   CLINICAL DATA:  Fall.  EXAM: CHEST - 1 VIEW  COMPARISON:  Chest radiograph May 05, 2013  FINDINGS: The cardiac silhouette appears mildly enlarged, similar. Mediastinal silhouette is nonsuspicious, mildly calcified aortic knob. Diffuse mild interstitial prominence, slightly increasing. Similar nodularity right midlung zone, postoperative change the right lung apex is similar pleural thickening. No pneumothorax. Soft tissue planes and included osseous structures are nonsuspicious.  IMPRESSION: Stable cardiomegaly, increasing interstitial prominence may reflect pulmonary edema. Similar nodularity/calcified pleural plaque and postoperative changes of right midlung zone.   Electronically Signed   By: Elon Alas   On: 11/02/2013 01:22   Dg Shoulder Right  11/02/2013   CLINICAL DATA:  Fall.  EXAM: RIGHT SHOULDER - 2+ VIEW  COMPARISON:  None.  FINDINGS: There is no evidence of fracture or dislocation. Mild acromioclavicular undersurface spurring. There is no evidence of arthropathy or other focal bone abnormality. Soft tissues are unremarkable. Included view of the chest demonstrates postsurgical change and nodularity.  IMPRESSION: No acute fracture deformity or dislocation.   Electronically Signed   By: Elon Alas   On: 11/02/2013 01:21   Dg Hip Complete Right  11/02/2013   CLINICAL DATA:  Right hip pain after a fall.  EXAM: RIGHT HIP - COMPLETE 2+ VIEW  COMPARISON:  None.  FINDINGS: Acute comminuted inter trochanteric fracture of the right hip with varus angulation and impaction. No dislocation of the hip joint. Degenerative changes are present  in the lower lumbar spine and both hips. Pelvis,  SI joints, and symphysis pubis appear intact.  IMPRESSION: Comminuted intertrochanteric fractures of the right hip.   Electronically Signed   By: Lucienne Capers M.D.   On: 11/02/2013 01:20   Dg Femur Right  11/02/2013   CLINICAL DATA:  Fall.  EXAM: RIGHT FEMUR - 2 VIEW  COMPARISON:  Pelvic radiograph November 01, 2013.  FINDINGS: Comminuted impacted intertrochanteric fracture with varus angulation distal bony fragments. In addition, suspected nondisplaced femoral neck fracture. No dislocation. No destructive bony lesions. Soft tissue planes are nonsuspicious.  IMPRESSION: Comminuted impacted mildly angulated right femur intertrochanteric fracture. In addition, suspected nondisplaced right femoral neck fracture. No dislocation.   Electronically Signed   By: Elon Alas   On: 11/02/2013 01:24   Ct Head Wo Contrast  11/02/2013   CLINICAL DATA:  [place] in the bathroom. Dizziness, weakness, right hip pain.  EXAM: CT HEAD WITHOUT CONTRAST  TECHNIQUE: Contiguous axial images were obtained from the base of the skull through the vertex without intravenous contrast.  COMPARISON:  11/20/2003  FINDINGS: Diffuse cerebral atrophy. No ventricular dilatation. Low-attenuation changes in the deep white matter consistent with small vessel ischemia. Old lacune or infarct in the left thalamus. No mass effect or midline shift. No abnormal extra-axial fluid collections. Gray-white matter junctions are distinct. Basal cisterns are not effaced. No evidence of acute intracranial hemorrhage. No depressed skull fractures. Visualized paranasal sinuses and mastoid air cells are not opacified.  IMPRESSION: No acute intracranial abnormalities. Chronic atrophy and small vessel ischemic changes.   Electronically Signed   By: Lucienne Capers M.D.   On: 11/02/2013 00:44   Ct Hip Right Wo Contrast  11/02/2013   CLINICAL DATA:  Right hip fracture, status post fall. Nonspecific (abnormal) findings on radiological and other examination of  musculoskeletal sysem.  EXAM: CT OF THE RIGHT HIP WITHOUT CONTRAST; 3-DIMENSIONAL CT IMAGE RENDERING ON ACQUISITION WORKSTATION  TECHNIQUE: Multidetector CT imaging was performed according to the standard protocol. Multiplanar CT image reconstructions were also generated.; 3-dimensional CT images were rendered by post-processing of the original CT data on an acquisition workstation. The 3-dimensional CT images were interpreted and findings were reported in the accompanying complete CT report for this study  COMPARISON:  None.  FINDINGS: There is a comminuted fracture of the proximal right femur. There is a comminuted right intertrochanteric fracture with medial displacement of the lesser trochanter. There is mildly displaced femoral neck fracture. There is no dislocation. There is no acetabular fracture. There is a nondisplaced fracture of the right inferior pubic ramus.  There is no lytic or blastic osseous lesion.  There is no soft tissue hematoma, fluid collection or soft tissue mass. The muscles are normal. Mild soft tissue contusion in the subcutaneous fat overlying the greater trochanter.  IMPRESSION: 1. Comminuted fracture of the proximal right femur. Comminuted right intertrochanteric fracture with medial displacement of the lesser trochanter. Mildly displaced femoral neck fracture. No dislocation. 2. Nondisplaced right inferior pubic ramus fracture.   Electronically Signed   By: Kathreen Devoid   On: 11/02/2013 12:12   Ct 3d Recon At Scanner  11/02/2013   CLINICAL DATA:  Right hip fracture, status post fall. Nonspecific (abnormal) findings on radiological and other examination of musculoskeletal sysem.  EXAM: CT OF THE RIGHT HIP WITHOUT CONTRAST; 3-DIMENSIONAL CT IMAGE RENDERING ON ACQUISITION WORKSTATION  TECHNIQUE: Multidetector CT imaging was performed according to the standard protocol. Multiplanar CT image reconstructions were also generated.; 3-dimensional CT images  were rendered by post-processing of  the original CT data on an acquisition workstation. The 3-dimensional CT images were interpreted and findings were reported in the accompanying complete CT report for this study  COMPARISON:  None.  FINDINGS: There is a comminuted fracture of the proximal right femur. There is a comminuted right intertrochanteric fracture with medial displacement of the lesser trochanter. There is mildly displaced femoral neck fracture. There is no dislocation. There is no acetabular fracture. There is a nondisplaced fracture of the right inferior pubic ramus.  There is no lytic or blastic osseous lesion.  There is no soft tissue hematoma, fluid collection or soft tissue mass. The muscles are normal. Mild soft tissue contusion in the subcutaneous fat overlying the greater trochanter.  IMPRESSION: 1. Comminuted fracture of the proximal right femur. Comminuted right intertrochanteric fracture with medial displacement of the lesser trochanter. Mildly displaced femoral neck fracture. No dislocation. 2. Nondisplaced right inferior pubic ramus fracture.   Electronically Signed   By: Kathreen Devoid   On: 11/02/2013 12:12    ROS As noted in history and physical system review otherwise normal Blood pressure 160/50, pulse 81, temperature 98.6 F (37 C), temperature source Oral, resp. rate 20, height 5' 7.5" (1.715 m), weight 155 lb (70.308 kg), SpO2 98.00%. Physical Exam This patient has multiple deformities upper and lower extremity secondary to scleroderma. Oriented x3 mood pleasant flat affect. Gait affected by peritrochanteric fracture of the right lower extremity/hip area.  She was contractures in her upper extremities cannot extend the interphalangeal joints but can perform grip activities. There are contractures elbow and shoulder. All joints are stable muscle tone is normal skin is intact though appears to have scleroderma changes shiny tight decreased excursion. Good pulses normal temperature no lymph nodes normal sensation  negative for pathologic reflexes coordination affected by age, arthritis and scleroderma  Right lower extremity external rotation pain tenderness proximal femur thigh mild swelling knee and ankle stable strength not assessed muscle tone normal skin intact although her feet show changes of scleroderma as well with contractures and should she shiny skin pulse temperature normal no edema lymph nodes negative sensation normal.  Left lower extremity normal  Assessment/Plan: Peritrochanteric fracture right hip we had to get a CT scan with 3-D reconstruction to define the femoral neck component. She base has a peritrochanteric fracture with a superior neck fracture which I believe can be stabilized with a gamma nail and then supplemental fixation as needed was a proximal screw  Her hemoglobin is 10 I expect high blood loss recommend transfusion and then on Wednesday we can proceed with surgery  Arther Abbott 11/02/2013, 1:41 PM

## 2013-11-02 NOTE — ED Notes (Signed)
Attempted foley cath 2 times and was unsucssesful. Pt urinated in the bed, pt was cleaned up and linen changed. Pt now going to xray.

## 2013-11-02 NOTE — H&P (Signed)
PCP:   Purvis Kilts, MD   Chief Complaint:  fall  HPI: 80 yo female h/o htn, dm, copd, raynauds comes in after falling going to the bathroom tonight after taking some valium/ambien.  History mainly obtained from EDP (obtained from dtr).  Pt hurt her right hip.  She was in her normal state of health prior to this.  Has fracture right hip.  Pt denies any head injury.  Review of Systems:  Positive and negative as per HPI otherwise all other systems are negative  Past Medical History: Past Medical History  Diagnosis Date  . Lower abdominal pain   . Chronic diarrhea   . Barrett's esophagus   . Irritable bowel syndrome   . Helicobacter pylori (H. pylori)   . Gastritis   . Diabetes mellitus   . Hypertension   . Sleep apnea     pt is not on machine-diagnosed many years ago  . 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 Scleroderma   . COPD (chronic obstructive pulmonary disease)   . Raynaud's disease   . Renal disorder   . Gout   . Stroke    Past Surgical History  Procedure Laterality Date  . Esophagogastroduodenoscopy  06/09  . Colonoscopy  12/2008  . Patella fracture surgery    . Esophagogastroduodenoscopy  02/02/05    EGD ED  . Esophagogastroduodenoscopy  03/08/00    EGD ED  . Cholecystectomy    . Lung surgery  2002    Lung collapsed  . Back surgery      Medications: Prior to Admission medications   Medication Sig Start Date End Date Taking? Authorizing Provider  allopurinol (ZYLOPRIM) 100 MG tablet Take 100 mg by mouth 2 (two) times daily.    Yes Historical Provider, MD  ALPRAZolam (XANAX) 0.25 MG tablet Take 0.25 mg by mouth 3 (three) times daily as needed for anxiety.   Yes Historical Provider, MD  aspirin EC 81 MG tablet Take 81 mg by mouth 2 (two) times daily.    Yes Historical Provider, MD  cephALEXin (KEFLEX) 500 MG capsule Take 1 capsule (500 mg total) by mouth 4 (four) times daily. 09/08/13  Yes Veryl Speak, MD  citalopram (CELEXA) 20 MG tablet Take 20 mg by mouth  at bedtime.    Yes Historical Provider, MD  diltiazem (CARTIA XT) 180 MG 24 hr capsule Take 180 mg by mouth 2 (two) times daily.   Yes Historical Provider, MD  donepezil (ARICEPT) 10 MG tablet Take 10 mg by mouth at bedtime.   Yes Historical Provider, MD  Ferrous Sulfate (IRON) 325 (65 FE) MG TABS Take by mouth.   Yes Historical Provider, MD  hydrochlorothiazide 25 MG tablet Take 25 mg by mouth every morning.    Yes Historical Provider, MD  HYDROcodone-acetaminophen (NORCO) 5-325 MG per tablet Take 2 tablets by mouth every 4 (four) hours as needed. 09/08/13  Yes Veryl Speak, MD  lisinopril (PRINIVIL,ZESTRIL) 10 MG tablet Take 10 mg by mouth every morning.    Yes Historical Provider, MD  metoprolol tartrate (LOPRESSOR) 25 MG tablet Take 12.5 mg by mouth 2 (two) times daily.    Yes Historical Provider, MD  omeprazole (PRILOSEC) 40 MG capsule Take 40 mg by mouth daily.   Yes Historical Provider, MD  simvastatin (ZOCOR) 40 MG tablet Take 40 mg by mouth at bedtime.     Yes Historical Provider, MD  zolpidem (AMBIEN) 10 MG tablet Take 5 mg by mouth at bedtime. For sleep   Yes  Historical Provider, MD  Cholecalciferol (VITAMIN D PO) Take 1 tablet by mouth every morning.    Historical Provider, MD  sucralfate (CARAFATE) 1 GM/10ML suspension Take 1 g by mouth 4 (four) times daily.    Historical Provider, MD    Allergies:   Allergies  Allergen Reactions  . Penicillins     Social History:  reports that she has quit smoking. She does not have any smokeless tobacco history on file. She reports that she does not drink alcohol or use illicit drugs.  Family History: none  Physical Exam: Filed Vitals:   11/01/13 2256 11/02/13 0428  BP: 167/56 141/80  Pulse: 80 81  Temp: 98.1 F (36.7 C) 98.1 F (36.7 C)  Resp: 18 20  Height: 5' 7.5" (1.715 m)   Weight: 78.472 kg (173 lb)   SpO2: 99% 93%   General appearance: alert, cooperative and no distress Head: Normocephalic, without obvious abnormality,  atraumatic Eyes: negative Nose: Nares normal. Septum midline. Mucosa normal. No drainage or sinus tenderness. Neck: no JVD and supple, symmetrical, trachea midline Lungs: clear to auscultation bilaterally Heart: regular rate and rhythm, S1, S2 normal, no murmur, click, rub or gallop Abdomen: soft, non-tender; bowel sounds normal; no masses,  no organomegaly Extremities: extremities normal, atraumatic, no cyanosis or edema Pulses: 2+ and symmetric Skin: Skin color, texture, turgor normal. No rashes or lesions Neurologic: Grossly normal   Labs on Admission:   Recent Labs  11/02/13 0121  NA 137  K 3.8  CL 100  CO2 26  GLUCOSE 139*  BUN 10  CREATININE 0.77  CALCIUM 8.7    Recent Labs  11/02/13 0121  WBC 16.3*  NEUTROABS 13.1*  HGB 10.1*  HCT 30.6*  MCV 101.0*  PLT 274    Recent Labs  11/02/13 0121  TROPONINI <0.30   Radiological Exams on Admission: Dg Chest 1 View  11/02/2013   CLINICAL DATA:  Fall.  EXAM: CHEST - 1 VIEW  COMPARISON:  Chest radiograph May 05, 2013  FINDINGS: The cardiac silhouette appears mildly enlarged, similar. Mediastinal silhouette is nonsuspicious, mildly calcified aortic knob. Diffuse mild interstitial prominence, slightly increasing. Similar nodularity right midlung zone, postoperative change the right lung apex is similar pleural thickening. No pneumothorax. Soft tissue planes and included osseous structures are nonsuspicious.  IMPRESSION: Stable cardiomegaly, increasing interstitial prominence may reflect pulmonary edema. Similar nodularity/calcified pleural plaque and postoperative changes of right midlung zone.   Electronically Signed   By: Elon Alas   On: 11/02/2013 01:22   Dg Shoulder Right  11/02/2013   CLINICAL DATA:  Fall.  EXAM: RIGHT SHOULDER - 2+ VIEW  COMPARISON:  None.  FINDINGS: There is no evidence of fracture or dislocation. Mild acromioclavicular undersurface spurring. There is no evidence of arthropathy or other  focal bone abnormality. Soft tissues are unremarkable. Included view of the chest demonstrates postsurgical change and nodularity.  IMPRESSION: No acute fracture deformity or dislocation.   Electronically Signed   By: Elon Alas   On: 11/02/2013 01:21   Dg Hip Complete Right  11/02/2013   CLINICAL DATA:  Right hip pain after a fall.  EXAM: RIGHT HIP - COMPLETE 2+ VIEW  COMPARISON:  None.  FINDINGS: Acute comminuted inter trochanteric fracture of the right hip with varus angulation and impaction. No dislocation of the hip joint. Degenerative changes are present in the lower lumbar spine and both hips. Pelvis, SI joints, and symphysis pubis appear intact.  IMPRESSION: Comminuted intertrochanteric fractures of the right hip.  Electronically Signed   By: Lucienne Capers M.D.   On: 11/02/2013 01:20   Dg Femur Right  11/02/2013   CLINICAL DATA:  Fall.  EXAM: RIGHT FEMUR - 2 VIEW  COMPARISON:  Pelvic radiograph November 01, 2013.  FINDINGS: Comminuted impacted intertrochanteric fracture with varus angulation distal bony fragments. In addition, suspected nondisplaced femoral neck fracture. No dislocation. No destructive bony lesions. Soft tissue planes are nonsuspicious.  IMPRESSION: Comminuted impacted mildly angulated right femur intertrochanteric fracture. In addition, suspected nondisplaced right femoral neck fracture. No dislocation.   Electronically Signed   By: Elon Alas   On: 11/02/2013 01:24   Ct Head Wo Contrast  11/02/2013   CLINICAL DATA:  [place] in the bathroom. Dizziness, weakness, right hip pain.  EXAM: CT HEAD WITHOUT CONTRAST  TECHNIQUE: Contiguous axial images were obtained from the base of the skull through the vertex without intravenous contrast.  COMPARISON:  11/20/2003  FINDINGS: Diffuse cerebral atrophy. No ventricular dilatation. Low-attenuation changes in the deep white matter consistent with small vessel ischemia. Old lacune or infarct in the left thalamus. No mass effect or  midline shift. No abnormal extra-axial fluid collections. Gray-white matter junctions are distinct. Basal cisterns are not effaced. No evidence of acute intracranial hemorrhage. No depressed skull fractures. Visualized paranasal sinuses and mastoid air cells are not opacified.  IMPRESSION: No acute intracranial abnormalities. Chronic atrophy and small vessel ischemic changes.   Electronically Signed   By: Lucienne Capers M.D.   On: 11/02/2013 00:44    Assessment/Plan  80 yo female fall at home with right intertronchanteric hip fracture and right femoral neck fracture  Principal Problem:   Hip fracture, right-  Hip fx pathway.  Keep npo for possible surgery later on today.  i have ordered another 12 lead ekg to be done preop to reck her qtc, see below.  Active Problems:  Stable unless o/w noted   Hypertension   High cholesterol   Raynaud's disease   COPD (chronic obstructive pulmonary disease)   DM (diabetes mellitus)   Fall at home   Closed right hip fracture   Prolonged Q-T interval on ECG-  Repeat a 12 lead later on this morning to reassess this prior to surgery, holding all of home meds, but do not see anything obvious that would cause this.  If cont to be prolonged, would place on tele perioperatively.  Admit to med surg.  Brielyn Bosak A 11/02/2013, 4:59 AM

## 2013-11-02 NOTE — Progress Notes (Signed)
Patient examined and chart reviewed.    Subjective: my leg is throbbing Objective: VSS, afebrile. Oxygen saturation levels 100% on 2L  PE: Gen: awake, alert. Appears slightly uncomfortable.  CV RRR No MGR No LE edema Resp: normal effort BS clear bilaterally. No wheeze MS: right leg rotated externally and slightly shorter than left. Right foot warm to touch. PPP. Right hip tender to palpation with decreased rom due to pain. Right shoulder slightly tender to touch. Other joints without swelling/erythema   Hip fracture, right- from mechanical fall. Await ortho recommendations. Will keep npo for possible surgery later on today. Continue pain management.   Prolonged Q-T interval on ECG- Repeat 12 lead EKG pending. Etiology unclear. Continue to hold all of home meds.  If cont to be prolonged, would place on tele perioperatively.   Hypertension: currently controlled. Home meds include lisinopril and lopressor. Will hold for now. Monitor closely  COPD (chronic obstructive pulmonary disease): stable at baseline. Not on home oxygen.   DM (diabetes mellitus): not on medication due to hx hypoglycemic episodes. Will monitor CBG's ans use SSI for control. Check HgA1c   High cholesterol: check lipid panel. Hold statin  Leukocytosis: likely reactive. Urinalysis unremarkable, chest xray without infiltrate. Will monitor.  Anemia: likely related to #1 in setting of chronic disease. Baseline appears to be 12. Will monitor closely. Check anemia panel.   Dementia: appear to be at baseline.     Dyanne Carrel NP

## 2013-11-02 NOTE — Care Management Utilization Note (Signed)
UR completed 

## 2013-11-03 ENCOUNTER — Encounter (HOSPITAL_COMMUNITY): Payer: Self-pay | Admitting: Cardiology

## 2013-11-03 DIAGNOSIS — Z01810 Encounter for preprocedural cardiovascular examination: Secondary | ICD-10-CM

## 2013-11-03 DIAGNOSIS — J449 Chronic obstructive pulmonary disease, unspecified: Secondary | ICD-10-CM

## 2013-11-03 LAB — CBC WITH DIFFERENTIAL/PLATELET

## 2013-11-03 LAB — CBC
HCT: 37.3 % (ref 36.0–46.0)
HEMATOCRIT: 41.3 % (ref 36.0–46.0)
HEMOGLOBIN: 14.2 g/dL (ref 12.0–15.0)
Hemoglobin: 10.1 g/dL — ABNORMAL LOW (ref 12.0–15.0)
MCH: 31.8 pg (ref 26.0–34.0)
MCH: 32.3 pg (ref 26.0–34.0)
MCHC: 33.2 g/dL (ref 30.0–36.0)
MCHC: 34.4 g/dL (ref 30.0–36.0)
MCV: 95.6 fL (ref 78.0–100.0)
MCV: 94.1 fL (ref 78.0–100.0)
PLATELETS: 208 10*3/uL (ref 150–400)
Platelets: 181 10*3/uL (ref 150–400)
RBC: 3.9 MIL/uL (ref 3.87–5.11)
RBC: 4.39 MIL/uL (ref 3.87–5.11)
RDW: 17.5 % — AB (ref 11.5–15.5)
RDW: 16.6 % — ABNORMAL HIGH (ref 11.5–15.5)
WBC: 12.5 10*3/uL — ABNORMAL HIGH (ref 4.0–10.5)
WBC: 14.1 10*3/uL — ABNORMAL HIGH (ref 4.0–10.5)

## 2013-11-03 LAB — BASIC METABOLIC PANEL
Anion gap: 11 (ref 5–15)
BUN: 10 mg/dL (ref 6–23)
CALCIUM: 9.2 mg/dL (ref 8.4–10.5)
CO2: 26 mEq/L (ref 19–32)
CREATININE: 0.71 mg/dL (ref 0.50–1.10)
Chloride: 102 mEq/L (ref 96–112)
GFR, EST NON AFRICAN AMERICAN: 80 mL/min — AB (ref >90)
Glucose, Bld: 125 mg/dL — ABNORMAL HIGH (ref 70–99)
Potassium: 4 mEq/L (ref 3.7–5.3)
Sodium: 139 mEq/L (ref 137–147)

## 2013-11-03 LAB — GLUCOSE, CAPILLARY
GLUCOSE-CAPILLARY: 132 mg/dL — AB (ref 70–99)
Glucose-Capillary: 140 mg/dL — ABNORMAL HIGH (ref 70–99)
Glucose-Capillary: 62 mg/dL — ABNORMAL LOW (ref 70–99)
Glucose-Capillary: 89 mg/dL (ref 70–99)
Glucose-Capillary: 178 mg/dL — ABNORMAL HIGH (ref 70–99)

## 2013-11-03 MED ORDER — CHLORHEXIDINE GLUCONATE 4 % EX LIQD
60.0000 mL | Freq: Once | CUTANEOUS | Status: AC
  Administered 2013-11-04: 4 via TOPICAL

## 2013-11-03 NOTE — Progress Notes (Signed)
[facility] PROGRESS NOTE  Tina [name] CHY:[id number] DOB: 05/19/1933 DOA: 11/01/2013 PCP: Purvis Kilts, MD  prior rheumatologist [facility]  cardiologist Dr. Einar Gip    80 y/o ?, history of  Scleroderma  1988, chronic GERD +Barrett's esophagus [s/p palpitation 5/13], IBS, right lung surgery for pneumothorax 1988, psoriasis, diet controlled diabetes,  HTN, COPD, possible one-vessel CAD with Lexiscan Cardiolite 1/15 = partially reversible inferior wall deficit.  baseline 4 mets metabolic functionality    Assessment/Plan: Hip fracture, right- from mechanical fall. Defer management to ortho regarding anticoagulation, pain management, weightbearing status. Surgery scheduled 10/16/13-- hemoglobin is 10.  Transfusion instructions as per Dr. Aline Brochure  Prolonged Q-T interval on ECG- Significance unclear. Recent EKG with normal QT interval. Evaluated by cardiology who opine overall low to intermediate risk. Recommend post op EKG.    Hypertension: currently controlled. Home meds include lisinopril and lopressor and HCTZ. Holding HCTZ.  Repeat labs in a.m.  COPD (chronic obstructive pulmonary disease): stable at baseline. Not on home oxygen.   DM (diabetes mellitus): CBG 140.  not on medication due to hx hypoglycemic episodes. Will monitor CBG's and continue SSI for control. HgA1c 5.1  High cholesterol: Lipid panel with HDL 33 otherwise within the limits of normal. Hold statin   Leukocytosis: likely reactive. Trending downward. Urinalysis unremarkable, chest xray without infiltrate. Will monitor.   Anemia: likely related to #1 in setting of chronic disease. Baseline appears to be 12. Anemia panel with iron 24 and TIBC 245 otherwise within the limits of normal. S/p 2 units PRBC's per ortho.   Dementia: appear to be at baseline.    Code Status: full Family Communication: discuss with family at bedside Disposition Plan: likely need snf   Consultants:  Dr Aline Brochure orthopedic  Dr  Domenic Polite cardiology  Procedures:  none  Antibiotics:  none  HPI/Subjective:   confused anxious   no perihilar distress family at bedside Repeatedly  Asks for her daughter  Objective: Filed Vitals:   11/03/13 1115  BP:   Pulse:   Temp:   Resp: 20    Intake/Output Summary (Last 24 hours) at 11/03/13 1123 Last data filed at 11/03/13 0434  Gross per 24 hour  Intake 1297.92 ml  Output   2150 ml  Net -852.08 ml   Filed Weights   11/01/13 2256 11/02/13 0535  Weight: 78.472 kg (173 lb) 70.308 kg (155 lb)    Exam:   General:  Well nourished somewhat pale appears comfortable  Cardiovascular: RRR No MGR No LE edema  Respiratory: normal effort BS clear bilaterally no wheeze no rhonchi  Abdomen: soft +BS nontender to palpation  Musculoskeletal:  Right leg externally rotated. Right foot warm to touch.   Data Reviewed: Basic Metabolic Panel:  Recent Labs Lab 11/02/13 0121 11/03/13 0553  NA 137 139  K 3.8 4.0  CL 100 102  CO2 26 26  GLUCOSE 139* 125*  BUN 10 10  CREATININE 0.77 0.71  CALCIUM 8.7 9.2   Liver Function Tests: No results found for this basename: AST, ALT, ALKPHOS, BILITOT, PROT, ALBUMIN,  in the last 168 hours No results found for this basename: LIPASE, AMYLASE,  in the last 168 hours No results found for this basename: AMMONIA,  in the last 168 hours CBC:  Recent Labs Lab 11/02/13 0121 11/03/13 0553 11/03/13 0730  WBC 87.8* 67.6* DUPLICATE REQUEST  NEUTROABS 13.1*  --  PENDING  HGB 72.0* 94.7* DUPLICATE REQUEST  HCT 09.6* 28.3 DUPLICATE REQUEST  MCV 662.9* 47.6 DUPLICATE  REQUEST  PLT 619 509 DUPLICATE REQUEST   Cardiac Enzymes:  Recent Labs Lab 11/02/13 0121  TROPONINI <0.30   BNP (last 3 results) No results found for this basename: PROBNP,  in the last 8760 hours CBG:  Recent Labs Lab 11/02/13 1129 11/02/13 1602 11/03/13 0712  GLUCAP 112* 135* 140*    No results found for this or any previous visit (from the past  240 hour(s)).   Studies: Dg Chest 1 View  11/02/2013   CLINICAL DATA:  Fall.  EXAM: CHEST - 1 VIEW  COMPARISON:  Chest radiograph May 05, 2013  FINDINGS: The cardiac silhouette appears mildly enlarged, similar. Mediastinal silhouette is nonsuspicious, mildly calcified aortic knob. Diffuse mild interstitial prominence, slightly increasing. Similar nodularity right midlung zone, postoperative change the right lung apex is similar pleural thickening. No pneumothorax. Soft tissue planes and included osseous structures are nonsuspicious.  IMPRESSION: Stable cardiomegaly, increasing interstitial prominence may reflect pulmonary edema. Similar nodularity/calcified pleural plaque and postoperative changes of right midlung zone.   Electronically Signed   By: Elon Alas   On: 11/02/2013 01:22   Dg Shoulder Right  11/02/2013   CLINICAL DATA:  Fall.  EXAM: RIGHT SHOULDER - 2+ VIEW  COMPARISON:  None.  FINDINGS: There is no evidence of fracture or dislocation. Mild acromioclavicular undersurface spurring. There is no evidence of arthropathy or other focal bone abnormality. Soft tissues are unremarkable. Included view of the chest demonstrates postsurgical change and nodularity.  IMPRESSION: No acute fracture deformity or dislocation.   Electronically Signed   By: Elon Alas   On: 11/02/2013 01:21   Dg Hip Complete Right  11/02/2013   CLINICAL DATA:  Right hip pain after a fall.  EXAM: RIGHT HIP - COMPLETE 2+ VIEW  COMPARISON:  None.  FINDINGS: Acute comminuted inter trochanteric fracture of the right hip with varus angulation and impaction. No dislocation of the hip joint. Degenerative changes are present in the lower lumbar spine and both hips. Pelvis, SI joints, and symphysis pubis appear intact.  IMPRESSION: Comminuted intertrochanteric fractures of the right hip.   Electronically Signed   By: Lucienne Capers M.D.   On: 11/02/2013 01:20   Dg Femur Right  11/02/2013   CLINICAL DATA:  Fall.   EXAM: RIGHT FEMUR - 2 VIEW  COMPARISON:  Pelvic radiograph November 01, 2013.  FINDINGS: Comminuted impacted intertrochanteric fracture with varus angulation distal bony fragments. In addition, suspected nondisplaced femoral neck fracture. No dislocation. No destructive bony lesions. Soft tissue planes are nonsuspicious.  IMPRESSION: Comminuted impacted mildly angulated right femur intertrochanteric fracture. In addition, suspected nondisplaced right femoral neck fracture. No dislocation.   Electronically Signed   By: Elon Alas   On: 11/02/2013 01:24   Ct Head Wo Contrast  11/02/2013   CLINICAL DATA:  [place] in the bathroom. Dizziness, weakness, right hip pain.  EXAM: CT HEAD WITHOUT CONTRAST  TECHNIQUE: Contiguous axial images were obtained from the base of the skull through the vertex without intravenous contrast.  COMPARISON:  11/20/2003  FINDINGS: Diffuse cerebral atrophy. No ventricular dilatation. Low-attenuation changes in the deep white matter consistent with small vessel ischemia. Old lacune or infarct in the left thalamus. No mass effect or midline shift. No abnormal extra-axial fluid collections. Gray-white matter junctions are distinct. Basal cisterns are not effaced. No evidence of acute intracranial hemorrhage. No depressed skull fractures. Visualized paranasal sinuses and mastoid air cells are not opacified.  IMPRESSION: No acute intracranial abnormalities. Chronic atrophy and small vessel ischemic changes.  Electronically Signed   By: Lucienne Capers M.D.   On: 11/02/2013 00:44   Ct Hip Right Wo Contrast  11/02/2013   CLINICAL DATA:  Right hip fracture, status post fall. Nonspecific (abnormal) findings on radiological and other examination of musculoskeletal sysem.  EXAM: CT OF THE RIGHT HIP WITHOUT CONTRAST; 3-DIMENSIONAL CT IMAGE RENDERING ON ACQUISITION WORKSTATION  TECHNIQUE: Multidetector CT imaging was performed according to the standard protocol. Multiplanar CT image reconstructions  were also generated.; 3-dimensional CT images were rendered by post-processing of the original CT data on an acquisition workstation. The 3-dimensional CT images were interpreted and findings were reported in the accompanying complete CT report for this study  COMPARISON:  None.  FINDINGS: There is a comminuted fracture of the proximal right femur. There is a comminuted right intertrochanteric fracture with medial displacement of the lesser trochanter. There is mildly displaced femoral neck fracture. There is no dislocation. There is no acetabular fracture. There is a nondisplaced fracture of the right inferior pubic ramus.  There is no lytic or blastic osseous lesion.  There is no soft tissue hematoma, fluid collection or soft tissue mass. The muscles are normal. Mild soft tissue contusion in the subcutaneous fat overlying the greater trochanter.  IMPRESSION: 1. Comminuted fracture of the proximal right femur. Comminuted right intertrochanteric fracture with medial displacement of the lesser trochanter. Mildly displaced femoral neck fracture. No dislocation. 2. Nondisplaced right inferior pubic ramus fracture.   Electronically Signed   By: Kathreen Devoid   On: 11/02/2013 12:12   Ct 3d Recon At Scanner  11/02/2013   CLINICAL DATA:  Right hip fracture, status post fall. Nonspecific (abnormal) findings on radiological and other examination of musculoskeletal sysem.  EXAM: CT OF THE RIGHT HIP WITHOUT CONTRAST; 3-DIMENSIONAL CT IMAGE RENDERING ON ACQUISITION WORKSTATION  TECHNIQUE: Multidetector CT imaging was performed according to the standard protocol. Multiplanar CT image reconstructions were also generated.; 3-dimensional CT images were rendered by post-processing of the original CT data on an acquisition workstation. The 3-dimensional CT images were interpreted and findings were reported in the accompanying complete CT report for this study  COMPARISON:  None.  FINDINGS: There is a comminuted fracture of the  proximal right femur. There is a comminuted right intertrochanteric fracture with medial displacement of the lesser trochanter. There is mildly displaced femoral neck fracture. There is no dislocation. There is no acetabular fracture. There is a nondisplaced fracture of the right inferior pubic ramus.  There is no lytic or blastic osseous lesion.  There is no soft tissue hematoma, fluid collection or soft tissue mass. The muscles are normal. Mild soft tissue contusion in the subcutaneous fat overlying the greater trochanter.  IMPRESSION: 1. Comminuted fracture of the proximal right femur. Comminuted right intertrochanteric fracture with medial displacement of the lesser trochanter. Mildly displaced femoral neck fracture. No dislocation. 2. Nondisplaced right inferior pubic ramus fracture.   Electronically Signed   By: Kathreen Devoid   On: 11/02/2013 12:12    Scheduled Meds: . allopurinol  100 mg Oral BID  . chlorhexidine  60 mL Topical Once  . citalopram  20 mg Oral QHS  . donepezil  10 mg Oral QHS  . insulin aspart  0-9 Units Subcutaneous TID WC  . lisinopril  10 mg Oral q morning - 10a  . metoprolol tartrate  12.5 mg Oral BID  . pantoprazole  40 mg Oral Daily  . simvastatin  40 mg Oral QHS   Continuous Infusions:   Principal Problem:  Hip fracture, right Active Problems:   Hypertension   High cholesterol   Raynaud's disease   COPD (chronic obstructive pulmonary disease)   DM (diabetes mellitus)   Fall at home   Closed right hip fracture   Preoperative cardiovascular examination    Time spent: 35 minutes    [facility] Pager [phone]. If 7PM-7AM, please contact night-coverage at [email or web address], password [facility] 11/03/2013, 11:23 AM  LOS: 2 days       I have seen this patient independently, discussed POC with MID-level provider and patient and ammended note and plan where needed  Verneita Griffes, MD [facility] [phone]

## 2013-11-03 NOTE — Care Management Note (Signed)
    Page 1 of 1   11/06/2013     1:44:52 PM CARE MANAGEMENT NOTE 11/06/2013  Patient:  Tina [name], Tina [name]   Account Number:  [id number]  Date Initiated:  11/03/2013  Documentation initiated by:  Vladimir Creeks  Subjective/Objective Assessment:   Admitted with a Fx hip. Pt is from home, alone, but daughter stays at night and daughters are very attentive. She has agreed to SNF at D/C following surgery.     Action/Plan:   CSW aware of plans for SNF and working on this   Anticipated DC Date:  11/07/2013   Anticipated DC Plan:  [place] referral  Clinical Social Worker      DC Planning Services  CM consult      Choice offered to / List presented to:             Status of service:  In process, will continue to follow Medicare Important Message given?  YES (If response is "NO", the following Medicare IM given date fields will be blank) Date Medicare IM given:  11/04/2013 Medicare IM given by:  Vladimir Creeks Date Additional Medicare IM given:  11/06/2013 Additional Medicare IM given by:  JESSICA CHILDRESS  Discharge Disposition:  [place]  Per UR Regulation:  Reviewed for med. necessity/level of care/duration of stay  If discussed at [place] of Stay Meetings, dates discussed:    Comments:  11/06/2013 [place], RN, MSN, PCCN patient being discharged to SNF. CSW arranging D/C. Pt, pt's family, and pt's RN aware of D/C arrangements. No CM needs noted.  11/03/13 1500 Vladimir Creeks RN/CM

## 2013-11-03 NOTE — Clinical Social Work Psychosocial (Signed)
Clinical Social Work Department BRIEF PSYCHOSOCIAL ASSESSMENT 11/03/2013  Patient:  Tina [name], Tina [name]     Account Number:  [id number]     Admit date:  11/01/2013  Clinical Social Worker:  Wyatt Haste  Date/Time:  11/03/2013 11:27 AM  Referred by:  CSW  Date Referred:  11/03/2013 Referred for  SNF Placement   Other Referral:   Interview type:  Patient Other interview type:   daughter- Tina [name]    PSYCHOSOCIAL DATA Living Status:  ALONE Admitted from facility:   Level of care:   Primary support name:  Tina [name] Primary support relationship to patient:  CHILD, ADULT Degree of support available:   supportive    CURRENT CONCERNS Current Concerns  Post-Acute Placement   Other Concerns:    SOCIAL WORK ASSESSMENT / PLAN CSW met with pt and pt's daughter Tina [name] at bedside. Pt alert and oriented. Reports she lives alone, but she has her daughter Tina [name] stay with her at night because she is afraid to stay by herself. Pt has been alone for the past 4 years after the death of her husband. Tina [name] is an Therapist, sports and lives in [place]. Family calls to check on pt during the day while they are working, but pt generally manages fine. At baseline, she is completely independent. She is able to prepare meals in the microwave and cleans around the house. She relies on family for transportation. Pt fell coming out of the bathroom, fracturing her hip. Tina [name] feels that she slipped out of her bedroom shoes. Awaiting surgery tomorrow. CSW discussed with pt and family that she would likely require SNF at d/c. Pt indicates that this was not something she had considered and seem concerned that she would need to spend the night there. CSW and daughter reassured pt that staff would be there to assist pt if she had any needs. Pt and Tina [name] request  placement as pt has family/friends that would be able to visit her if she remains in town. Aware of Medicare coverage/criteria. CSW will initiate bed search and  follow up with offers when available.   Assessment/plan status:  Psychosocial Support/Ongoing Assessment of Needs Other assessment/ plan:   Information/referral to community resources:   SNF list    PATIENT'S/FAMILY'S RESPONSE TO PLAN OF CARE: Pt and daughter agreeable to SNF for rehab following surgery. Tina [name] reports pt was diagnosed with mild dementia about 9 months ago and started taking Aricept. She indicates pt will need reminders about conversation regarding SNF.       Tina [name], [place]

## 2013-11-03 NOTE — Clinical Social Work Placement (Signed)
Clinical Social Work Department CLINICAL SOCIAL WORK PLACEMENT NOTE 11/03/2013  Patient:  Tina [name], Tina [name]  Account Number:  [id number] Admit date:  11/01/2013  Clinical Social Worker:  [name], LCSW  Date/time:  11/03/2013 11:24 AM  Clinical Social Work is seeking post-discharge placement for this patient at the following level of care:   [place]   (*CSW will update this form in Epic as items are completed)   11/03/2013  Patient/family provided with [place] Department of Clinical Social Work's list of facilities offering this level of care within the geographic area requested by the patient (or if unable, by the patient's family).  11/03/2013  Patient/family informed of their freedom to choose among providers that offer the needed level of care, that participate in Medicare, Medicaid or managed care program needed by the patient, have an available bed and are willing to accept the patient.  11/03/2013  Patient/family informed of [facility]' ownership interest in [facility], as well as of the fact that they are under no obligation to receive care at this facility.  PASARR submitted to EDS on 11/03/2013 PASARR number received on 11/03/2013  FL2 transmitted to all facilities in geographic area requested by pt/family on  11/03/2013 FL2 transmitted to all facilities within larger geographic area on   Patient informed that his/her managed care company has contracts with or will negotiate with  certain facilities, including the following:     Patient/family informed of bed offers received:   Patient chooses bed at  Physician recommends and patient chooses bed at    Patient to be transferred to  on   Patient to be transferred to facility by  Patient and family notified of transfer on  Name of family member notified:    The following physician request were entered in Epic:   Additional Comments:  [name], [place]

## 2013-11-03 NOTE — Consult Note (Signed)
Primary cardiologist: Dr. Kate Sable (seen in consultation 05/05/2013) Consulting cardiologist: Dr. Satira Sark  Clinical Summary Ms. Scalia is a 80 y.o.female admitted to the hospital after a fall while going to the bathroom that occurred at home, no associated syncope. She has sustained a nondisplaced right inferior pubic ramus fracture as well as comminuted right proximal femur fracture, intertrochanteric fracture, and femoral neck fracture. Plan is surgical repair with Dr. Aline Brochure tomorrow. We are consulted by Dr. Aline Brochure for preoperative cardiac evaluation, mainly related to mention of apparent prolonged QT interval on ECG by Dr. Raelene Bott.  I reviewed the patient's history and also spoke with the patient's daughter who is a Marine scientist. She has no known history of obstructive CAD, underwent 2 previous cardiac catheterizations at [facility] several years ago. More recently she was seen in consultation by our cardiology practice back in January. Lexiscan Cardiolite from January of this year demonstrated an inferior wall defect that was partially reversible, related to either variable subdiaphragmatic attenuation versus scar with very mild peri-infarct ischemia, overall low risk, with normal LVEF. No further cardiac testing was recommended at that time.   Patient lives at home, is functional in her ADLs at baseline with activities exceeding 4 METs. No exertional chest pain with this activity. He has no history of palpitations or sudden syncope.  I reviewed the recent ECGs, QT interval is normal, she does have some TU fusion, and a leftward axis.   Allergies  Allergen Reactions  . Penicillins     Medications Scheduled Medications: . allopurinol  100 mg Oral BID  . chlorhexidine  60 mL Topical Once  . citalopram  20 mg Oral QHS  . donepezil  10 mg Oral QHS  . insulin aspart  0-9 Units Subcutaneous TID WC  . lisinopril  10 mg Oral q morning - 10a  . metoprolol tartrate  12.5 mg Oral  BID  . pantoprazole  40 mg Oral Daily  . simvastatin  40 mg Oral QHS     PRN Medications: ALPRAZolam, HYDROcodone-acetaminophen, methocarbamol (ROBAXIN) IV, methocarbamol, morphine injection   Past Medical History  Diagnosis Date  . Lower abdominal pain   . Chronic diarrhea   . Barrett's esophagus   . Irritable bowel syndrome   . Helicobacter pylori (H. pylori)   . Gastritis   . Type 2 diabetes mellitus   . Essential hypertension, benign   . Sleep apnea     Not on CPAP  . 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 Scleroderma   . COPD (chronic obstructive pulmonary disease)   . Raynaud's disease   . Gout   . Stroke     Past Surgical History  Procedure Laterality Date  . Esophagogastroduodenoscopy  06/09  . Colonoscopy  12/2008  . Patella fracture surgery    . Esophagogastroduodenoscopy  02/02/05    EGD ED  . Esophagogastroduodenoscopy  03/08/00    EGD ED  . Cholecystectomy    . Lung surgery  2002    Lung collapsed  . Back surgery      Family History  Problem Relation Age of Onset  . Asthma Father   . Melanoma Brother     Social History Ms. Seybold reports that she has quit smoking. Her smoking use included Cigarettes. She smoked 0.00 packs per day. She does not have any smokeless tobacco history on file. Ms. Gell reports that she does not drink alcohol.  Review of Systems Hard of hearing. Other systems reviewed and negative except as outlined.  Physical Examination Blood pressure 119/54, pulse 77, temperature 98.3 F (36.8 C), temperature source Oral, resp. rate 20, height 5' 7.5" (1.715 m), weight 155 lb (70.308 kg), SpO2 95.00%.  Intake/Output Summary (Last 24 hours) at 11/03/13 0929 Last data filed at 11/03/13 0434  Gross per 24 hour  Intake 1297.92 ml  Output   2150 ml  Net -852.08 ml   Frail-appearing elderly woman, no distress. Complains of hip pain. HEENT: Conjunctiva and lids normal, oropharynx clear. Neck: Supple, no elevated JVP or carotid bruits, no  thyromegaly. Lungs: Clear to auscultation, nonlabored breathing at rest. Cardiac: Regular rate and rhythm, no S3 or significant systolic murmur, no pericardial rub. Abdomen: Soft, nontender, bowel sounds present. Extremities: No pitting edema, distal pulses 1-2+. Skin: Warm and dry. Musculoskeletal: Mild kyphosis. Neuropsychiatric: Alert and oriented x3, affect grossly appropriate.   Lab Results  Basic Metabolic Panel:  Recent Labs Lab 11/02/13 0121 11/03/13 0553  NA 137 139  K 3.8 4.0  CL 100 102  CO2 26 26  GLUCOSE 139* 125*  BUN 10 10  CREATININE 0.77 0.71  CALCIUM 8.7 9.2    CBC:  Recent Labs Lab 11/02/13 0121 11/03/13 0553 11/03/13 0730  WBC 16.1* 09.6* DUPLICATE REQUEST  NEUTROABS 13.1*  --  PENDING  HGB 04.5* 40.9* DUPLICATE REQUEST  HCT 81.1* 91.4 DUPLICATE REQUEST  MCV 782.9* 56.2 DUPLICATE REQUEST  PLT 130 865 DUPLICATE REQUEST    Cardiac Enzymes:  Recent Labs Lab 11/02/13 0121  TROPONINI <0.30    Imaging CHEST - 1 VIEW  COMPARISON: Chest radiograph May 05, 2013  FINDINGS:  The cardiac silhouette appears mildly enlarged, similar. Mediastinal  silhouette is nonsuspicious, mildly calcified aortic knob. Diffuse  mild interstitial prominence, slightly increasing. Similar  nodularity right midlung zone, postoperative change the right lung  apex is similar pleural thickening. No pneumothorax. Soft tissue  planes and included osseous structures are nonsuspicious.  IMPRESSION:  Stable cardiomegaly, increasing interstitial prominence may reflect  pulmonary edema. Similar nodularity/calcified pleural plaque and  postoperative changes of right midlung zone.   Impression  1. Preoperative evaluation in a 80 year old woman with anticipated surgical repair of right hip fracture following recent mechanical fall without syncope. ECG reviewed, QT interval is normal. She does have a history of hypertension and type 2 diabetes mellitus, although had a  low risk Cardiolite done back in January and does not report any reproducible exertional chest pain with activities exceeding 4 METs at baseline.  2. History of COPD, scleroderma, severity unclear. May have some impact on choice of anesthesia.  Recommendations  No further cardiac testing recommended at this time. Should be able to proceed with planned surgery at an acceptable perioperative risk, overall low to intermediate. Would check postoperative ECG. Anesthesia to review pulmonary history in more detail as it relates to choice of anesthesia, may want to consider spinal.  Satira Sark, M.D., F.A.C.C.

## 2013-11-04 ENCOUNTER — Encounter (HOSPITAL_COMMUNITY)
Admission: EM | Disposition: A | Discharge status: Skilled Nursing Facility | Payer: Self-pay | Source: Home / Self Care | Attending: Family Medicine

## 2013-11-04 ENCOUNTER — Encounter (HOSPITAL_COMMUNITY): Payer: Medicare Other | Admitting: Anesthesiology

## 2013-11-04 ENCOUNTER — Inpatient Hospital Stay (HOSPITAL_COMMUNITY): Payer: Medicare Other | Admitting: Anesthesiology

## 2013-11-04 ENCOUNTER — Inpatient Hospital Stay (HOSPITAL_COMMUNITY): Payer: Medicare Other

## 2013-11-04 ENCOUNTER — Encounter (HOSPITAL_COMMUNITY): Payer: Self-pay | Admitting: *Deleted

## 2013-11-04 DIAGNOSIS — S72141A Displaced intertrochanteric fracture of right femur, initial encounter for closed fracture: Secondary | ICD-10-CM

## 2013-11-04 HISTORY — PX: INTRAMEDULLARY (IM) NAIL INTERTROCHANTERIC: SHX5875

## 2013-11-04 LAB — GLUCOSE, CAPILLARY
GLUCOSE-CAPILLARY: 148 mg/dL — AB (ref 70–99)
GLUCOSE-CAPILLARY: 125 mg/dL — AB (ref 70–99)
Glucose-Capillary: 151 mg/dL — ABNORMAL HIGH (ref 70–99)
Glucose-Capillary: 119 mg/dL — ABNORMAL HIGH (ref 70–99)
Glucose-Capillary: 176 mg/dL — ABNORMAL HIGH (ref 70–99)

## 2013-11-04 LAB — BASIC METABOLIC PANEL
ANION GAP: 14 (ref 5–15)
BUN: 11 mg/dL (ref 6–23)
CO2: 24 mEq/L (ref 19–32)
Calcium: 9.3 mg/dL (ref 8.4–10.5)
Chloride: 99 mEq/L (ref 96–112)
Creatinine, Ser: 0.55 mg/dL (ref 0.50–1.10)
GFR calc non Af Amer: 88 mL/min — ABNORMAL LOW (ref >90)
GLUCOSE: 145 mg/dL — AB (ref 70–99)
POTASSIUM: 3.9 meq/L (ref 3.7–5.3)
Sodium: 137 mEq/L (ref 137–147)

## 2013-11-04 LAB — URINE CULTURE: Colony Count: 75000

## 2013-11-04 LAB — CBC WITH DIFFERENTIAL/PLATELET
BASOS PCT: 0 % (ref 0–1)
Basophils Absolute: 0 10*3/uL (ref 0.0–0.1)
Eosinophils Absolute: 0.6 10*3/uL (ref 0.0–0.7)
Eosinophils Relative: 4 % (ref 0–5)
HEMATOCRIT: 41.3 % (ref 36.0–46.0)
Hemoglobin: 14.1 g/dL (ref 12.0–15.0)
LYMPHS PCT: 18 % (ref 12–46)
Lymphs Abs: 2.7 10*3/uL (ref 0.7–4.0)
MCH: 31.9 pg (ref 26.0–34.0)
MCHC: 34.1 g/dL (ref 30.0–36.0)
MCV: 93.4 fL (ref 78.0–100.0)
Monocytes Absolute: 1.2 10*3/uL — ABNORMAL HIGH (ref 0.1–1.0)
Monocytes Relative: 8 % (ref 3–12)
Neutro Abs: 10 10*3/uL — ABNORMAL HIGH (ref 1.7–7.7)
Neutrophils Relative %: 70 % (ref 43–77)
Platelets: 203 10*3/uL (ref 150–400)
RBC: 4.42 MIL/uL (ref 3.87–5.11)
RDW: 16.2 % — ABNORMAL HIGH (ref 11.5–15.5)
WBC: 14.4 10*3/uL — ABNORMAL HIGH (ref 4.0–10.5)

## 2013-11-04 LAB — MRSA PCR SCREENING: MRSA by PCR: NEGATIVE

## 2013-11-04 SURGERY — FIXATION, FRACTURE, INTERTROCHANTERIC, WITH INTRAMEDULLARY ROD
Anesthesia: Spinal | Site: Hip | Laterality: Right

## 2013-11-04 MED ORDER — FENTANYL CITRATE 0.05 MG/ML IJ SOLN
25.0000 ug | INTRAMUSCULAR | Status: DC
  Administered 2013-11-04: 25 ug via INTRAVENOUS

## 2013-11-04 MED ORDER — EPHEDRINE SULFATE 50 MG/ML IJ SOLN
INTRAMUSCULAR | Status: DC | PRN
  Administered 2013-11-04: 10 mg via INTRAVENOUS
  Administered 2013-11-04: 5 mg via INTRAVENOUS

## 2013-11-04 MED ORDER — ONDANSETRON HCL 4 MG PO TABS: 4.0000 mg | ORAL_TABLET | Freq: Four times a day (QID) | ORAL | Status: DC | PRN

## 2013-11-04 MED ORDER — VANCOMYCIN HCL IN DEXTROSE 1-5 GM/200ML-% IV SOLN
INTRAVENOUS | Status: AC
  Filled 2013-11-04: qty 200

## 2013-11-04 MED ORDER — MORPHINE SULFATE 2 MG/ML IJ SOLN
2.0000 mg | INTRAMUSCULAR | Status: DC | PRN
  Administered 2013-11-04 – 2013-11-05 (×4): 2 mg via INTRAVENOUS
  Filled 2013-11-04 (×4): qty 1

## 2013-11-04 MED ORDER — PROPOFOL INFUSION 10 MG/ML OPTIME
INTRAVENOUS | Status: DC | PRN
  Administered 2013-11-04: 50 ug/kg/min via INTRAVENOUS

## 2013-11-04 MED ORDER — EPHEDRINE SULFATE 50 MG/ML IJ SOLN
INTRAMUSCULAR | Status: AC
  Filled 2013-11-04: qty 1

## 2013-11-04 MED ORDER — BISACODYL 5 MG PO TBEC: 5.0000 mg | DELAYED_RELEASE_TABLET | Freq: Every day | ORAL | Status: DC | PRN

## 2013-11-04 MED ORDER — METOPROLOL TARTRATE 1 MG/ML IV SOLN
5.0000 mg | Freq: Once | INTRAVENOUS | Status: AC
  Administered 2013-11-04: 1 mg via INTRAVENOUS

## 2013-11-04 MED ORDER — MIDAZOLAM HCL 2 MG/2ML IJ SOLN
1.0000 mg | INTRAMUSCULAR | Status: DC | PRN
  Administered 2013-11-04: 2 mg via INTRAVENOUS

## 2013-11-04 MED ORDER — ACETAMINOPHEN 650 MG RE SUPP: 650.0000 mg | Freq: Four times a day (QID) | RECTAL | Status: DC | PRN

## 2013-11-04 MED ORDER — SODIUM CHLORIDE 0.9 % IV SOLN
INTRAVENOUS | Status: DC
  Administered 2013-11-04 – 2013-11-05 (×2): via INTRAVENOUS

## 2013-11-04 MED ORDER — BUPIVACAINE-EPINEPHRINE (PF) 0.5% -1:200000 IJ SOLN
INTRAMUSCULAR | Status: AC
  Filled 2013-11-04: qty 60

## 2013-11-04 MED ORDER — OXYCODONE-ACETAMINOPHEN 5-325 MG PO TABS
2.0000 | ORAL_TABLET | ORAL | Status: DC | PRN
  Administered 2013-11-04 – 2013-11-06 (×7): 2 via ORAL
  Filled 2013-11-04 (×7): qty 2

## 2013-11-04 MED ORDER — FENTANYL CITRATE 0.05 MG/ML IJ SOLN
INTRAMUSCULAR | Status: AC
  Filled 2013-11-04: qty 2

## 2013-11-04 MED ORDER — LACTATED RINGERS IV SOLN
INTRAVENOUS | Status: DC | PRN
  Administered 2013-11-04 (×2): via INTRAVENOUS

## 2013-11-04 MED ORDER — FLEET ENEMA 7-19 GM/118ML RE ENEM: 1.0000 | ENEMA | Freq: Once | RECTAL | Status: AC | PRN

## 2013-11-04 MED ORDER — BUPIVACAINE HCL (PF) 0.75 % IJ SOLN
INTRAMUSCULAR | Status: DC | PRN
  Administered 2013-11-04: 15 mg via INTRATHECAL

## 2013-11-04 MED ORDER — SODIUM CHLORIDE 0.9 % IR SOLN
Status: DC | PRN
  Administered 2013-11-04: 1000 mL

## 2013-11-04 MED ORDER — BUPIVACAINE-EPINEPHRINE (PF) 0.5% -1:200000 IJ SOLN
INTRAMUSCULAR | Status: DC | PRN
  Administered 2013-11-04: 60 mL

## 2013-11-04 MED ORDER — BUPIVACAINE IN DEXTROSE 0.75-8.25 % IT SOLN
INTRATHECAL | Status: AC
  Filled 2013-11-04: qty 2

## 2013-11-04 MED ORDER — VANCOMYCIN HCL IN DEXTROSE 1-5 GM/200ML-% IV SOLN
1000.0000 mg | INTRAVENOUS | Status: AC
  Administered 2013-11-04: 1000 mg via INTRAVENOUS

## 2013-11-04 MED ORDER — ENOXAPARIN SODIUM 30 MG/0.3ML ~~LOC~~ SOLN
30.0000 mg | SUBCUTANEOUS | Status: DC
  Administered 2013-11-05: 30 mg via SUBCUTANEOUS
  Filled 2013-11-04 (×2): qty 0.3

## 2013-11-04 MED ORDER — HYDROCODONE-ACETAMINOPHEN 5-325 MG PO TABS: 1.0000 | ORAL_TABLET | Freq: Four times a day (QID) | ORAL | Status: DC | PRN

## 2013-11-04 MED ORDER — DOCUSATE SODIUM 100 MG PO CAPS
100.0000 mg | ORAL_CAPSULE | Freq: Two times a day (BID) | ORAL | Status: DC
  Administered 2013-11-04 – 2013-11-06 (×4): 100 mg via ORAL
  Filled 2013-11-04 (×4): qty 1

## 2013-11-04 MED ORDER — PHENOL 1.4 % MT LIQD: 1.0000 | OROMUCOSAL | Status: DC | PRN

## 2013-11-04 MED ORDER — ALUM & MAG HYDROXIDE-SIMETH 200-200-20 MG/5ML PO SUSP: 30.0000 mL | ORAL | Status: DC | PRN

## 2013-11-04 MED ORDER — SODIUM CHLORIDE 0.9 % IJ SOLN
INTRAMUSCULAR | Status: AC
  Filled 2013-11-04: qty 10

## 2013-11-04 MED ORDER — METOPROLOL TARTRATE 1 MG/ML IV SOLN: 1.0000 mg | Freq: Once | INTRAVENOUS | Status: DC

## 2013-11-04 MED ORDER — LACTATED RINGERS IV SOLN
INTRAVENOUS | Status: DC
  Administered 2013-11-04: 12:00:00 via INTRAVENOUS

## 2013-11-04 MED ORDER — MENTHOL 3 MG MT LOZG: 1.0000 | LOZENGE | OROMUCOSAL | Status: DC | PRN

## 2013-11-04 MED ORDER — FENTANYL CITRATE 0.05 MG/ML IJ SOLN
INTRAMUSCULAR | Status: DC | PRN
  Administered 2013-11-04: 25 ug via INTRAVENOUS
  Administered 2013-11-04: 25 ug via INTRATHECAL

## 2013-11-04 MED ORDER — DEXTROSE 5 % IV SOLN
500.0000 mg | Freq: Four times a day (QID) | INTRAVENOUS | Status: DC | PRN
  Filled 2013-11-04: qty 5

## 2013-11-04 MED ORDER — SENNA 8.6 MG PO TABS
1.0000 | ORAL_TABLET | Freq: Two times a day (BID) | ORAL | Status: DC
  Administered 2013-11-04 – 2013-11-05 (×3): 8.6 mg via ORAL
  Filled 2013-11-04 (×4): qty 1

## 2013-11-04 MED ORDER — VANCOMYCIN HCL IN DEXTROSE 1-5 GM/200ML-% IV SOLN
1000.0000 mg | Freq: Two times a day (BID) | INTRAVENOUS | Status: AC
  Administered 2013-11-04: 1000 mg via INTRAVENOUS
  Filled 2013-11-04: qty 200

## 2013-11-04 MED ORDER — METOCLOPRAMIDE HCL 10 MG PO TABS: 5.0000 mg | ORAL_TABLET | Freq: Three times a day (TID) | ORAL | Status: DC | PRN

## 2013-11-04 MED ORDER — METOPROLOL TARTRATE 1 MG/ML IV SOLN
INTRAVENOUS | Status: AC
  Filled 2013-11-04: qty 5

## 2013-11-04 MED ORDER — ACETAMINOPHEN 325 MG PO TABS: 650.0000 mg | ORAL_TABLET | Freq: Four times a day (QID) | ORAL | Status: DC | PRN

## 2013-11-04 MED ORDER — PROPOFOL 10 MG/ML IV BOLUS
INTRAVENOUS | Status: AC
  Filled 2013-11-04: qty 20

## 2013-11-04 MED ORDER — LIDOCAINE HCL (CARDIAC) 10 MG/ML IV SOLN
INTRAVENOUS | Status: DC | PRN
  Administered 2013-11-04: 25 mg via INTRAVENOUS

## 2013-11-04 MED ORDER — METOCLOPRAMIDE HCL 5 MG/ML IJ SOLN: 5.0000 mg | Freq: Three times a day (TID) | INTRAMUSCULAR | Status: DC | PRN

## 2013-11-04 MED ORDER — SENNOSIDES-DOCUSATE SODIUM 8.6-50 MG PO TABS: 1.0000 | ORAL_TABLET | Freq: Every evening | ORAL | Status: DC | PRN

## 2013-11-04 MED ORDER — ONDANSETRON HCL 4 MG/2ML IJ SOLN: 4.0000 mg | Freq: Once | INTRAMUSCULAR | Status: DC | PRN

## 2013-11-04 MED ORDER — ONDANSETRON HCL 4 MG/2ML IJ SOLN: 4.0000 mg | Freq: Four times a day (QID) | INTRAMUSCULAR | Status: DC | PRN

## 2013-11-04 MED ORDER — METOPROLOL TARTRATE 1 MG/ML IV SOLN
1.0000 mg | INTRAVENOUS | Status: DC | PRN
  Administered 2013-11-04 (×4): 1 mg via INTRAVENOUS

## 2013-11-04 MED ORDER — METHOCARBAMOL 500 MG PO TABS
500.0000 mg | ORAL_TABLET | Freq: Four times a day (QID) | ORAL | Status: DC | PRN
  Administered 2013-11-04 – 2013-11-06 (×4): 500 mg via ORAL
  Filled 2013-11-04 (×4): qty 1

## 2013-11-04 MED ORDER — MIDAZOLAM HCL 2 MG/2ML IJ SOLN
INTRAMUSCULAR | Status: AC
  Filled 2013-11-04: qty 2

## 2013-11-04 MED ORDER — FENTANYL CITRATE 0.05 MG/ML IJ SOLN: 25.0000 ug | INTRAMUSCULAR | Status: DC | PRN

## 2013-11-04 MED ORDER — PHENYLEPHRINE HCL 10 MG/ML IJ SOLN
INTRAMUSCULAR | Status: DC | PRN
  Administered 2013-11-04 (×4): 100 ug via INTRAVENOUS

## 2013-11-04 SURGICAL SUPPLY — 55 items
BAG HAMPER (MISCELLANEOUS) ×3 IMPLANT
BIT DRILL AO GAMMA 4.2X300 (BIT) ×3 IMPLANT
BLADE 10 SAFETY STRL DISP (BLADE) ×2 IMPLANT
BNDG GAUZE ELAST 4 BULKY (GAUZE/BANDAGES/DRESSINGS) ×3 IMPLANT
CHLORAPREP W/TINT 26ML (MISCELLANEOUS) ×3 IMPLANT
CLOTH BEACON ORANGE TIMEOUT ST (SAFETY) ×3 IMPLANT
COVER LIGHT HANDLE STERIS (MISCELLANEOUS) ×6 IMPLANT
COVER MAYO STAND XLG (DRAPE) ×3 IMPLANT
DECANTER SPIKE VIAL GLASS SM (MISCELLANEOUS) ×3 IMPLANT
DRAPE STERI IOBAN 125X83 (DRAPES) ×3 IMPLANT
DRSG MEPILEX BORDER 4X12 (GAUZE/BANDAGES/DRESSINGS) ×3 IMPLANT
ELECT REM PT RETURN 9FT ADLT (ELECTROSURGICAL) ×3
ELECTRODE REM PT RTRN 9FT ADLT (ELECTROSURGICAL) ×1 IMPLANT
GLOVE BIO SURGEON STRL SZ7 (GLOVE) ×2 IMPLANT
GLOVE BIOGEL PI IND STRL 7.0 (GLOVE) IMPLANT
GLOVE BIOGEL PI IND STRL 7.5 (GLOVE) IMPLANT
GLOVE BIOGEL PI INDICATOR 7.0 (GLOVE) ×2
GLOVE BIOGEL PI INDICATOR 7.5 (GLOVE) ×2
GLOVE EXAM NITRILE MD LF STRL (GLOVE) ×2 IMPLANT
GLOVE SKINSENSE NS SZ8.0 LF (GLOVE) ×2
GLOVE SKINSENSE STRL SZ8.0 LF (GLOVE) ×1 IMPLANT
GLOVE SS N UNI LF 7.5 STRL (GLOVE) ×2 IMPLANT
GLOVE SS N UNI LF 8.5 STRL (GLOVE) ×3 IMPLANT
GOWN STRL REUS W/TWL LRG LVL3 (GOWN DISPOSABLE) ×9 IMPLANT
GOWN STRL REUS W/TWL XL LVL3 (GOWN DISPOSABLE) ×3 IMPLANT
GUIDEROD T2 3X1000 (ROD) ×3 IMPLANT
INST SET MAJOR BONE (KITS) ×3 IMPLANT
K-WIRE  3.2X450M STR (WIRE) ×2
K-WIRE 3.2X450M STR (WIRE) ×1
KIT BLADEGUARD II DBL (SET/KITS/TRAYS/PACK) ×3 IMPLANT
KIT ROOM TURNOVER AP CYSTO (KITS) ×3 IMPLANT
KWIRE 3.2X450M STR (WIRE) ×1 IMPLANT
MANIFOLD NEPTUNE II (INSTRUMENTS) ×3 IMPLANT
MARKER SKIN DUAL TIP RULER LAB (MISCELLANEOUS) ×3 IMPLANT
NAIL TROCH GAMMA 11X18 (Nail) ×2 IMPLANT
NDL HYPO 21X1.5 SAFETY (NEEDLE) ×1 IMPLANT
NDL SPNL 18GX3.5 QUINCKE PK (NEEDLE) ×1 IMPLANT
NEEDLE HYPO 21X1.5 SAFETY (NEEDLE) ×3 IMPLANT
NEEDLE SPNL 18GX3.5 QUINCKE PK (NEEDLE) ×3 IMPLANT
NS IRRIG 1000ML POUR BTL (IV SOLUTION) ×3 IMPLANT
PACK BASIC III (CUSTOM PROCEDURE TRAY) ×3
PACK SRG BSC III STRL LF ECLPS (CUSTOM PROCEDURE TRAY) ×1 IMPLANT
PENCIL HANDSWITCHING (ELECTRODE) ×3 IMPLANT
SCREW LAG GAMMA 3 TI 10.5X85MM (Screw) ×2 IMPLANT
SCREW LOCKING T2 F/T  5MMX35MM (Screw) ×2 IMPLANT
SCREW LOCKING T2 F/T 5MMX35MM (Screw) IMPLANT
SET BASIN LINEN APH (SET/KITS/TRAYS/PACK) ×3 IMPLANT
SPONGE LAP 18X18 X RAY DECT (DISPOSABLE) ×6 IMPLANT
STAPLER VISISTAT 35W (STAPLE) ×2 IMPLANT
SUT MNCRL 0 VIOLET CTX 36 (SUTURE) ×1 IMPLANT
SUT MON AB 2-0 CT1 36 (SUTURE) ×3 IMPLANT
SUT MONOCRYL 0 CTX 36 (SUTURE) ×2
SYR 30ML LL (SYRINGE) ×3 IMPLANT
SYR BULB IRRIGATION 50ML (SYRINGE) ×6 IMPLANT
YANKAUER SUCT 12FT TUBE ARGYLE (SUCTIONS) ×3 IMPLANT

## 2013-11-04 NOTE — OR Nursing (Signed)
Lab called and confirmed that 1 unit is available, and will set up another unit for OR

## 2013-11-04 NOTE — Progress Notes (Signed)
bair hugger applied.

## 2013-11-04 NOTE — Anesthesia Preprocedure Evaluation (Signed)
Anesthesia Evaluation  Patient identified by MRN, date of birth, ID band Patient awake    Reviewed: Allergy & Precautions, H&P , NPO status , Patient's Chart, lab work & pertinent test results, reviewed documented beta blocker date and time   History of Anesthesia Complications (+) DIFFICULT AIRWAY and history of anesthetic complications  Airway Mallampati: IV TM Distance: <3 FB Neck ROM: Full  Mouth opening: Limited Mouth Opening  Dental  (+) Edentulous Upper, Edentulous Lower   Pulmonary sleep apnea , COPDformer smoker,  breath sounds clear to auscultation        Cardiovascular hypertension, Pt. on medications and Pt. on home beta blockers + Peripheral Vascular Disease Rhythm:Regular Rate:Normal     Neuro/Psych CVA    GI/Hepatic Scleroderma, barretts esophagus   Endo/Other  diabetes, Type 2, Oral Hypoglycemic Agents  Renal/GU      Musculoskeletal   Abdominal   Peds  Hematology   Anesthesia Other Findings   Reproductive/Obstetrics                           Anesthesia Physical Anesthesia Plan  ASA: III  Anesthesia Plan: Spinal   Post-op Pain Management:    Induction:   Airway Management Planned: Simple Face Mask  Additional Equipment:   Intra-op Plan:   Post-operative Plan:   Informed Consent: I have reviewed the patients History and Physical, chart, labs and discussed the procedure including the risks, benefits and alternatives for the proposed anesthesia with the patient or authorized representative who has indicated his/her understanding and acceptance.     Plan Discussed with:   Anesthesia Plan Comments:         Anesthesia Quick Evaluation

## 2013-11-04 NOTE — Brief Op Note (Signed)
11/01/2013 - 11/04/2013  2:21 PM  PATIENT:  Tina [name]  80 y.o. female  PRE-OPERATIVE DIAGNOSIS:  peritrochanteric right hip fracture  POST-OPERATIVE DIAGNOSIS:  peritrochanteric right hip fracture  PROCEDURE:  Procedure(s): INTERNAL FIXATION RIGHT HIP WITH GAMMA NAIL (Right)  125 11 x 180 mm short gamma nail with an 85 x 10.5 mm lag screw we used a 35 x 5 mm distal locking bolt and a proximal acorn in sliding mode  Details of procedure Step one the patient was identified in the preop area as Tina [name]. Chart update was completed. Right hip was confirmed and marked as a surgical site. The patient was taken to the operating room vancomycin was started secondary to penicillin allergy. Spinal anesthetic was administered without complication. The patient was placed on the fracture table with appropriate padding.  Step two the C-arm was brought in and traction and internal rotation were used to obtain a stable reduction on x-ray.  Step #3 sterile prep and drape and timeout were completed. Skin incision was made over the trochanter extended proximally. Subcutaneous tissue was divided. Fascia was split in line with the skin incision. The greater trochanter was identified on the superior and inferior aspects. A curved awl was placed in the anterior third of the trochanter and radiographs confirmed its position. Guidewire was passed through the curved awl down to the knee. The reamer was passed over the guidewire.  Step #4 the nail was passed over the guidewire. A stab wound was placed laterally sharp dissection was carried down to bone. The cannula was advanced down to bone. AP and lateral x-rays were used to confirm pin placement in the Center the femoral head on the lateral view and in the inferior - Center position on the AP view. The guidepin was measured at 85 mm. The triple reamer set for a 5 mm and passed over the guidewire. The lag screw was placed over the guidewire and confirmed to be  in excellent position on AP and lateral x-ray. We then released the traction and applied compression mode to the lag screw  Under C-arm guidance. After irrigation of the proximal femur and trochanter the acorn was placed in a sliding mode.  Step #5 We next turned our attention to the distal locking screw. We set the guide to the dynamic mode. We made a stab wound over the lateral femur past the cannula down to bone and then drilled across the cannula into the femur and then passed a 35 mm x 5 mm locking bolt. Radiographs confirmed position of all hardware and excellent reduction.  Step #6 after thorough irrigation the fascial layer proximally was closed with 0 Monocryl followed by 0 Monocryl for the subcutaneous layer and the distal screw stab wounds were closed with 2-0 Monocryl. We injected a total of 60 cc of Marcaine with epinephrine 30 cc proximally and 30 cc divided distally between each lateral incision. We then applied sterile dressings and the patient was taken to recovery room in stable condition  Postop plan weightbearing as tolerated. Lovenox for DVT prevention can be started tomorrow. His compression hose and compression devices tonight start.    SURGEON:  Surgeon(s) and Role:    * Carole Civil, MD - Primary  PHYSICIAN ASSISTANT:   ASSISTANTS: betty ashley   ANESTHESIA:   spinal  EBL:  Total I/O In: 1100 [I.V.:1100] Out: 550 [Urine:450; Blood:100]  BLOOD ADMINISTERED:none  DRAINS: none   LOCAL MEDICATIONS USED:  MARCAINE     SPECIMEN:  No Specimen  DISPOSITION OF SPECIMEN:  N/A  COUNTS:  YES  TOURNIQUET:  * No tourniquets in log *  DICTATION: .Dragon Dictation  PLAN OF CARE: Admit to inpatient   PATIENT DISPOSITION:  PACU - hemodynamically stable.   Delay start of Pharmacological VTE agent (>24hrs) due to surgical blood loss or risk of bleeding: yes

## 2013-11-04 NOTE — Progress Notes (Signed)
From OR. Awake. Shivering. Difficulty obtaining BP and O2 sat readings. Warm blankets applied. Pulse OX applied to bridge on nose.

## 2013-11-04 NOTE — Transfer of Care (Signed)
Immediate Anesthesia Transfer of Care Note  Patient: Tina [name]  Procedure(s) Performed: Procedure(s): INTERNAL FIXATION RIGHT HIP WITH GAMMA NAIL (Right)  Patient Location: PACU  Anesthesia Type:Spinal  Level of Consciousness: awake, alert , oriented and patient cooperative  Airway & Oxygen Therapy: Patient Spontanous Breathing and Patient connected to nasal cannula oxygen  Post-op Assessment: Report given to PACU RN and Post -op Vital signs reviewed and stable  Post vital signs: Reviewed and stable  Complications: No apparent anesthesia complications

## 2013-11-04 NOTE — Anesthesia Procedure Notes (Addendum)
Date/Time: 11/04/2013 12:14 PM Performed by: Andree Elk, Jaecob Lowden A Pre-anesthesia Checklist: Patient identified, Timeout performed, Emergency Drugs available, Suction available and Patient being monitored Patient Re-evaluated:Patient Re-evaluated prior to inductionOxygen Delivery Method: Simple face mask   Spinal  Patient location during procedure: OR Start time: 11/04/2013 12:35 PM Staffing CRNA/Resident: Seraphim Affinito A Preanesthetic Checklist Completed: patient identified, site marked, surgical consent, pre-op evaluation, timeout performed, IV checked, risks and benefits discussed and monitors and equipment checked Spinal Block Patient position: right lateral decubitus Prep: Betadine Patient monitoring: heart rate, cardiac monitor, continuous pulse ox and blood pressure Approach: right paramedian Location: L3-4 Injection technique: single-shot Needle Needle type: Spinocan  Needle gauge: 22 G Needle length: 9 cm Assessment Sensory level: T8 Additional Notes ATTEMPTS:1 TRAY [id number] TRAY EXPIRATION DATE:09/2014

## 2013-11-04 NOTE — OR Nursing (Signed)
OR CRNA and OR nurse  Aware of patient scleraderma and issue with small mouth. Had a hard time removing dentures.

## 2013-11-04 NOTE — Anesthesia Postprocedure Evaluation (Signed)
  Anesthesia Post-op Note  Patient: Tina [name]  Procedure(s) Performed: Procedure(s): INTERNAL FIXATION RIGHT HIP WITH GAMMA NAIL (Right)  Patient Location: PACU  Anesthesia Type:Spinal  Level of Consciousness: awake, alert , oriented and patient cooperative  Airway and Oxygen Therapy: Patient Spontanous Breathing and Patient connected to nasal cannula oxygen  Post-op Pain: none  Post-op Assessment: Post-op Vital signs reviewed, Patient's Cardiovascular Status Stable, Respiratory Function Stable, Patent Airway, No signs of Nausea or vomiting and Pain level controlled  Post-op Vital Signs: Reviewed and stable  Last Vitals:  Filed Vitals:   11/04/13 1215  BP: 158/86  Pulse: 86  Temp:   Resp: 20    Complications: No apparent anesthesia complications

## 2013-11-04 NOTE — Progress Notes (Signed)
[facility] PROGRESS NOTE  Tina [name] GXQ:[id number] DOB: [birth year] DOA: 11/01/2013 PCP: Purvis Kilts, MD  80 y/o ?, history of Scleroderma 1988, chronic GERD +Barrett's esophagus [s/p palpitation 5/13], IBS, right lung surgery for pneumothorax 1988, psoriasis, diet controlled diabetes, HTN, COPD, possible one-vessel CAD with Lexiscan Cardiolite 1/15 = partially reversible inferior wall deficit.  baseline 4 mets metabolic functionality Underwent INTERNAL FIXATION RIGHT HIP WITH GAMMA NAIL (Right) 11/04/13 Dr. Aline Brochure   Assessment/Plan:  Hip fracture, right- from mechanical fall. Defer management to ortho regarding anticoagulation, pain management, weightbearing status. Surgery scheduled today-- hemoglobin is 14. Rpt in am   Prolonged Q-T interval on ECG- Significance unclear. Recent EKG with normal QT interval. Evaluated by cardiology who opine overall low to intermediate risk. Recommend post op EKG.   Hypertension: Fair control. Home meds include lisinopril and lopressor and HCTZ. Holding HCTZ for now.  Repeat labs in a.m.   COPD: (chronic obstructive pulmonary disease): stable at baseline. Not on home oxygen.   DM (diabetes mellitus):HgA1c 5.1  CBG range 151-178. not on medication due to hx hypoglycemic episodes. QIDAC CBG's + SSI for control.   High cholesterol: Lipid panel with HDL 33 otherwise within the limits of normal. Hold statin   Leukocytosis: likely reactive. Stable. Urinalysis unremarkable, chest xray without infiltrate. Will monitor.   Anemia: likely related to #1 in setting of chronic disease. Baseline appears to be 12. Anemia panel with iron 24 and TIBC 245 otherwise within the limits of normal. S/p 3 units PRBC's per ortho. Hg 14 today  Dementia: appear to be at baseline.    Code Status: full Family Communication: daughter at bedside Disposition Plan: likely snf in 48 hours or  so   Consultants:  Orthopedics  cardiology  Procedures:  none  Antibiotics:  none  HPI/Subjective: Reports pain control fair and states "looking forward to getting this over with"  Objective: Filed Vitals:   11/04/13 0800  BP:   Pulse:   Temp:   Resp: 20    Intake/Output Summary (Last 24 hours) at 11/04/13 0915 Last data filed at 11/04/13 0914  Gross per 24 hour  Intake  312.5 ml  Output   1750 ml  Net -1437.5 ml   Filed Weights   11/01/13 2256 11/02/13 0535  Weight: 78.472 kg (173 lb) 70.308 kg (155 lb)    Exam:   General:  Thin frail slightly pale and drowsy  Cardiovascular: RRR No MGR No LE edema  Respiratory: normal effort somewhat shallow no wheeze   Abdomen: non-distended non-tender to palpation +BS   Musculoskeletal: no clubbing or cyanosis, right leg externally rotated. Right foot warm to touch. Decreased rom to right hip due to pain  Data Reviewed: Basic Metabolic Panel:  Recent Labs Lab 11/02/13 0121 11/03/13 0553 11/04/13 0623  NA 137 139 137  K 3.8 4.0 3.9  CL 100 102 99  CO2 26 26 24   GLUCOSE 139* 125* 145*  BUN 10 10 11   CREATININE 0.77 0.71 0.55  CALCIUM 8.7 9.2 9.3   Liver Function Tests: No results found for this basename: AST, ALT, ALKPHOS, BILITOT, PROT, ALBUMIN,  in the last 168 hours No results found for this basename: LIPASE, AMYLASE,  in the last 168 hours No results found for this basename: AMMONIA,  in the last 168 hours CBC:  Recent Labs Lab 11/02/13 0121 11/03/13 0553 11/03/13 0730 11/03/13 1904 11/04/13 0623  WBC 14.4* 81.8* DUPLICATE REQUEST 56.3* 14.4*  NEUTROABS 13.1*  --  PENDING  --  10.0*  HGB 67.8* 93.8* DUPLICATE REQUEST 10.1 75.1  HCT 02.5* 85.2 DUPLICATE REQUEST 77.8 24.2  MCV 353.6* 14.4 DUPLICATE REQUEST 31.5 40.0  PLT 867 619 DUPLICATE REQUEST 509 326   Cardiac Enzymes:  Recent Labs Lab 11/02/13 0121  TROPONINI <0.30   BNP (last 3 results) No results found for this basename:  PROBNP,  in the last 8760 hours CBG:  Recent Labs Lab 11/03/13 1128 11/03/13 1643 11/03/13 1706 11/03/13 2107 11/04/13 0743  GLUCAP 132* 62* 89 178* 151*    Recent Results (from the past 240 hour(s))  URINE CULTURE     Status: None   Collection Time    11/02/13  2:28 AM      Result Value Ref Range Status   Specimen Description URINE, CATHETERIZED   Final   Special Requests NONE   Final   Culture  Setup Time     Final   Value: 11/02/2013 13:44     Performed at [facility]     Final   Value: 75,000 COLONIES/ML     Performed at [facility]   Culture     Final   Value: [place]     DIPHTHEROIDS(CORYNEBACTERIUM SPECIES)     Note: Standardized susceptibility testing for this organism is not available.     Performed at [facility]   Report Status PENDING   Incomplete  MRSA PCR SCREENING     Status: None   Collection Time    11/04/13 12:53 AM      Result Value Ref Range Status   MRSA by PCR NEGATIVE  NEGATIVE Final   Comment:            The GeneXpert MRSA Assay (FDA     approved for NASAL specimens     only), is one component of a     comprehensive MRSA colonization     surveillance program. It is not     intended to diagnose MRSA     infection nor to guide or     monitor treatment for     MRSA infections.     Studies: Ct Hip Right Wo Contrast  11/02/2013   CLINICAL DATA:  Right hip fracture, status post fall. Nonspecific (abnormal) findings on radiological and other examination of musculoskeletal sysem.  EXAM: CT OF THE RIGHT HIP WITHOUT CONTRAST; 3-DIMENSIONAL CT IMAGE RENDERING ON ACQUISITION WORKSTATION  TECHNIQUE: Multidetector CT imaging was performed according to the standard protocol. Multiplanar CT image reconstructions were also generated.; 3-dimensional CT images were rendered by post-processing of the original CT data on an acquisition workstation. The 3-dimensional CT images were interpreted and findings were  reported in the accompanying complete CT report for this study  COMPARISON:  None.  FINDINGS: There is a comminuted fracture of the proximal right femur. There is a comminuted right intertrochanteric fracture with medial displacement of the lesser trochanter. There is mildly displaced femoral neck fracture. There is no dislocation. There is no acetabular fracture. There is a nondisplaced fracture of the right inferior pubic ramus.  There is no lytic or blastic osseous lesion.  There is no soft tissue hematoma, fluid collection or soft tissue mass. The muscles are normal. Mild soft tissue contusion in the subcutaneous fat overlying the greater trochanter.  IMPRESSION: 1. Comminuted fracture of the proximal right femur. Comminuted right intertrochanteric fracture with medial displacement of the lesser trochanter. Mildly displaced femoral neck fracture. No dislocation. 2. Nondisplaced right inferior pubic ramus  fracture.   Electronically Signed   By: Kathreen Devoid   On: 11/02/2013 12:12   Ct 3d Recon At Scanner  11/02/2013   CLINICAL DATA:  Right hip fracture, status post fall. Nonspecific (abnormal) findings on radiological and other examination of musculoskeletal sysem.  EXAM: CT OF THE RIGHT HIP WITHOUT CONTRAST; 3-DIMENSIONAL CT IMAGE RENDERING ON ACQUISITION WORKSTATION  TECHNIQUE: Multidetector CT imaging was performed according to the standard protocol. Multiplanar CT image reconstructions were also generated.; 3-dimensional CT images were rendered by post-processing of the original CT data on an acquisition workstation. The 3-dimensional CT images were interpreted and findings were reported in the accompanying complete CT report for this study  COMPARISON:  None.  FINDINGS: There is a comminuted fracture of the proximal right femur. There is a comminuted right intertrochanteric fracture with medial displacement of the lesser trochanter. There is mildly displaced femoral neck fracture. There is no dislocation.  There is no acetabular fracture. There is a nondisplaced fracture of the right inferior pubic ramus.  There is no lytic or blastic osseous lesion.  There is no soft tissue hematoma, fluid collection or soft tissue mass. The muscles are normal. Mild soft tissue contusion in the subcutaneous fat overlying the greater trochanter.  IMPRESSION: 1. Comminuted fracture of the proximal right femur. Comminuted right intertrochanteric fracture with medial displacement of the lesser trochanter. Mildly displaced femoral neck fracture. No dislocation. 2. Nondisplaced right inferior pubic ramus fracture.   Electronically Signed   By: Kathreen Devoid   On: 11/02/2013 12:12    Scheduled Meds: . allopurinol  100 mg Oral BID  . chlorhexidine  60 mL Topical Once  . citalopram  20 mg Oral QHS  . donepezil  10 mg Oral QHS  . insulin aspart  0-9 Units Subcutaneous TID WC  . lisinopril  10 mg Oral q morning - 10a  . metoprolol tartrate  12.5 mg Oral BID  . pantoprazole  40 mg Oral Daily  . simvastatin  40 mg Oral QHS   Continuous Infusions:   Principal Problem:   Hip fracture, right Active Problems:   Hypertension   High cholesterol   Raynaud's disease   COPD (chronic obstructive pulmonary disease)   DM (diabetes mellitus)   Fall at home   Closed right hip fracture   Preoperative cardiovascular examination    Time spent: 35 minutes    [facility] Pager [phone]. If 7PM-7AM, please contact night-coverage at [email or web address], password [facility] 11/04/2013, 9:15 AM  LOS: 3 days     I have seen this patient independently, discussed POC with MID-level provider and patient and ammended note and plan  where needed  Seen immediately after surgery and then i hour subsequent. Daughter reportesincreasing pain but moving hip without regard for recent surgery Aware rehab may be challenging as dementia might preclude her understanding Increased Norco to percocet Reassess pain needs am  Verneita Griffes, MD [facility] [phone]

## 2013-11-04 NOTE — Progress Notes (Signed)
Bair hugger d/c'd. Resting quietly.

## 2013-11-04 NOTE — Op Note (Signed)
11/01/2013 - 11/04/2013  2:21 PM  PATIENT:  Tina [name]  80 y.o. female  PRE-OPERATIVE DIAGNOSIS:  peritrochanteric right hip fracture  POST-OPERATIVE DIAGNOSIS:  peritrochanteric right hip fracture  PROCEDURE:  Procedure(s): INTERNAL FIXATION RIGHT HIP WITH GAMMA NAIL (Right)  125 11 x 180 mm short gamma nail with an 85 x 10.5 mm lag screw we used a 35 x 5 mm distal locking bolt and a proximal acorn in sliding mode  Details of procedure Step one the patient was identified in the preop area as [name]. Chart update was completed. Right hip was confirmed and marked as a surgical site. The patient was taken to the operating room vancomycin was started secondary to penicillin allergy. Spinal anesthetic was administered without complication. The patient was placed on the fracture table with appropriate padding.  Step two the C-arm was brought in and traction and internal rotation were used to obtain a stable reduction on x-ray.  Step #3 sterile prep and drape and timeout were completed. Skin incision was made over the trochanter extended proximally. Subcutaneous tissue was divided. Fascia was split in line with the skin incision. The greater trochanter was identified on the superior and inferior aspects. A curved awl was placed in the anterior third of the trochanter and radiographs confirmed its position. Guidewire was passed through the curved awl down to the knee. The reamer was passed over the guidewire.  Step #4 the nail was passed over the guidewire. A stab wound was placed laterally sharp dissection was carried down to bone. The cannula was advanced down to bone. AP and lateral x-rays were used to confirm pin placement in the Center the femoral head on the lateral view and in the inferior - Center position on the AP view. The guidepin was measured at 85 mm. The triple reamer set for a 5 mm and passed over the guidewire. The lag screw was placed over the guidewire and confirmed to be  in excellent position on AP and lateral x-ray. We then released the traction and applied compression mode to the lag screw  Under C-arm guidance. After irrigation of the proximal femur and trochanter the acorn was placed in a sliding mode.  Step #5 We next turned our attention to the distal locking screw. We set the guide to the dynamic mode. We made a stab wound over the lateral femur past the cannula down to bone and then drilled across the cannula into the femur and then passed a 35 mm x 5 mm locking bolt. Radiographs confirmed position of all hardware and excellent reduction.  Step #6 after thorough irrigation the fascial layer proximally was closed with 0 Monocryl followed by 0 Monocryl for the subcutaneous layer and the distal screw stab wounds were closed with 2-0 Monocryl. We injected a total of 60 cc of Marcaine with epinephrine 30 cc proximally and 30 cc divided distally between each lateral incision. We then applied sterile dressings and the patient was taken to recovery room in stable condition  Postop plan weightbearing as tolerated. Lovenox for DVT prevention can be started tomorrow. His compression hose and compression devices tonight start.    SURGEON:  Surgeon(s) and Role:    * Carole Civil, MD - Primary  PHYSICIAN ASSISTANT:   ASSISTANTS: betty ashley   ANESTHESIA:   spinal  EBL:  Total I/O In: 1100 [I.V.:1100] Out: 550 [Urine:450; Blood:100]  BLOOD ADMINISTERED:none  DRAINS: none   LOCAL MEDICATIONS USED:  MARCAINE     SPECIMEN:  No Specimen  DISPOSITION OF SPECIMEN:  N/A  COUNTS:  YES  TOURNIQUET:  * No tourniquets in log *  DICTATION: .Dragon Dictation  PLAN OF CARE: Admit to inpatient   PATIENT DISPOSITION:  PACU - hemodynamically stable.   Delay start of Pharmacological VTE agent (>24hrs) due to surgical blood loss or risk of bleeding: yes

## 2013-11-05 LAB — GLUCOSE, CAPILLARY
Glucose-Capillary: 142 mg/dL — ABNORMAL HIGH (ref 70–99)
Glucose-Capillary: 160 mg/dL — ABNORMAL HIGH (ref 70–99)
Glucose-Capillary: 115 mg/dL — ABNORMAL HIGH (ref 70–99)
Glucose-Capillary: 147 mg/dL — ABNORMAL HIGH (ref 70–99)

## 2013-11-05 LAB — CBC WITH DIFFERENTIAL/PLATELET
BASOS ABS: 0 10*3/uL (ref 0.0–0.1)
Basophils Relative: 0 % (ref 0–1)
EOS ABS: 0.2 10*3/uL (ref 0.0–0.7)
Eosinophils Relative: 1 % (ref 0–5)
HCT: 36.9 % (ref 36.0–46.0)
Hemoglobin: 12.4 g/dL (ref 12.0–15.0)
LYMPHS ABS: 2.9 10*3/uL (ref 0.7–4.0)
Lymphocytes Relative: 19 % (ref 12–46)
MCH: 32 pg (ref 26.0–34.0)
MCHC: 33.6 g/dL (ref 30.0–36.0)
MCV: 95.3 fL (ref 78.0–100.0)
Monocytes Absolute: 1.5 10*3/uL — ABNORMAL HIGH (ref 0.1–1.0)
Monocytes Relative: 10 % (ref 3–12)
NEUTROS PCT: 70 % (ref 43–77)
Neutro Abs: 10.6 10*3/uL — ABNORMAL HIGH (ref 1.7–7.7)
PLATELETS: 224 10*3/uL (ref 150–400)
RBC: 3.87 MIL/uL (ref 3.87–5.11)
RDW: 15.9 % — AB (ref 11.5–15.5)
WBC: 15.2 10*3/uL — ABNORMAL HIGH (ref 4.0–10.5)

## 2013-11-05 LAB — COMPREHENSIVE METABOLIC PANEL
ALK PHOS: 68 U/L (ref 39–117)
ALT: 14 U/L (ref 0–35)
ANION GAP: 11 (ref 5–15)
AST: 28 U/L (ref 0–37)
Albumin: 2.7 g/dL — ABNORMAL LOW (ref 3.5–5.2)
BUN: 12 mg/dL (ref 6–23)
CO2: 27 mEq/L (ref 19–32)
CREATININE: 0.58 mg/dL (ref 0.50–1.10)
Calcium: 9.3 mg/dL (ref 8.4–10.5)
Chloride: 98 mEq/L (ref 96–112)
GFR calc non Af Amer: 86 mL/min — ABNORMAL LOW (ref >90)
GLUCOSE: 153 mg/dL — AB (ref 70–99)
Potassium: 4.2 mEq/L (ref 3.7–5.3)
Sodium: 136 mEq/L — ABNORMAL LOW (ref 137–147)
TOTAL PROTEIN: 6.4 g/dL (ref 6.0–8.3)
Total Bilirubin: 0.9 mg/dL (ref 0.3–1.2)

## 2013-11-05 LAB — PROTIME-INR
INR: 1.17 (ref 0.00–1.49)
PROTHROMBIN TIME: 14.9 s (ref 11.6–15.2)

## 2013-11-05 NOTE — Clinical Social Work Placement (Signed)
Clinical Social Work Department CLINICAL SOCIAL WORK PLACEMENT NOTE 11/05/2013  Patient:  Tina [name], Tina [name]  Account Number:  [id number] Admit date:  11/01/2013  Clinical Social Worker:  [name], LCSW  Date/time:  11/03/2013 11:24 AM  Clinical Social Work is seeking post-discharge placement for this patient at the following level of care:   [place]   (*CSW will update this form in Epic as items are completed)   11/03/2013  Patient/family provided with [facility] Department of Clinical Social Work's list of facilities offering this level of care within the geographic area requested by the patient (or if unable, by the patient's family).  11/03/2013  Patient/family informed of their freedom to choose among providers that offer the needed level of care, that participate in Medicare, Medicaid or managed care program needed by the patient, have an available bed and are willing to accept the patient.  11/03/2013  Patient/family informed of [facility]' ownership interest in [facility], as well as of the fact that they are under no obligation to receive care at this facility.  PASARR submitted to EDS on 11/03/2013 PASARR number received on 11/03/2013  FL2 transmitted to all facilities in geographic area requested by pt/family on  11/03/2013 FL2 transmitted to all facilities within larger geographic area on   Patient informed that his/her managed care company has contracts with or will negotiate with  certain facilities, including the following:     Patient/family informed of bed offers received:  11/05/2013 Patient chooses bed at [facility] Physician recommends and patient chooses bed at  [facility]  Patient to be transferred to  on   Patient to be transferred to facility by  Patient and family notified of transfer on  Name of family member notified:    The following physician request were entered in Epic:   Additional Comments:  [name], [place]

## 2013-11-05 NOTE — Progress Notes (Signed)
    Primary cardiologist: Dr. Kate Sable (seen in consultation 05/05/2013)  Consulting cardiologist: Dr. Satira Sark  Subjective:   Up in bedside chair. Hip soreness described. No chest pain or palpitations.   Objective:   Temp:  [97.5 F (36.4 C)-99.4 F (37.4 C)] 97.9 F (36.6 C) (07/30 0613) Pulse Rate:  [27-112] 89 (07/30 0613) Resp:  [12-23] 20 (07/30 0800) BP: (99-211)/(59-100) 112/64 mmHg (07/30 0613) SpO2:  [92 %-100 %] 100 % (07/30 0800) Last BM Date: 11/04/13  Filed Weights   11/01/13 2256 11/02/13 0535  Weight: 173 lb (78.472 kg) 155 lb (70.308 kg)    Intake/Output Summary (Last 24 hours) at 11/05/13 1027 Last data filed at 11/05/13 0940  Gross per 24 hour  Intake 1391.25 ml  Output    750 ml  Net 641.25 ml    Exam:  General: Elderly, somewhat frail-appearing, no distress.  Lungs: Clear, nonlabored.  Cardiac: RRR with ectopy, no gallop or significant murmur.  Extremities: No pitting edema.   Lab Results:  Basic Metabolic Panel:  Recent Labs Lab 11/03/13 0553 11/04/13 0623 11/05/13 0555  NA 139 137 136*  K 4.0 3.9 4.2  CL 102 99 98  CO2 26 24 27   GLUCOSE 125* 145* 153*  BUN 10 11 12   CREATININE 0.71 0.55 0.58  CALCIUM 9.2 9.3 9.3    Liver Function Tests:  Recent Labs Lab 11/05/13 0555  AST 28  ALT 14  ALKPHOS 68  BILITOT 0.9  PROT 6.4  ALBUMIN 2.7*    CBC:  Recent Labs Lab 11/03/13 1904 11/04/13 0623 11/05/13 0555  WBC 14.1* 14.4* 15.2*  HGB 14.2 14.1 12.4  HCT 41.3 41.3 36.9  MCV 94.1 93.4 95.3  PLT 181 203 224    Cardiac Enzymes:  Recent Labs Lab 11/02/13 0121  TROPONINI <0.30    ECG: Sinus rhythm with PACs and PVCs, left anterior fascicular block, nonspecific ST-T changes.   Medications:   Scheduled Medications: . allopurinol  100 mg Oral BID  . citalopram  20 mg Oral QHS  . docusate sodium  100 mg Oral BID  . donepezil  10 mg Oral QHS  . enoxaparin (LOVENOX) injection  30 mg  Subcutaneous Q24H  . insulin aspart  0-9 Units Subcutaneous TID WC  . lisinopril  10 mg Oral q morning - 10a  . metoprolol tartrate  12.5 mg Oral BID  . pantoprazole  40 mg Oral Daily  . senna  1 tablet Oral BID  . simvastatin  40 mg Oral QHS     Infusions: . sodium chloride 75 mL/hr at 11/05/13 0821     PRN Medications:  acetaminophen, acetaminophen, ALPRAZolam, alum & mag hydroxide-simeth, bisacodyl, menthol-cetylpyridinium, methocarbamol (ROBAXIN) IV, methocarbamol, metoCLOPramide (REGLAN) injection, metoCLOPramide, morphine injection, ondansetron (ZOFRAN) IV, ondansetron, oxyCODONE-acetaminophen, phenol, senna-docusate   Assessment:   1. Postop right hip gamma nail fixation on 7/29 by Dr. Aline Brochure.  2. History of COPD and scleroderma.  3. Lexiscan Cardiolite from January of this year demonstrated an inferior wall defect that was partially reversible, related to either variable subdiaphragmatic attenuation versus scar with very mild peri-infarct ischemia, overall low risk, with normal LVEF. No angina symptoms.   Plan/Discussion:    Continue medical therapy and observation for now, progress with PT/OT as tolerated. ECG for tomorrow.   Satira Sark, M.D., F.A.C.C.

## 2013-11-05 NOTE — Addendum Note (Signed)
Addendum created 11/05/13 1131 by Vista Deck, CRNA   Modules edited: Notes Section   Notes Section:  File: [id number]

## 2013-11-05 NOTE — Evaluation (Signed)
Physical Therapy Evaluation Patient Details Name: Tina [name] MRN: [id number] DOB: 1933-12-04 Today's Date: 11/05/2013   History of Present Illness  80 yo female h/o htn, dm, copd, raynauds comes in after falling going to the bathroom tonight after taking some valium/ambien.  History mainly obtained from EDP (obtained from dtr).  Pt hurt her right hip.  She was in her normal state of health prior to this.  Has fracture right hip.  Pt denies any head injury.  Xray revealed comminuted impacted mildly angulated Rt femur intertrochanteric fracture.  Pt underwent internal fixation of Rt hip withgamma nail on 11/04/13; MD orders for WBAT Rt LE.   Clinical Impression  Pt is a 80 year old female who presents to physical therapy after sustaining a fall resulting in a Rt hip fracture.  Pt underwent internal fixation of Rt hip with gamma nail on 11/04/13; MD orders for WBAT Rt LE.  During evaluation, pt required max assist for bed mobility skills, max assist x2 for sit <-> stand transfer, and max assist x1 for stand pivot transfer.  Pt required assisted W/S and max assist for positioning of RW during transfer.  Pt reports increased levels of pain with mobility skills to severe levels; pt given pain pill prior to PT evaluation.  Noted increased complaints of pain in Rt LE with attempted movement of Rt knee and hip; no complaints of pain with AROM of Rt ankle.  Recommend continued PT while in the hospital to address strengthening, activity tolerance, pain management, and balance for improvement of functional mobility skills with transition to SNF at discharge.  No DME recommendations at this time; will defer to SNF.     Follow Up Recommendations SNF    Equipment Recommendations  Other (comment) (Will defer to SNF. )       Precautions / Restrictions Precautions Precautions: Fall Restrictions Weight Bearing Restrictions: Yes RLE Weight Bearing: Weight bearing as tolerated      Mobility  Bed  Mobility Overal bed mobility: Needs Assistance Bed Mobility: Supine to Sit     Supine to sit: Max assist        Transfers Overall transfer level: Needs assistance   Transfers: Sit to/from Stand;Stand Pivot Transfers Sit to Stand: From elevated surface;+2 physical assistance;Max assist Stand pivot transfers: Max assist (for assisted W/S and positioning of RW)          Ambulation/Gait             General Gait Details: Not assessed as pt unable to WB on Rt LE to clear Lt foot.       Balance Overall balance assessment: History of Falls;Needs assistance Sitting-balance support: Feet unsupported;Single extremity supported Sitting balance-Leahy Scale: Fair Sitting balance - Comments: Close SBA/CGA required when sitting at EOB    Standing balance support: During functional activity;Bilateral upper extremity supported (On RW with max assist ) Standing balance-Leahy Scale: Poor                               Pertinent Vitals/Pain Pain 9/10; RN gave pain meds prior to PT evaluation.      Home Living Family/patient expects to be discharged to:: Skilled nursing facility Living Arrangements: Alone (Daughter stays with her at night. ) Available Help at Discharge: Family;Available 24 hours/day Type of Home: House Home Access: Stairs to enter Entrance Stairs-Rails: None Entrance Stairs-Number of Steps: 1 Home Layout: Laundry or work area in basement (Daughter does her  laundry. ) Home Equipment: Grab bars - toilet;Grab bars - tub/shower;Hospital bed [facility]) Additional Comments: Tub shower    Prior Function Level of Independence: Independent;Needs assistance   Gait / Transfers Assistance Needed: Pt reports (I) with bed mobilty skills, transfers, and ambulation skills.    ADL's / Homemaking Assistance Needed: Pt requires assistance with laundry.  Pt still drives and cooks simple meals.             Extremity/Trunk Assessment   Upper Extremity Assessment:  Defer to OT evaluation           Lower Extremity Assessment: RLE deficits/detail;LLE deficits/detail RLE Deficits / Details: Unable to assess hip/knee secondary to severe pain.  Pt able to actively move toes and ankle in all directions.  LLE Deficits / Details: MMT hip NT secondary to pain in Rt hip with movement.  MMT knee 3/5, ankle 3+/5.      Communication   Communication: No difficulties  Cognition Arousal/Alertness: Awake/alert Behavior During Therapy: WFL for tasks assessed/performed Overall Cognitive Status: History of cognitive impairments - at baseline                               Assessment/Plan    PT Assessment Patient needs continued PT services  PT Diagnosis Difficulty walking;Generalized weakness;Acute pain   PT Problem List Decreased strength;Decreased range of motion;Decreased activity tolerance;Decreased safety awareness;Decreased balance;Decreased mobility;Pain  PT Treatment Interventions DME instruction;Balance training;Gait training;Neuromuscular re-education;Functional mobility training;Therapeutic activities;Therapeutic exercise;Patient/family education;Stair training   PT Goals (Current goals can be found in the Care Plan section) Acute Rehab PT Goals Patient Stated Goal: decrease pain with movement PT Goal Formulation: With patient/family Time For Goal Achievement: 11/12/13 Potential to Achieve Goals: Good    Frequency 7X/week    End of Session Equipment Utilized During Treatment: Gait belt Activity Tolerance: Patient limited by pain Patient left: in chair;with call bell/phone within reach;with chair alarm set;with family/visitor present           Time: 0830-0859 PT Time Calculation (min): 29 min   Charges:   PT Evaluation $Initial PT Evaluation Tier I: 1 Procedure     Aravind Chrismer 11/05/2013, 9:14 AM

## 2013-11-05 NOTE — Progress Notes (Signed)
Patient ID: Tina [name], female   DOB: Jan 06, 1934, 80 y.o.   MRN: [id number] Postop day 1 status post gamma nail fixation right hip overall doing well with stable vital signs stable heart rate mild discomfort in the right hip  Recommend initiation of physical therapy.    BP 112/64  Pulse 89  Temp(Src) 97.9 F (36.6 C) (Oral)  Resp 20  Ht 5' 7.5" (1.715 m)  Wt 155 lb (70.308 kg)  BMI 23.90 kg/m2  SpO2 100%  Hemoglobin & Hematocrit     Component Value Date/Time   HGB 12.4 11/05/2013 0555   HCT 36.9 11/05/2013 0555

## 2013-11-05 NOTE — Progress Notes (Addendum)
I have seen this patient independently, discussed POC with MID-level provider and patient and ammended note and plan  where needed  Verneita Griffes, MD [facility] [phone]   80 y/o ?, history of Scleroderma 1988, chronic GERD +Barrett's esophagus [s/p palpitation 5/13], IBS, right lung surgery for pneumothorax 1988, psoriasis, diet controlled diabetes, HTN, COPD, possible one-vessel CAD with Lexiscan Cardiolite 1/15 = partially reversible inferior wall deficit.  baseline 4 mets metabolic functionality  Underwent INTERNAL FIXATION RIGHT HIP WITH GAMMA NAIL (Right) 11/04/13 Dr. Aline Brochure  -seen at bedside -pain control much better -family all at bedside -EKG in am to ensure QTc has resolved per Cardiology-today was 428/546 -tol diet. -likely can d/c SNF in am  Verneita Griffes, MD [facility] [phone]

## 2013-11-05 NOTE — Evaluation (Signed)
Occupational Therapy Evaluation Patient Details Name: Tina [name] MRN: [id number] DOB: [birth year] Today's Date: 11/05/2013    History of Present Illness 80 yo female h/o htn, dm, copd, raynauds comes in after falling going to the bathroom tonight after taking some valium/ambien.  History mainly obtained from EDP (obtained from dtr).  Pt hurt her right hip.  She was in her normal state of health prior to this.  Has fracture right hip.  Pt denies any head injury.  Xray revealed comminuted impacted mildly angulated Rt femur intertrochanteric fracture.  Pt underwent internal fixation of Rt hip withgamma nail on 11/04/13; MD orders for WBAT Rt LE.    Clinical Impression   Pt is presenting to acute OT with above situation.  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  Pt has decreased ADL status (max assist with LE activities) due to recent hip surgery and will benefit from continued OT services to improve ADL status and return to PLOF.  Recommend SNF OT at this time.    Follow Up Recommendations  SNF    Equipment Recommendations  Other (comment) (Defer to SNF)    Recommendations for Other Services       Precautions / Restrictions Precautions Precautions: Fall Restrictions Weight Bearing Restrictions: Yes RLE Weight Bearing: Weight bearing as tolerated      Mobility Bed Mobility        Transfers          Balance                              ADL Overall ADL's : Needs assistance/impaired Eating/Feeding: Set up Eating/Feeding Details (indicate cue type and reason): and verbal encouragement Grooming: Set up               Lower Body Dressing: Maximal assistance   Toilet Transfer: Maximal assistance   Toileting- Clothing Manipulation and Hygiene: Maximal assistance       Functional mobility during ADLs: Maximal assistance       Vision                     Perception     Praxis      Pertinent Vitals/Pain      Hand Dominance Right   Extremity/Trunk Assessment Upper Extremity Assessment Upper Extremity Assessment: Overall WFL for tasks assessed (Pt has flexion contractures in bilateral PIP and DIP joint contractures)   Lower Extremity Assessment Lower Extremity Assessment: Defer to PT evaluation RLE Deficits / Details: Unable to assess hip/knee secondary to severe pain.  Pt able to actively move toes and ankle in all directions.  RLE: Unable to fully assess due to pain LLE Deficits / Details: MMT hip NT secondary to pain in Rt hip with movement.  MMT knee 3/5, ankle 3+/5.        Communication Communication Communication: No difficulties   Cognition Arousal/Alertness: Awake/alert Behavior During Therapy: WFL for tasks assessed/performed Overall Cognitive Status: History of cognitive impairments - at baseline                     General Comments       Exercises       Shoulder Instructions      Home Living Family/patient expects to be discharged to:: Skilled nursing facility Living Arrangements: Alone (  Daughter stays at night, or pt stays wtih other daughter for ~1 week at a time) Available Help at Discharge: Family;Available 24 hours/day Type of Home: House Home Access: Stairs to enter [employer] of Steps: 1 Entrance Stairs-Rails: None Home Layout: Laundry or work area in basement;One level     Bathroom Shower/Tub: Teacher, early years/pre: Standard     Home Equipment: Grab bars - toilet;Grab bars - tub/shower;Hospital bed   Additional Comments: Tub shower      Prior Functioning/Environment Level of Independence: Needs assistance  Gait / Transfers Assistance Needed: Pt reports (I) with bed mobilty skills, transfers, and ambulation skills.   ADL's / Homemaking Assistance Needed: Daughter provides assist with laundry.  Pt no longer drives (daughter reports  concern for pt getting lost).  pt is able to dress herself, feed herself, do light cleaning/cooking, and care for her dog.  Daughters are available to assist with all IADL needs.        OT Diagnosis: Other (comment) (Decreased ADL status)   OT Problem List: Decreased activity tolerance;Decreased knowledge of use of DME or AE;Pain;Other (comment) (Decreased ADL status)   OT Treatment/Interventions: Self-care/ADL training;Patient/family education;Therapeutic activities;DME and/or AE instruction;Energy conservation    OT Goals(Current goals can be found in the care plan section) Acute Rehab OT Goals Patient Stated Goal: No OT goals stated OT Goal Formulation: With patient/family Time For Goal Achievement: 11/19/13 Potential to Achieve Goals: Fair ADL Goals Pt Will Perform Lower Body Dressing: with mod assist Pt Will Transfer to Toilet: with mod assist Pt Will Perform Toileting - Clothing Manipulation and hygiene: with mod assist  OT Frequency: Min 2X/week   Barriers to D/C:            Co-evaluation              End of Session Equipment Utilized During Treatment: Oxygen  Activity Tolerance: Patient tolerated treatment well;Patient limited by lethargy Patient left: in chair;with family/visitor present;with call bell/phone within reach   Time: 0911-0938 OT Time Calculation (min): 27 min Charges:  OT General Charges $OT Visit: 1 Procedure OT Evaluation $Initial OT Evaluation Tier I: 1 Procedure G-Codes:     Bea Graff, MS, OTR/L [phone]  11/05/2013, 9:46 AM

## 2013-11-05 NOTE — Progress Notes (Signed)
[facility] PROGRESS NOTE  Tina [name] YCX:[id number] DOB: Jul 05, 1933 DOA: 11/01/2013 PCP: Purvis Kilts, MD  Assessment/Plan: Hip fracture, right- from mechanical fall. Post op day #1. Pain controlled. Defer pain management, anticoagulation and weight bearing to ortho.    Prolonged Q-T interval on ECG- Significance unclear. Recent EKG with normal QT interval. Evaluated by cardiology who opine overall low to intermediate risk. Appreciate input   Hypertension: Fair control. Home meds include lisinopril and lopressor and HCTZ. Holding HCTZ for now. Repeat labs in a.m.   COPD: (chronic obstructive pulmonary disease): remains stable at baseline. Not on home oxygen.   DM (diabetes mellitus):HgA1c 5.1 CBG range 142-160. not on medication due to hx hypoglycemic episodes. QIDAC CBG's + SSI for control.   High cholesterol: Lipid panel with HDL 33 otherwise within the limits of normal. Hold statin   Leukocytosis: likely reactive. Tending up somewhat today.  Urinalysis unremarkable, chest xray without infiltrate. Will monitor.   Anemia: likely related to #1 in setting of chronic disease. Baseline appears to be 12. Anemia panel with iron 24 and TIBC 245 otherwise within the limits of normal. S/p 3 units PRBC's per ortho. Hg 12 today   Dementia: remains at baseline.     Code Status: full Family Communication: daughter at baseline Disposition Plan: snf when ready hopefully 24-48 hours   Consultants:  Dr Aline Brochure orth  Dr Domenic Polite cardiology  Procedures: INTERNAL FIXATION RIGHT HIP WITH GAMMA NAIL (Right) 11/04/13   Antibiotics:  none  HPI/Subjective: Up in chair. Reports pain well controlled.   Objective: Filed Vitals:   11/05/13 1200  BP:   Pulse:   Temp:   Resp: 20    Intake/Output Summary (Last 24 hours) at 11/05/13 1255 Last data filed at 11/05/13 0940  Gross per 24 hour  Intake 1391.25 ml  Output    750 ml  Net 641.25 ml   Filed Weights   11/01/13  2256 11/02/13 0535  Weight: 78.472 kg (173 lb) 70.308 kg (155 lb)    Exam:   General:  Well nourished appears comfortable  Cardiovascular: RRR No MGR No LE edema  Respiratory: normal effort BS with improved air flow no wheeze  Abdomen: round, non-distended soft +BS non-tender to palpation  Musculoskeletal: dressing right hip dry and intact. Right foot warm and dry  Data Reviewed: Basic Metabolic Panel:  Recent Labs Lab 11/02/13 0121 11/03/13 0553 11/04/13 0623 11/05/13 0555  NA 137 139 137 136*  K 3.8 4.0 3.9 4.2  CL 100 102 99 98  CO2 26 26 24 27   GLUCOSE 139* 125* 145* 153*  BUN 10 10 11 12   CREATININE 0.77 0.71 0.55 0.58  CALCIUM 8.7 9.2 9.3 9.3   Liver Function Tests:  Recent Labs Lab 11/05/13 0555  AST 28  ALT 14  ALKPHOS 68  BILITOT 0.9  PROT 6.4  ALBUMIN 2.7*   No results found for this basename: LIPASE, AMYLASE,  in the last 168 hours No results found for this basename: AMMONIA,  in the last 168 hours CBC:  Recent Labs Lab 11/02/13 0121 11/03/13 0553 11/03/13 0730 11/03/13 1904 11/04/13 0623 11/05/13 0555  WBC 49.7* 02.6* DUPLICATE REQUEST 37.8* 14.4* 15.2*  NEUTROABS 13.1*  --  PENDING  --  10.0* 10.6*  HGB 58.8* 50.2* DUPLICATE REQUEST 77.4 12.8 12.4  HCT 78.6* 76.7 DUPLICATE REQUEST 20.9 47.0 36.9  MCV 962.8* 36.6 DUPLICATE REQUEST 29.4 76.5 95.3  PLT 465 035 DUPLICATE REQUEST [phone]   Cardiac Enzymes:  Recent Labs Lab 11/02/13 0121  TROPONINI <0.30   BNP (last 3 results) No results found for this basename: PROBNP,  in the last 8760 hours CBG:  Recent Labs Lab 2013-11-18 1448 11-18-2013 1633 11-18-2013 2010 11/05/13 0713 11/05/13 1149  GLUCAP 125* 119* 176* 142* 160*    Recent Results (from the past 240 hour(s))  URINE CULTURE     Status: None   Collection Time    11/02/13  2:28 AM      Result Value Ref Range Status   Specimen Description URINE, CATHETERIZED   Final   Special Requests NONE   Final   Culture  Setup  Time     Final   Value: 11/02/2013 13:44     Performed at [place] [place]     Final   Value: 75,000 COLONIES/ML     Performed at [facility]   Culture     Final   Value: ENTEROBACTER AEROGENES     DIPHTHEROIDS(CORYNEBACTERIUM SPECIES)     Note: Standardized susceptibility testing for this organism is not available.     Performed at [facility]   Report Status 18-Nov-2013 FINAL   Final   Organism ID, Bacteria ENTEROBACTER AEROGENES   Final  MRSA PCR SCREENING     Status: None   Collection Time    11/18/13 12:53 AM      Result Value Ref Range Status   MRSA by PCR NEGATIVE  NEGATIVE Final   Comment:            The GeneXpert MRSA Assay (FDA     approved for NASAL specimens     only), is one component of a     comprehensive MRSA colonization     surveillance program. It is not     intended to diagnose MRSA     infection nor to guide or     monitor treatment for     MRSA infections.     Studies: Dg Hip Operative Right  2013-11-18   CLINICAL DATA:  Right hip fracture  EXAM: DG OPERATIVE RIGHT HIP  TECHNIQUE: A single spot fluoroscopic AP image of the right hip is submitted.  COMPARISON:  11/02/2013  FINDINGS: Proximal right femoral fracture is again noted. A proximal medullary rod with compression screw is now in place. The fracture fragments with the exception of the lesser trochanter are are in near anatomic alignment. 1 min and 18 seconds of fluoroscopy was utilized.   Electronically Signed   By: Inez Catalina M.D.   On: 11/18/13 14:21    Scheduled Meds: . allopurinol  100 mg Oral BID  . citalopram  20 mg Oral QHS  . docusate sodium  100 mg Oral BID  . donepezil  10 mg Oral QHS  . enoxaparin (LOVENOX) injection  30 mg Subcutaneous Q24H  . insulin aspart  0-9 Units Subcutaneous TID WC  . lisinopril  10 mg Oral q morning - 10a  . metoprolol tartrate  12.5 mg Oral BID  . pantoprazole  40 mg Oral Daily  . senna  1 tablet Oral BID  .  simvastatin  40 mg Oral QHS   Continuous Infusions:   Principal Problem:   Hip fracture, right Active Problems:   Hypertension   High cholesterol   Raynaud's disease   COPD (chronic obstructive pulmonary disease)   DM (diabetes mellitus)   Fall at home   Closed right hip fracture   Preoperative cardiovascular examination  Intertrochanteric fracture of right femur    Time spent: 35 minutes    [facility] Pager [phone]. If 7PM-7AM, please contact night-coverage at [email or web address], password [facility] 11/05/2013, 12:55 PM  LOS: 4 days

## 2013-11-05 NOTE — Clinical Social Work Note (Signed)
CSW presented bed offer at [facility] which pt and daughter, Langley Gauss accept. Facility notified. Awaiting stability for d/c.   [name], [place]

## 2013-11-05 NOTE — Anesthesia Postprocedure Evaluation (Signed)
  Anesthesia Post-op Note  Patient: Tina [name]  Procedure(s) Performed: Procedure(s): INTERNAL FIXATION RIGHT HIP WITH GAMMA NAIL (Right)  Patient Location: Nursing Unit  Anesthesia Type:Spinal  Level of Consciousness: awake and patient cooperative  Airway and Oxygen Therapy: Patient Spontanous Breathing  Post-op Pain: mild  Post-op Assessment: Post-op Vital signs reviewed, Patient's Cardiovascular Status Stable and Respiratory Function Stable  Post-op Vital Signs: Reviewed and stable    Complications: No apparent anesthesia complications

## 2013-11-06 ENCOUNTER — Encounter (HOSPITAL_COMMUNITY): Payer: Self-pay | Admitting: Orthopedic Surgery

## 2013-11-06 DIAGNOSIS — E46 Unspecified protein-calorie malnutrition: Secondary | ICD-10-CM | POA: Diagnosis not present

## 2013-11-06 DIAGNOSIS — S72009A Fracture of unspecified part of neck of unspecified femur, initial encounter for closed fracture: Secondary | ICD-10-CM | POA: Diagnosis not present

## 2013-11-06 DIAGNOSIS — F411 Generalized anxiety disorder: Secondary | ICD-10-CM | POA: Diagnosis not present

## 2013-11-06 DIAGNOSIS — M109 Gout, unspecified: Secondary | ICD-10-CM | POA: Diagnosis not present

## 2013-11-06 DIAGNOSIS — Z8673 Personal history of transient ischemic attack (TIA), and cerebral infarction without residual deficits: Secondary | ICD-10-CM | POA: Diagnosis not present

## 2013-11-06 DIAGNOSIS — R059 Cough, unspecified: Secondary | ICD-10-CM | POA: Diagnosis not present

## 2013-11-06 DIAGNOSIS — F068 Other specified mental disorders due to known physiological condition: Secondary | ICD-10-CM | POA: Diagnosis not present

## 2013-11-06 DIAGNOSIS — G4700 Insomnia, unspecified: Secondary | ICD-10-CM | POA: Diagnosis not present

## 2013-11-06 DIAGNOSIS — E7800 Pure hypercholesterolemia, unspecified: Secondary | ICD-10-CM | POA: Diagnosis not present

## 2013-11-06 DIAGNOSIS — F0390 Unspecified dementia without behavioral disturbance: Secondary | ICD-10-CM | POA: Diagnosis not present

## 2013-11-06 DIAGNOSIS — E559 Vitamin D deficiency, unspecified: Secondary | ICD-10-CM | POA: Diagnosis not present

## 2013-11-06 DIAGNOSIS — F329 Major depressive disorder, single episode, unspecified: Secondary | ICD-10-CM | POA: Diagnosis not present

## 2013-11-06 DIAGNOSIS — Z7401 Bed confinement status: Secondary | ICD-10-CM | POA: Diagnosis not present

## 2013-11-06 DIAGNOSIS — E119 Type 2 diabetes mellitus without complications: Secondary | ICD-10-CM | POA: Diagnosis not present

## 2013-11-06 DIAGNOSIS — F3289 Other specified depressive episodes: Secondary | ICD-10-CM | POA: Diagnosis not present

## 2013-11-06 DIAGNOSIS — W19XXXA Unspecified fall, initial encounter: Secondary | ICD-10-CM | POA: Diagnosis not present

## 2013-11-06 DIAGNOSIS — N281 Cyst of kidney, acquired: Secondary | ICD-10-CM | POA: Diagnosis not present

## 2013-11-06 DIAGNOSIS — D649 Anemia, unspecified: Secondary | ICD-10-CM | POA: Diagnosis not present

## 2013-11-06 DIAGNOSIS — R197 Diarrhea, unspecified: Secondary | ICD-10-CM | POA: Diagnosis not present

## 2013-11-06 DIAGNOSIS — N183 Chronic kidney disease, stage 3 unspecified: Secondary | ICD-10-CM | POA: Diagnosis not present

## 2013-11-06 DIAGNOSIS — R0989 Other specified symptoms and signs involving the circulatory and respiratory systems: Secondary | ICD-10-CM | POA: Diagnosis not present

## 2013-11-06 DIAGNOSIS — I7300 Raynaud's syndrome without gangrene: Secondary | ICD-10-CM | POA: Diagnosis not present

## 2013-11-06 DIAGNOSIS — Y92009 Unspecified place in unspecified non-institutional (private) residence as the place of occurrence of the external cause: Secondary | ICD-10-CM

## 2013-11-06 DIAGNOSIS — R109 Unspecified abdominal pain: Secondary | ICD-10-CM | POA: Diagnosis not present

## 2013-11-06 DIAGNOSIS — R1312 Dysphagia, oropharyngeal phase: Secondary | ICD-10-CM | POA: Diagnosis not present

## 2013-11-06 DIAGNOSIS — R1310 Dysphagia, unspecified: Secondary | ICD-10-CM | POA: Diagnosis not present

## 2013-11-06 DIAGNOSIS — J449 Chronic obstructive pulmonary disease, unspecified: Secondary | ICD-10-CM | POA: Diagnosis not present

## 2013-11-06 DIAGNOSIS — M6281 Muscle weakness (generalized): Secondary | ICD-10-CM | POA: Diagnosis not present

## 2013-11-06 DIAGNOSIS — Z9181 History of falling: Secondary | ICD-10-CM | POA: Diagnosis not present

## 2013-11-06 DIAGNOSIS — D72829 Elevated white blood cell count, unspecified: Secondary | ICD-10-CM | POA: Diagnosis not present

## 2013-11-06 DIAGNOSIS — M2550 Pain in unspecified joint: Secondary | ICD-10-CM | POA: Diagnosis not present

## 2013-11-06 DIAGNOSIS — K5900 Constipation, unspecified: Secondary | ICD-10-CM | POA: Diagnosis not present

## 2013-11-06 DIAGNOSIS — M349 Systemic sclerosis, unspecified: Secondary | ICD-10-CM | POA: Diagnosis not present

## 2013-11-06 DIAGNOSIS — S72143A Displaced intertrochanteric fracture of unspecified femur, initial encounter for closed fracture: Secondary | ICD-10-CM | POA: Diagnosis not present

## 2013-11-06 DIAGNOSIS — G4730 Sleep apnea, unspecified: Secondary | ICD-10-CM | POA: Diagnosis not present

## 2013-11-06 DIAGNOSIS — I10 Essential (primary) hypertension: Secondary | ICD-10-CM | POA: Diagnosis not present

## 2013-11-06 DIAGNOSIS — K2270 Barrett's esophagus without dysplasia: Secondary | ICD-10-CM | POA: Diagnosis not present

## 2013-11-06 DIAGNOSIS — E8779 Other fluid overload: Secondary | ICD-10-CM | POA: Diagnosis not present

## 2013-11-06 DIAGNOSIS — R488 Other symbolic dysfunctions: Secondary | ICD-10-CM | POA: Diagnosis not present

## 2013-11-06 DIAGNOSIS — A048 Other specified bacterial intestinal infections: Secondary | ICD-10-CM | POA: Diagnosis not present

## 2013-11-06 LAB — CBC
HEMATOCRIT: 34.5 % — AB (ref 36.0–46.0)
HEMOGLOBIN: 11.5 g/dL — AB (ref 12.0–15.0)
MCH: 31.7 pg (ref 26.0–34.0)
MCHC: 33.3 g/dL (ref 30.0–36.0)
MCV: 95 fL (ref 78.0–100.0)
Platelets: 249 10*3/uL (ref 150–400)
RBC: 3.63 MIL/uL — ABNORMAL LOW (ref 3.87–5.11)
RDW: 15.4 % (ref 11.5–15.5)
WBC: 13.4 10*3/uL — ABNORMAL HIGH (ref 4.0–10.5)

## 2013-11-06 LAB — TYPE AND SCREEN
ABO/RH(D): A POS
ANTIBODY SCREEN: NEGATIVE
UNIT DIVISION: 0
Unit division: 0
Unit division: 0
Unit division: 0
Unit division: 0

## 2013-11-06 LAB — PROTIME-INR
INR: 1.16 (ref 0.00–1.49)
Prothrombin Time: 14.8 seconds (ref 11.6–15.2)

## 2013-11-06 LAB — BASIC METABOLIC PANEL
Anion gap: 12 (ref 5–15)
BUN: 15 mg/dL (ref 6–23)
CO2: 26 mEq/L (ref 19–32)
Calcium: 9.3 mg/dL (ref 8.4–10.5)
Chloride: 99 mEq/L (ref 96–112)
Creatinine, Ser: 0.59 mg/dL (ref 0.50–1.10)
GFR calc Af Amer: >90 mL/min (ref >90)
GFR calc non Af Amer: 86 mL/min — ABNORMAL LOW (ref >90)
GLUCOSE: 138 mg/dL — AB (ref 70–99)
POTASSIUM: 4 meq/L (ref 3.7–5.3)
Sodium: 137 mEq/L (ref 137–147)

## 2013-11-06 LAB — GLUCOSE, CAPILLARY
GLUCOSE-CAPILLARY: 120 mg/dL — AB (ref 70–99)
GLUCOSE-CAPILLARY: 154 mg/dL — AB (ref 70–99)

## 2013-11-06 MED ORDER — HYDROCODONE-ACETAMINOPHEN 5-325 MG PO TABS: 2.0000 | ORAL_TABLET | ORAL | Status: DC | PRN

## 2013-11-06 MED ORDER — HYDROCHLOROTHIAZIDE 25 MG PO TABS: 25.0000 mg | ORAL_TABLET | Freq: Every morning | ORAL | Status: DC

## 2013-11-06 MED ORDER — METOPROLOL TARTRATE 25 MG PO TABS
12.5000 mg | ORAL_TABLET | Freq: Two times a day (BID) | ORAL | Status: DC
  Administered 2013-11-06: 12.5 mg via ORAL
  Filled 2013-11-06: qty 1

## 2013-11-06 MED ORDER — ENOXAPARIN SODIUM 30 MG/0.3ML ~~LOC~~ SOLN: 30.0000 mg | SUBCUTANEOUS | Status: DC

## 2013-11-06 MED ORDER — BISACODYL 5 MG PO TBEC: 5.0000 mg | DELAYED_RELEASE_TABLET | Freq: Every day | ORAL | Status: DC | PRN

## 2013-11-06 MED ORDER — LISINOPRIL 10 MG PO TABS: 10.0000 mg | ORAL_TABLET | Freq: Every morning | ORAL | Status: DC

## 2013-11-06 MED ORDER — ALPRAZOLAM 0.25 MG PO TABS: 0.2500 mg | ORAL_TABLET | Freq: Three times a day (TID) | ORAL | Status: AC | PRN

## 2013-11-06 NOTE — Progress Notes (Signed)
Late Entry 1620  The patient was assisted to prepare for transfer to [facility].  [facility] arrived to transfer the patient.  They were given the transfer packet and the patient was transferred via stretcher to the facility in stable condition. The patients IV was removed prior to discharge and the site was WNL AEB no redness, swelling, or c/o pain at the site.  The patients dressing was changed prior to d/c.  The site was inact with staples and no s/sx of infection was noted.   Report was called over to [facility] the receiving nurse over at [facility].  She verbalized understanding and voiced no further complaints or concerns at the time of Discharge.  She was told she could call me if needed until 7 pm today.

## 2013-11-06 NOTE — Progress Notes (Signed)
[facility] PROGRESS NOTE  Tina [name] YJE:[id number] DOB: [birth year] DOA: 11/01/2013 PCP: Purvis Kilts, MD  Assessment/Plan: Hip fracture, right- from mechanical fall. Post op day #2. Pain controlled. Defer pain management, anticoagulation and weight bearing to ortho.   Prolonged Q-T interval on ECG- Significance unclear. Recent EKG with normal QT interval. Evaluated by cardiology who opine overall low to intermediate risk. Appreciate input   Hypertension: BP soft initially this am. Recheck within normal limits.  Home meds include lisinopril and lopressor and HCTZ. HCTZ held. Monitor closely    COPD: (chronic obstructive pulmonary disease): remains stable at baseline. Not on home oxygen.   DM (diabetes mellitus):HgA1c 5.1 CBG range 120-147. not on medication due to hx hypoglycemic episodes. QIDAC CBG's + SSI for control.   High cholesterol: Lipid panel with HDL 33 otherwise within the limits of normal. Hold statin   Leukocytosis: likely reactive. Tending down. Urinalysis unremarkable, chest xray without infiltrate. Will monitor. Afebrile. Non-toxic appearing  Anemia: likely related to #1 in setting of chronic disease. Baseline appears to be 12. Anemia panel with iron 24 and TIBC 245 otherwise within the limits of normal. S/p 3 units PRBC's per ortho. Hg 11.5 today   Dementia: remains at baseline.     Code Status: full Family Communication: daughter at bedside Disposition Plan: snf when ready hopefully 24-48 hours   Consultants:  Dr Aline Brochure ortho  Procedures: INTERNAL FIXATION RIGHT HIP WITH GAMMA NAIL (Right) 11/04/13   Antibiotics:  none  HPI/Subjective: Awake but drowsy. Reports pain  Controlled.   Objective: Filed Vitals:   11/06/13 0700  BP: 129/89  Pulse: 101  Temp: 98.5 F (36.9 C)  Resp: 20    Intake/Output Summary (Last 24 hours) at 11/06/13 0934 Last data filed at 11/05/13 1404  Gross per 24 hour  Intake    240 ml  Output      0 ml   Net    240 ml   Filed Weights   11/01/13 2256 11/02/13 0535  Weight: 78.472 kg (173 lb) 70.308 kg (155 lb)    Exam:   General:  Well nourished NAD  Cardiovascular: RRR No MGR No LE edema  Respiratory: normal effort BS with fair air flow. No crackles or wheeze  Abdomen: non-distended +BS non-tender to palpation  Musculoskeletal: dressing right hip small amount dried drainage right foot warm and dry   Data Reviewed: Basic Metabolic Panel:  Recent Labs Lab 11/02/13 0121 11/03/13 0553 11/04/13 0623 11/05/13 0555 11/06/13 0617  NA 137 139 137 136* 137  K 3.8 4.0 3.9 4.2 4.0  CL 100 102 99 98 99  CO2 26 26 24 27 26   GLUCOSE 139* 125* 145* 153* 138*  BUN 10 10 11 12 15   CREATININE 0.77 0.71 0.55 0.58 0.59  CALCIUM 8.7 9.2 9.3 9.3 9.3   Liver Function Tests:  Recent Labs Lab 11/05/13 0555  AST 28  ALT 14  ALKPHOS 68  BILITOT 0.9  PROT 6.4  ALBUMIN 2.7*   No results found for this basename: LIPASE, AMYLASE,  in the last 168 hours No results found for this basename: AMMONIA,  in the last 168 hours CBC:  Recent Labs Lab 11/02/13 0121  11/03/13 0730 11/03/13 1904 11/04/13 0623 11/05/13 0555 11/06/13 0617  WBC 63.7*  < > DUPLICATE REQUEST 85.8* 14.4* 15.2* 13.4*  NEUTROABS 13.1*  --  PENDING  --  10.0* 10.6*  --   HGB 85.0*  < > DUPLICATE REQUEST 27.7 41.2 12.4 11.5*  HCT 17.6*  < > DUPLICATE REQUEST 16.0 73.7 36.9 34.5*  MCV 106.2*  < > DUPLICATE REQUEST 69.4 85.4 95.3 95.0  PLT 627  < > DUPLICATE REQUEST 035 009 224 249  < > = values in this interval not displayed. Cardiac Enzymes:  Recent Labs Lab 11/02/13 0121  TROPONINI <0.30   BNP (last 3 results) No results found for this basename: PROBNP,  in the last 8760 hours CBG:  Recent Labs Lab 11/05/13 0713 11/05/13 1149 11/05/13 1636 11/05/13 2105 11/06/13 0732  GLUCAP 142* 160* 115* 147* 120*    Recent Results (from the past 240 hour(s))  URINE CULTURE     Status: None   Collection  Time    11/02/13  2:28 AM      Result Value Ref Range Status   Specimen Description URINE, CATHETERIZED   Final   Special Requests NONE   Final   Culture  Setup Time     Final   Value: 11/02/2013 13:44     Performed at [place]     Final   Value: 75,000 COLONIES/ML     Performed at [facility]   Culture     Final   Value: ENTEROBACTER AEROGENES     DIPHTHEROIDS(CORYNEBACTERIUM SPECIES)     Note: Standardized susceptibility testing for this organism is not available.     Performed at [facility]   Report Status 11-22-13 FINAL   Final   Organism ID, Bacteria ENTEROBACTER AEROGENES   Final  MRSA PCR SCREENING     Status: None   Collection Time    11/22/2013 12:53 AM      Result Value Ref Range Status   MRSA by PCR NEGATIVE  NEGATIVE Final   Comment:            The GeneXpert MRSA Assay (FDA     approved for NASAL specimens     only), is one component of a     comprehensive MRSA colonization     surveillance program. It is not     intended to diagnose MRSA     infection nor to guide or     monitor treatment for     MRSA infections.     Studies: Dg Hip Operative Right  2013/11/22   CLINICAL DATA:  Right hip fracture  EXAM: DG OPERATIVE RIGHT HIP  TECHNIQUE: A single spot fluoroscopic AP image of the right hip is submitted.  COMPARISON:  11/02/2013  FINDINGS: Proximal right femoral fracture is again noted. A proximal medullary rod with compression screw is now in place. The fracture fragments with the exception of the lesser trochanter are are in near anatomic alignment. 1 min and 18 seconds of fluoroscopy was utilized.   Electronically Signed   By: Inez Catalina M.D.   On: 2013/11/22 14:21    Scheduled Meds: . allopurinol  100 mg Oral BID  . citalopram  20 mg Oral QHS  . docusate sodium  100 mg Oral BID  . donepezil  10 mg Oral QHS  . enoxaparin (LOVENOX) injection  30 mg Subcutaneous Q24H  . insulin aspart  0-9 Units Subcutaneous  TID WC  . lisinopril  10 mg Oral q morning - 10a  . pantoprazole  40 mg Oral Daily  . senna  1 tablet Oral BID  . simvastatin  40 mg Oral QHS   Continuous Infusions:   Principal Problem:   Hip fracture, right Active Problems:  Hypertension   High cholesterol   Raynaud's disease   COPD (chronic obstructive pulmonary disease)   DM (diabetes mellitus)   Fall at home   Closed right hip fracture   Preoperative cardiovascular examination   Intertrochanteric fracture of right femur    Time spent: 35 minutes    [facility] Pager [phone]. If 7PM-7AM, please contact night-coverage at [email or web address], password [facility] 11/06/2013, 9:34 AM  LOS: 5 days

## 2013-11-06 NOTE — Discharge Summary (Signed)
-   I have reviewed the plan end date of above endocrine with such. - Follow up with orthopedic surgeon in the month, repeated EKG showed QTC stable actually mildly improved. - Skilled nursing facility. His metoprolol today. Received in other blood pressure medications and wek.

## 2013-11-06 NOTE — Clinical Social Work Note (Signed)
Pt d/c today to [facility]. Pt, Tina [name], and facility aware and agreeable. D/C summary faxed. Pt to transfer via [facility].  [name], [place]

## 2013-11-06 NOTE — Progress Notes (Signed)
Physical Therapy Treatment Patient Details Name: Tina [name] MRN: [id number] DOB: Aug 06, 1933 Today's Date: 11/06/2013    History of Present Illness      PT Comments    Pt pleasant and willing to participate with physical therapy.  Pt required multimodal cueing for proper handplacement with bed mobiltiy with mod to max assistance.  Pt limited by pain Rt LE with movement.  Mod to max assistance with sit to stand with multimodal cueing for handplacement for safety.  Gait training x 5 feet with max assistance, pt with difficulty shifting weight requiring cueing to pick up feet and progress forward.    Follow Up Recommendations        Equipment Recommendations       Recommendations for Other Services       Precautions / Restrictions Precautions Precautions: Fall Restrictions Weight Bearing Restrictions: Yes RLE Weight Bearing: Weight bearing as tolerated    Mobility  Bed Mobility Overal bed mobility: Needs Assistance Bed Mobility: Supine to Sit     Supine to sit: +2 for safety/equipment;Max assist        Transfers Overall transfer level: Needs assistance   Transfers: Sit to/from Stand;Stand Pivot Transfers Sit to Stand: From elevated surface;+2 physical assistance;Max assist Stand pivot transfers: Max assist          Ambulation/Gait Ambulation/Gait assistance: Max assist Ambulation Distance (Feet): 5 Feet Assistive device: Rolling walker (2 wheeled) Gait Pattern/deviations: Step-to pattern;Decreased stance time - right;Decreased stride length   Gait velocity interpretation: Below normal speed for age/gender     Stairs            Wheelchair Mobility    Modified Rankin (Stroke Patients Only)       Balance                                    Cognition Arousal/Alertness: Awake/alert Behavior During Therapy: WFL for tasks assessed/performed Overall Cognitive Status: History of cognitive impairments - at baseline                       Exercises      General Comments        Pertinent Vitals/Pain Pt c/o pain Rt hip with weight bearing     Home Living                      Prior Function            PT Goals (current goals can now be found in the care plan section) Progress towards PT goals: Progressing toward goals    Frequency       PT Plan Current plan remains appropriate    Co-evaluation             End of Session Equipment Utilized During Treatment: Gait belt Activity Tolerance: Patient limited by pain Patient left: in chair;with call bell/phone within reach;with chair alarm set;with family/visitor present     Time: 0932-3557 PT Time Calculation (min): 32 min  Charges:  $Gait Training: 8-22 mins $Therapeutic Activity: 8-22 mins                    G Codes:      Tina [name] 11/06/2013, 1:18 PM

## 2013-11-06 NOTE — Progress Notes (Signed)
-   Have reviewed the plan as above and agree with it. - EKG was done that showed a QTC just mildly improved. - Physical therapy has evaluated the patient recommended skilled nursing facility. - Presumed blood pressure medications in 1 week if her blood pressures trending up to be evaluated by PCP at that facility. - Patient okay to be discharged to skilled nursing facility.

## 2013-11-06 NOTE — Progress Notes (Signed)
Patient ID: Tina [name], female   DOB: 29-Nov-1933, 80 y.o.   MRN: [id number]  Orthopaedic Instructions for Gamma Nail  Right  hip fracture   S/P gamma nail   Weight bearing as tolerated   Remove staples POD # 12  F/U in 1 month for x-rays   Lovenox x 28 days

## 2013-11-06 NOTE — Clinical Social Work Placement (Signed)
Clinical Social Work Department CLINICAL SOCIAL WORK PLACEMENT NOTE 11/06/2013  Patient:  Tina [name], Tina [name]  Account Number:  [id number] Admit date:  11/01/2013  Clinical Social Worker:  [name], LCSW  Date/time:  11/03/2013 11:24 AM  Clinical Social Work is seeking post-discharge placement for this patient at the following level of care:   [place]   (*CSW will update this form in Epic as items are completed)   11/03/2013  Patient/family provided with [place] Department of Clinical Social Work's list of facilities offering this level of care within the geographic area requested by the patient (or if unable, by the patient's family).  11/03/2013  Patient/family informed of their freedom to choose among providers that offer the needed level of care, that participate in Medicare, Medicaid or managed care program needed by the patient, have an available bed and are willing to accept the patient.  11/03/2013  Patient/family informed of [facility]' ownership interest in [facility], as well as of the fact that they are under no obligation to receive care at this facility.  PASARR submitted to EDS on 11/03/2013 PASARR number received on 11/03/2013  FL2 transmitted to all facilities in geographic area requested by pt/family on  11/03/2013 FL2 transmitted to all facilities within larger geographic area on   Patient informed that his/her managed care company has contracts with or will negotiate with  certain facilities, including the following:     Patient/family informed of bed offers received:  11/05/2013 Patient chooses bed at [place] Physician recommends and patient chooses bed at  [facility]  Patient to be transferred to [place] on  11/06/2013 Patient to be transferred to facility by [facility] Patient and family notified of transfer on 11/06/2013 Name of family member notified:  Langley Gauss- Daughter  The following physician  request were entered in Epic:   Additional Comments:  [name], [place]

## 2013-11-06 NOTE — Discharge Summary (Signed)
Physician Discharge Summary  Tina [name] YHC:[id number] DOB: 13-Jul-1933 DOA: 11/01/2013  PCP: Purvis Kilts, MD  Admit date: 11/01/2013 Discharge date: 11/06/2013  Time spent: 40 minutes  Recommendations for Outpatient Follow-up:  1. Follow up with Dr Aline Brochure 1 month. Remove staples POD#12 2. Discharge to [facility]. Weight bearing as tolerated.   Discharge Diagnoses:  Principal Problem:   Hip fracture, right Active Problems:   Hypertension   High cholesterol   Raynaud's disease   COPD (chronic obstructive pulmonary disease)   DM (diabetes mellitus)   Fall at home   Closed right hip fracture   Preoperative cardiovascular examination   Intertrochanteric fracture of right femur   Discharge Condition: stable  Diet recommendation: carb modified  Filed Weights   11/01/13 2256 11/02/13 0535  Weight: 78.472 kg (173 lb) 70.308 kg (155 lb)    History of present illness:  80 yo female h/o htn, dm, copd, raynauds comes ED on 11/02/13 after falling going to the bathroom. after taking some valium/ambien. History mainly obtained from EDP (obtained from dtr). Pt hurt her right hip. She was in her normal state of health prior to this. Has fracture right hip. Pt denied any head injury.   Hospital Course:  Hip fracture, right- from mechanical fall. Post op day #2. Pain controlled. Weight bearing as tolerated and lovenox for 28 days per orhto. Follow up with Dr Aline Brochure 1 month   Prolonged Q-T interval on ECG- Recent EKG with normal QT interval. Evaluated by cardiology who opined overall low to intermediate risk. Appreciate input   Hypertension: BP stable. Home meds include cardizem, lisinopril and lopressor and HCTZ. Resume cardizem at discharge. Hold HCTZ, lopressor and lisinopril for 1 week then resume.    COPD: (chronic obstructive pulmonary disease): remained stable at baseline. Not on home oxygen.   DM (diabetes mellitus): Controlled. HgA1c 5.1 CBG range 120-147.  not on medication due to hx hypoglycemic episodes in past.    High cholesterol: Lipid panel with HDL 33 otherwise within the limits of normal.    Leukocytosis: likely reactive. Tending down. Urinalysis unremarkable, chest xray without infiltrate. Will monitor. Afebrile. Non-toxic appearing   Anemia: likely related to #1 in setting of chronic disease. Baseline appears to be 12. Anemia panel with iron 24 and TIBC 245 otherwise within the limits of normal. S/p 3 units PRBC's per ortho. Hg 11.5 at discharge  Dementia: remains at baseline.    Procedures:  Right ORIF 11/04/13  Consultations:  Dr Aline Brochure ortho  Discharge Exam: Filed Vitals:   11/06/13 1302  BP:   Pulse: 84  Temp:   Resp:     General: well nourished NAD Cardiovascular: RRR No MGR No LE edema Respiratory: normal effort BS clear bilaterally no wheeze  Discharge Instructions You were cared for by a hospitalist during your hospital stay. If you have any questions about your discharge medications or the care you received while you were in the hospital after you are discharged, you can call the unit and asked to speak with the hospitalist on call if the hospitalist that took care of you is not available. Once you are discharged, your primary care physician will handle any further medical issues. Please note that NO REFILLS for any discharge medications will be authorized once you are discharged, as it is imperative that you return to your primary care physician (or establish a relationship with a primary care physician if you do not have one) for your aftercare needs so that they  can reassess your need for medications and monitor your lab values.     Medication List         allopurinol 100 MG tablet  Commonly known as:  ZYLOPRIM  Take 100 mg by mouth 2 (two) times daily.     ALPRAZolam 0.25 MG tablet  Commonly known as:  XANAX  Take 0.25 mg by mouth 3 (three) times daily as needed for anxiety.     aspirin EC 81 MG  tablet  Take 81 mg by mouth 2 (two) times daily.     bisacodyl 5 MG EC tablet  Commonly known as:  DULCOLAX  Take 1 tablet (5 mg total) by mouth daily as needed for moderate constipation.     CARTIA XT 180 MG 24 hr capsule  Generic drug:  diltiazem  Take 180 mg by mouth 2 (two) times daily.     citalopram 20 MG tablet  Commonly known as:  CELEXA  Take 20 mg by mouth at bedtime.     donepezil 10 MG tablet  Commonly known as:  ARICEPT  Take 10 mg by mouth at bedtime.     enoxaparin 30 MG/0.3ML injection  Commonly known as:  LOVENOX  Inject 0.3 mLs (30 mg total) into the skin daily.     hydrochlorothiazide 25 MG tablet  Commonly known as:  HYDRODIURIL  Take 1 tablet (25 mg total) by mouth every morning. Resume on 11/13/13.     HYDROcodone-acetaminophen 5-325 MG per tablet  Commonly known as:  NORCO  Take 2 tablets by mouth every 4 (four) hours as needed.     Iron 325 (65 FE) MG Tabs  Take by mouth.     lisinopril 10 MG tablet  Commonly known as:  PRINIVIL,ZESTRIL  Take 1 tablet (10 mg total) by mouth every morning. Resume 11/13/13     metoprolol tartrate 25 MG tablet  Commonly known as:  LOPRESSOR  Take 12.5 mg by mouth 2 (two) times daily.     omeprazole 40 MG capsule  Commonly known as:  PRILOSEC  Take 40 mg by mouth daily.     simvastatin 40 MG tablet  Commonly known as:  ZOCOR  Take 40 mg by mouth at bedtime.     VITAMIN D PO  Take 1 tablet by mouth every morning.     zolpidem 10 MG tablet  Commonly known as:  AMBIEN  Take 5 mg by mouth at bedtime. For sleep       Allergies  Allergen Reactions  . Penicillins       The results of significant diagnostics from this hospitalization (including imaging, microbiology, ancillary and laboratory) are listed below for reference.    Significant Diagnostic Studies: Dg Chest 1 View  11/02/2013   CLINICAL DATA:  Fall.  EXAM: CHEST - 1 VIEW  COMPARISON:  Chest radiograph May 05, 2013  FINDINGS: The cardiac  silhouette appears mildly enlarged, similar. Mediastinal silhouette is nonsuspicious, mildly calcified aortic knob. Diffuse mild interstitial prominence, slightly increasing. Similar nodularity right midlung zone, postoperative change the right lung apex is similar pleural thickening. No pneumothorax. Soft tissue planes and included osseous structures are nonsuspicious.  IMPRESSION: Stable cardiomegaly, increasing interstitial prominence may reflect pulmonary edema. Similar nodularity/calcified pleural plaque and postoperative changes of right midlung zone.   Electronically Signed   By: Elon Alas   On: 11/02/2013 01:22   Dg Shoulder Right  11/02/2013   CLINICAL DATA:  Fall.  EXAM: RIGHT SHOULDER - 2+ VIEW  COMPARISON:  None.  FINDINGS: There is no evidence of fracture or dislocation. Mild acromioclavicular undersurface spurring. There is no evidence of arthropathy or other focal bone abnormality. Soft tissues are unremarkable. Included view of the chest demonstrates postsurgical change and nodularity.  IMPRESSION: No acute fracture deformity or dislocation.   Electronically Signed   By: Elon Alas   On: 11/02/2013 01:21   Dg Hip Complete Right  11/02/2013   CLINICAL DATA:  Right hip pain after a fall.  EXAM: RIGHT HIP - COMPLETE 2+ VIEW  COMPARISON:  None.  FINDINGS: Acute comminuted inter trochanteric fracture of the right hip with varus angulation and impaction. No dislocation of the hip joint. Degenerative changes are present in the lower lumbar spine and both hips. Pelvis, SI joints, and symphysis pubis appear intact.  IMPRESSION: Comminuted intertrochanteric fractures of the right hip.   Electronically Signed   By: Lucienne Capers M.D.   On: 11/02/2013 01:20   Dg Hip Operative Right  11/04/2013   CLINICAL DATA:  Right hip fracture  EXAM: DG OPERATIVE RIGHT HIP  TECHNIQUE: A single spot fluoroscopic AP image of the right hip is submitted.  COMPARISON:  11/02/2013  FINDINGS: Proximal  right femoral fracture is again noted. A proximal medullary rod with compression screw is now in place. The fracture fragments with the exception of the lesser trochanter are are in near anatomic alignment. 1 min and 18 seconds of fluoroscopy was utilized.   Electronically Signed   By: Inez Catalina M.D.   On: 11/04/2013 14:21   Dg Femur Right  11/02/2013   CLINICAL DATA:  Fall.  EXAM: RIGHT FEMUR - 2 VIEW  COMPARISON:  Pelvic radiograph November 01, 2013.  FINDINGS: Comminuted impacted intertrochanteric fracture with varus angulation distal bony fragments. In addition, suspected nondisplaced femoral neck fracture. No dislocation. No destructive bony lesions. Soft tissue planes are nonsuspicious.  IMPRESSION: Comminuted impacted mildly angulated right femur intertrochanteric fracture. In addition, suspected nondisplaced right femoral neck fracture. No dislocation.   Electronically Signed   By: Elon Alas   On: 11/02/2013 01:24   Ct Head Wo Contrast  11/02/2013   CLINICAL DATA:  [place] in the bathroom. Dizziness, weakness, right hip pain.  EXAM: CT HEAD WITHOUT CONTRAST  TECHNIQUE: Contiguous axial images were obtained from the base of the skull through the vertex without intravenous contrast.  COMPARISON:  11/20/2003  FINDINGS: Diffuse cerebral atrophy. No ventricular dilatation. Low-attenuation changes in the deep white matter consistent with small vessel ischemia. Old lacune or infarct in the left thalamus. No mass effect or midline shift. No abnormal extra-axial fluid collections. Gray-white matter junctions are distinct. Basal cisterns are not effaced. No evidence of acute intracranial hemorrhage. No depressed skull fractures. Visualized paranasal sinuses and mastoid air cells are not opacified.  IMPRESSION: No acute intracranial abnormalities. Chronic atrophy and small vessel ischemic changes.   Electronically Signed   By: Lucienne Capers M.D.   On: 11/02/2013 00:44   Ct Hip Right Wo  Contrast  11/02/2013   CLINICAL DATA:  Right hip fracture, status post fall. Nonspecific (abnormal) findings on radiological and other examination of musculoskeletal sysem.  EXAM: CT OF THE RIGHT HIP WITHOUT CONTRAST; 3-DIMENSIONAL CT IMAGE RENDERING ON ACQUISITION WORKSTATION  TECHNIQUE: Multidetector CT imaging was performed according to the standard protocol. Multiplanar CT image reconstructions were also generated.; 3-dimensional CT images were rendered by post-processing of the original CT data on an acquisition workstation. The 3-dimensional CT images were interpreted and findings were reported in the  accompanying complete CT report for this study  COMPARISON:  None.  FINDINGS: There is a comminuted fracture of the proximal right femur. There is a comminuted right intertrochanteric fracture with medial displacement of the lesser trochanter. There is mildly displaced femoral neck fracture. There is no dislocation. There is no acetabular fracture. There is a nondisplaced fracture of the right inferior pubic ramus.  There is no lytic or blastic osseous lesion.  There is no soft tissue hematoma, fluid collection or soft tissue mass. The muscles are normal. Mild soft tissue contusion in the subcutaneous fat overlying the greater trochanter.  IMPRESSION: 1. Comminuted fracture of the proximal right femur. Comminuted right intertrochanteric fracture with medial displacement of the lesser trochanter. Mildly displaced femoral neck fracture. No dislocation. 2. Nondisplaced right inferior pubic ramus fracture.   Electronically Signed   By: Kathreen Devoid   On: 11/02/2013 12:12   Ct 3d Recon At Scanner  11/02/2013   CLINICAL DATA:  Right hip fracture, status post fall. Nonspecific (abnormal) findings on radiological and other examination of musculoskeletal sysem.  EXAM: CT OF THE RIGHT HIP WITHOUT CONTRAST; 3-DIMENSIONAL CT IMAGE RENDERING ON ACQUISITION WORKSTATION  TECHNIQUE: Multidetector CT imaging was performed  according to the standard protocol. Multiplanar CT image reconstructions were also generated.; 3-dimensional CT images were rendered by post-processing of the original CT data on an acquisition workstation. The 3-dimensional CT images were interpreted and findings were reported in the accompanying complete CT report for this study  COMPARISON:  None.  FINDINGS: There is a comminuted fracture of the proximal right femur. There is a comminuted right intertrochanteric fracture with medial displacement of the lesser trochanter. There is mildly displaced femoral neck fracture. There is no dislocation. There is no acetabular fracture. There is a nondisplaced fracture of the right inferior pubic ramus.  There is no lytic or blastic osseous lesion.  There is no soft tissue hematoma, fluid collection or soft tissue mass. The muscles are normal. Mild soft tissue contusion in the subcutaneous fat overlying the greater trochanter.  IMPRESSION: 1. Comminuted fracture of the proximal right femur. Comminuted right intertrochanteric fracture with medial displacement of the lesser trochanter. Mildly displaced femoral neck fracture. No dislocation. 2. Nondisplaced right inferior pubic ramus fracture.   Electronically Signed   By: Kathreen Devoid   On: 11/02/2013 12:12    Microbiology: Recent Results (from the past 240 hour(s))  URINE CULTURE     Status: None   Collection Time    11/02/13  2:28 AM      Result Value Ref Range Status   Specimen Description URINE, CATHETERIZED   Final   Special Requests NONE   Final   Culture  Setup Time     Final   Value: 11/02/2013 13:44     Performed at [facility]     Final   Value: 75,000 COLONIES/ML     Performed at [facility]   Culture     Final   Value: ENTEROBACTER AEROGENES     DIPHTHEROIDS(CORYNEBACTERIUM SPECIES)     Note: Standardized susceptibility testing for this organism is not available.     Performed at [facility]   Report  Status 11/04/2013 FINAL   Final   Organism ID, Bacteria ENTEROBACTER AEROGENES   Final  MRSA PCR SCREENING     Status: None   Collection Time    11/04/13 12:53 AM      Result Value Ref Range Status   MRSA  by PCR NEGATIVE  NEGATIVE Final   Comment:            The GeneXpert MRSA Assay (FDA     approved for NASAL specimens     only), is one component of a     comprehensive MRSA colonization     surveillance program. It is not     intended to diagnose MRSA     infection nor to guide or     monitor treatment for     MRSA infections.     Labs: Basic Metabolic Panel:  Recent Labs Lab 11/02/13 0121 11/03/13 0553 11/04/13 0623 11/05/13 0555 11/06/13 0617  NA 137 139 137 136* 137  K 3.8 4.0 3.9 4.2 4.0  CL 100 102 99 98 99  CO2 26 26 24 27 26   GLUCOSE 139* 125* 145* 153* 138*  BUN 10 10 11 12 15   CREATININE 0.77 0.71 0.55 0.58 0.59  CALCIUM 8.7 9.2 9.3 9.3 9.3   Liver Function Tests:  Recent Labs Lab 11/05/13 0555  AST 28  ALT 14  ALKPHOS 68  BILITOT 0.9  PROT 6.4  ALBUMIN 2.7*   No results found for this basename: LIPASE, AMYLASE,  in the last 168 hours No results found for this basename: AMMONIA,  in the last 168 hours CBC:  Recent Labs Lab 11/02/13 0121  11/03/13 0730 11/03/13 1904 11/04/13 0623 11/05/13 0555 11/06/13 0617  WBC 90.2*  < > DUPLICATE REQUEST 11.1* 14.4* 15.2* 13.4*  NEUTROABS 13.1*  --  PENDING  --  10.0* 10.6*  --   HGB 55.2*  < > DUPLICATE REQUEST 08.0 22.3 12.4 11.5*  HCT 36.1*  < > DUPLICATE REQUEST 22.4 49.7 36.9 34.5*  MCV 530.0*  < > DUPLICATE REQUEST 51.1 02.1 95.3 95.0  PLT 117  < > DUPLICATE REQUEST 356 701 224 249  < > = values in this interval not displayed. Cardiac Enzymes:  Recent Labs Lab 11/02/13 0121  TROPONINI <0.30   BNP: BNP (last 3 results) No results found for this basename: PROBNP,  in the last 8760 hours CBG:  Recent Labs Lab 11/05/13 1149 11/05/13 1636 11/05/13 2105 11/06/13 0732 11/06/13 1204   GLUCAP 160* 115* 147* 120* 154*       Signed:  Jaleil Renwick M  [facility] 11/06/2013, 2:08 PM

## 2013-11-09 DIAGNOSIS — E119 Type 2 diabetes mellitus without complications: Secondary | ICD-10-CM | POA: Diagnosis not present

## 2013-11-09 DIAGNOSIS — F068 Other specified mental disorders due to known physiological condition: Secondary | ICD-10-CM | POA: Diagnosis not present

## 2013-11-09 DIAGNOSIS — E559 Vitamin D deficiency, unspecified: Secondary | ICD-10-CM | POA: Diagnosis not present

## 2013-11-09 DIAGNOSIS — S72009A Fracture of unspecified part of neck of unspecified femur, initial encounter for closed fracture: Secondary | ICD-10-CM | POA: Diagnosis not present

## 2013-11-09 DIAGNOSIS — E8779 Other fluid overload: Secondary | ICD-10-CM | POA: Diagnosis not present

## 2013-11-09 DIAGNOSIS — W19XXXA Unspecified fall, initial encounter: Secondary | ICD-10-CM | POA: Diagnosis not present

## 2013-11-09 DIAGNOSIS — J449 Chronic obstructive pulmonary disease, unspecified: Secondary | ICD-10-CM | POA: Diagnosis not present

## 2013-11-09 NOTE — Progress Notes (Signed)
UR chart review completed.  

## 2013-11-10 DIAGNOSIS — R0989 Other specified symptoms and signs involving the circulatory and respiratory systems: Secondary | ICD-10-CM | POA: Diagnosis not present

## 2013-11-10 DIAGNOSIS — E559 Vitamin D deficiency, unspecified: Secondary | ICD-10-CM | POA: Diagnosis not present

## 2013-11-10 DIAGNOSIS — F068 Other specified mental disorders due to known physiological condition: Secondary | ICD-10-CM | POA: Diagnosis not present

## 2013-11-10 DIAGNOSIS — S72009A Fracture of unspecified part of neck of unspecified femur, initial encounter for closed fracture: Secondary | ICD-10-CM | POA: Diagnosis not present

## 2013-11-10 DIAGNOSIS — R05 Cough: Secondary | ICD-10-CM | POA: Diagnosis not present

## 2013-11-10 DIAGNOSIS — E119 Type 2 diabetes mellitus without complications: Secondary | ICD-10-CM | POA: Diagnosis not present

## 2013-11-10 DIAGNOSIS — W19XXXA Unspecified fall, initial encounter: Secondary | ICD-10-CM | POA: Diagnosis not present

## 2013-11-10 DIAGNOSIS — E8779 Other fluid overload: Secondary | ICD-10-CM | POA: Diagnosis not present

## 2013-11-10 DIAGNOSIS — J449 Chronic obstructive pulmonary disease, unspecified: Secondary | ICD-10-CM | POA: Diagnosis not present

## 2013-11-13 DIAGNOSIS — E8779 Other fluid overload: Secondary | ICD-10-CM | POA: Diagnosis not present

## 2013-11-20 DIAGNOSIS — J449 Chronic obstructive pulmonary disease, unspecified: Secondary | ICD-10-CM | POA: Diagnosis not present

## 2013-11-20 DIAGNOSIS — E46 Unspecified protein-calorie malnutrition: Secondary | ICD-10-CM | POA: Diagnosis not present

## 2013-11-20 DIAGNOSIS — N183 Chronic kidney disease, stage 3 unspecified: Secondary | ICD-10-CM | POA: Diagnosis not present

## 2013-11-20 DIAGNOSIS — D72829 Elevated white blood cell count, unspecified: Secondary | ICD-10-CM | POA: Diagnosis not present

## 2013-11-27 DIAGNOSIS — E46 Unspecified protein-calorie malnutrition: Secondary | ICD-10-CM | POA: Diagnosis not present

## 2013-11-27 DIAGNOSIS — D72829 Elevated white blood cell count, unspecified: Secondary | ICD-10-CM | POA: Diagnosis not present

## 2013-11-27 DIAGNOSIS — R109 Unspecified abdominal pain: Secondary | ICD-10-CM | POA: Diagnosis not present

## 2013-11-27 DIAGNOSIS — K5900 Constipation, unspecified: Secondary | ICD-10-CM | POA: Diagnosis not present

## 2013-11-27 DIAGNOSIS — N183 Chronic kidney disease, stage 3 unspecified: Secondary | ICD-10-CM | POA: Diagnosis not present

## 2013-12-02 DIAGNOSIS — N281 Cyst of kidney, acquired: Secondary | ICD-10-CM | POA: Diagnosis not present

## 2013-12-08 DIAGNOSIS — R1312 Dysphagia, oropharyngeal phase: Secondary | ICD-10-CM | POA: Diagnosis not present

## 2013-12-08 DIAGNOSIS — M349 Systemic sclerosis, unspecified: Secondary | ICD-10-CM | POA: Diagnosis not present

## 2013-12-08 DIAGNOSIS — A048 Other specified bacterial intestinal infections: Secondary | ICD-10-CM | POA: Diagnosis not present

## 2013-12-08 DIAGNOSIS — R197 Diarrhea, unspecified: Secondary | ICD-10-CM | POA: Diagnosis not present

## 2013-12-08 DIAGNOSIS — J449 Chronic obstructive pulmonary disease, unspecified: Secondary | ICD-10-CM | POA: Diagnosis not present

## 2013-12-08 DIAGNOSIS — Z9181 History of falling: Secondary | ICD-10-CM | POA: Diagnosis not present

## 2013-12-08 DIAGNOSIS — K2270 Barrett's esophagus without dysplasia: Secondary | ICD-10-CM | POA: Diagnosis not present

## 2013-12-08 DIAGNOSIS — D72829 Elevated white blood cell count, unspecified: Secondary | ICD-10-CM | POA: Diagnosis not present

## 2013-12-08 DIAGNOSIS — I7300 Raynaud's syndrome without gangrene: Secondary | ICD-10-CM | POA: Diagnosis not present

## 2013-12-08 DIAGNOSIS — M109 Gout, unspecified: Secondary | ICD-10-CM | POA: Diagnosis not present

## 2013-12-08 DIAGNOSIS — D649 Anemia, unspecified: Secondary | ICD-10-CM | POA: Diagnosis not present

## 2013-12-08 DIAGNOSIS — E119 Type 2 diabetes mellitus without complications: Secondary | ICD-10-CM | POA: Diagnosis not present

## 2013-12-08 DIAGNOSIS — R488 Other symbolic dysfunctions: Secondary | ICD-10-CM | POA: Diagnosis not present

## 2013-12-08 DIAGNOSIS — S72143A Displaced intertrochanteric fracture of unspecified femur, initial encounter for closed fracture: Secondary | ICD-10-CM | POA: Diagnosis not present

## 2013-12-08 DIAGNOSIS — F0390 Unspecified dementia without behavioral disturbance: Secondary | ICD-10-CM | POA: Diagnosis not present

## 2013-12-08 DIAGNOSIS — G4730 Sleep apnea, unspecified: Secondary | ICD-10-CM | POA: Diagnosis not present

## 2013-12-08 DIAGNOSIS — M6281 Muscle weakness (generalized): Secondary | ICD-10-CM | POA: Diagnosis not present

## 2013-12-08 DIAGNOSIS — F329 Major depressive disorder, single episode, unspecified: Secondary | ICD-10-CM | POA: Diagnosis not present

## 2013-12-08 DIAGNOSIS — R1310 Dysphagia, unspecified: Secondary | ICD-10-CM | POA: Diagnosis not present

## 2013-12-08 DIAGNOSIS — F411 Generalized anxiety disorder: Secondary | ICD-10-CM | POA: Diagnosis not present

## 2013-12-08 DIAGNOSIS — Z8673 Personal history of transient ischemic attack (TIA), and cerebral infarction without residual deficits: Secondary | ICD-10-CM | POA: Diagnosis not present

## 2013-12-08 DIAGNOSIS — G4700 Insomnia, unspecified: Secondary | ICD-10-CM | POA: Diagnosis not present

## 2013-12-08 DIAGNOSIS — I10 Essential (primary) hypertension: Secondary | ICD-10-CM | POA: Diagnosis not present

## 2013-12-08 DIAGNOSIS — E7800 Pure hypercholesterolemia, unspecified: Secondary | ICD-10-CM | POA: Diagnosis not present

## 2013-12-30 DIAGNOSIS — R1310 Dysphagia, unspecified: Secondary | ICD-10-CM | POA: Diagnosis not present

## 2013-12-30 DIAGNOSIS — F0390 Unspecified dementia without behavioral disturbance: Secondary | ICD-10-CM | POA: Diagnosis not present

## 2013-12-30 DIAGNOSIS — D649 Anemia, unspecified: Secondary | ICD-10-CM | POA: Diagnosis not present

## 2013-12-30 DIAGNOSIS — E119 Type 2 diabetes mellitus without complications: Secondary | ICD-10-CM | POA: Diagnosis not present

## 2013-12-30 DIAGNOSIS — S72141D Displaced intertrochanteric fracture of right femur, subsequent encounter for closed fracture with routine healing: Secondary | ICD-10-CM | POA: Diagnosis not present

## 2013-12-30 DIAGNOSIS — Z9181 History of falling: Secondary | ICD-10-CM | POA: Diagnosis not present

## 2013-12-30 DIAGNOSIS — I10 Essential (primary) hypertension: Secondary | ICD-10-CM | POA: Diagnosis not present

## 2014-01-01 DIAGNOSIS — F0390 Unspecified dementia without behavioral disturbance: Secondary | ICD-10-CM | POA: Diagnosis not present

## 2014-01-01 DIAGNOSIS — S72141D Displaced intertrochanteric fracture of right femur, subsequent encounter for closed fracture with routine healing: Secondary | ICD-10-CM | POA: Diagnosis not present

## 2014-01-01 DIAGNOSIS — E119 Type 2 diabetes mellitus without complications: Secondary | ICD-10-CM | POA: Diagnosis not present

## 2014-01-01 DIAGNOSIS — D649 Anemia, unspecified: Secondary | ICD-10-CM | POA: Diagnosis not present

## 2014-01-01 DIAGNOSIS — R1310 Dysphagia, unspecified: Secondary | ICD-10-CM | POA: Diagnosis not present

## 2014-01-01 DIAGNOSIS — I10 Essential (primary) hypertension: Secondary | ICD-10-CM | POA: Diagnosis not present

## 2014-01-04 DIAGNOSIS — Z681 Body mass index (BMI) 19 or less, adult: Secondary | ICD-10-CM | POA: Diagnosis not present

## 2014-01-04 DIAGNOSIS — I10 Essential (primary) hypertension: Secondary | ICD-10-CM | POA: Diagnosis not present

## 2014-01-04 DIAGNOSIS — Z23 Encounter for immunization: Secondary | ICD-10-CM | POA: Diagnosis not present

## 2014-01-04 DIAGNOSIS — E785 Hyperlipidemia, unspecified: Secondary | ICD-10-CM | POA: Diagnosis not present

## 2014-01-04 DIAGNOSIS — E119 Type 2 diabetes mellitus without complications: Secondary | ICD-10-CM | POA: Diagnosis not present

## 2014-01-04 DIAGNOSIS — M1990 Unspecified osteoarthritis, unspecified site: Secondary | ICD-10-CM | POA: Diagnosis not present

## 2014-01-05 DIAGNOSIS — E119 Type 2 diabetes mellitus without complications: Secondary | ICD-10-CM | POA: Diagnosis not present

## 2014-01-05 DIAGNOSIS — R1310 Dysphagia, unspecified: Secondary | ICD-10-CM | POA: Diagnosis not present

## 2014-01-05 DIAGNOSIS — S72141D Displaced intertrochanteric fracture of right femur, subsequent encounter for closed fracture with routine healing: Secondary | ICD-10-CM | POA: Diagnosis not present

## 2014-01-05 DIAGNOSIS — I10 Essential (primary) hypertension: Secondary | ICD-10-CM | POA: Diagnosis not present

## 2014-01-05 DIAGNOSIS — D649 Anemia, unspecified: Secondary | ICD-10-CM | POA: Diagnosis not present

## 2014-01-05 DIAGNOSIS — F0390 Unspecified dementia without behavioral disturbance: Secondary | ICD-10-CM | POA: Diagnosis not present

## 2014-01-06 DIAGNOSIS — D649 Anemia, unspecified: Secondary | ICD-10-CM | POA: Diagnosis not present

## 2014-01-06 DIAGNOSIS — E119 Type 2 diabetes mellitus without complications: Secondary | ICD-10-CM | POA: Diagnosis not present

## 2014-01-06 DIAGNOSIS — I10 Essential (primary) hypertension: Secondary | ICD-10-CM | POA: Diagnosis not present

## 2014-01-06 DIAGNOSIS — F0390 Unspecified dementia without behavioral disturbance: Secondary | ICD-10-CM | POA: Diagnosis not present

## 2014-01-06 DIAGNOSIS — S72141D Displaced intertrochanteric fracture of right femur, subsequent encounter for closed fracture with routine healing: Secondary | ICD-10-CM | POA: Diagnosis not present

## 2014-01-06 DIAGNOSIS — R1310 Dysphagia, unspecified: Secondary | ICD-10-CM | POA: Diagnosis not present

## 2014-01-07 DIAGNOSIS — I10 Essential (primary) hypertension: Secondary | ICD-10-CM | POA: Diagnosis not present

## 2014-01-07 DIAGNOSIS — E119 Type 2 diabetes mellitus without complications: Secondary | ICD-10-CM | POA: Diagnosis not present

## 2014-01-07 DIAGNOSIS — R1310 Dysphagia, unspecified: Secondary | ICD-10-CM | POA: Diagnosis not present

## 2014-01-07 DIAGNOSIS — D649 Anemia, unspecified: Secondary | ICD-10-CM | POA: Diagnosis not present

## 2014-01-07 DIAGNOSIS — S72141D Displaced intertrochanteric fracture of right femur, subsequent encounter for closed fracture with routine healing: Secondary | ICD-10-CM | POA: Diagnosis not present

## 2014-01-07 DIAGNOSIS — F0390 Unspecified dementia without behavioral disturbance: Secondary | ICD-10-CM | POA: Diagnosis not present

## 2014-01-08 DIAGNOSIS — E119 Type 2 diabetes mellitus without complications: Secondary | ICD-10-CM | POA: Diagnosis not present

## 2014-01-08 DIAGNOSIS — D649 Anemia, unspecified: Secondary | ICD-10-CM | POA: Diagnosis not present

## 2014-01-08 DIAGNOSIS — S72141D Displaced intertrochanteric fracture of right femur, subsequent encounter for closed fracture with routine healing: Secondary | ICD-10-CM | POA: Diagnosis not present

## 2014-01-08 DIAGNOSIS — F0390 Unspecified dementia without behavioral disturbance: Secondary | ICD-10-CM | POA: Diagnosis not present

## 2014-01-08 DIAGNOSIS — R1310 Dysphagia, unspecified: Secondary | ICD-10-CM | POA: Diagnosis not present

## 2014-01-08 DIAGNOSIS — I10 Essential (primary) hypertension: Secondary | ICD-10-CM | POA: Diagnosis not present

## 2014-01-11 DIAGNOSIS — D649 Anemia, unspecified: Secondary | ICD-10-CM | POA: Diagnosis not present

## 2014-01-11 DIAGNOSIS — E119 Type 2 diabetes mellitus without complications: Secondary | ICD-10-CM | POA: Diagnosis not present

## 2014-01-11 DIAGNOSIS — I10 Essential (primary) hypertension: Secondary | ICD-10-CM | POA: Diagnosis not present

## 2014-01-11 DIAGNOSIS — F0390 Unspecified dementia without behavioral disturbance: Secondary | ICD-10-CM | POA: Diagnosis not present

## 2014-01-11 DIAGNOSIS — S72141D Displaced intertrochanteric fracture of right femur, subsequent encounter for closed fracture with routine healing: Secondary | ICD-10-CM | POA: Diagnosis not present

## 2014-01-11 DIAGNOSIS — R1310 Dysphagia, unspecified: Secondary | ICD-10-CM | POA: Diagnosis not present

## 2014-01-12 DIAGNOSIS — S72141D Displaced intertrochanteric fracture of right femur, subsequent encounter for closed fracture with routine healing: Secondary | ICD-10-CM | POA: Diagnosis not present

## 2014-01-12 DIAGNOSIS — I10 Essential (primary) hypertension: Secondary | ICD-10-CM | POA: Diagnosis not present

## 2014-01-12 DIAGNOSIS — D649 Anemia, unspecified: Secondary | ICD-10-CM | POA: Diagnosis not present

## 2014-01-12 DIAGNOSIS — R1310 Dysphagia, unspecified: Secondary | ICD-10-CM | POA: Diagnosis not present

## 2014-01-12 DIAGNOSIS — E119 Type 2 diabetes mellitus without complications: Secondary | ICD-10-CM | POA: Diagnosis not present

## 2014-01-12 DIAGNOSIS — F0390 Unspecified dementia without behavioral disturbance: Secondary | ICD-10-CM | POA: Diagnosis not present

## 2014-01-13 DIAGNOSIS — R1310 Dysphagia, unspecified: Secondary | ICD-10-CM | POA: Diagnosis not present

## 2014-01-13 DIAGNOSIS — D649 Anemia, unspecified: Secondary | ICD-10-CM | POA: Diagnosis not present

## 2014-01-13 DIAGNOSIS — F0390 Unspecified dementia without behavioral disturbance: Secondary | ICD-10-CM | POA: Diagnosis not present

## 2014-01-13 DIAGNOSIS — N390 Urinary tract infection, site not specified: Secondary | ICD-10-CM | POA: Diagnosis not present

## 2014-01-13 DIAGNOSIS — S72141D Displaced intertrochanteric fracture of right femur, subsequent encounter for closed fracture with routine healing: Secondary | ICD-10-CM | POA: Diagnosis not present

## 2014-01-13 DIAGNOSIS — E119 Type 2 diabetes mellitus without complications: Secondary | ICD-10-CM | POA: Diagnosis not present

## 2014-01-13 DIAGNOSIS — I10 Essential (primary) hypertension: Secondary | ICD-10-CM | POA: Diagnosis not present

## 2014-01-14 DIAGNOSIS — F0390 Unspecified dementia without behavioral disturbance: Secondary | ICD-10-CM | POA: Diagnosis not present

## 2014-01-14 DIAGNOSIS — E119 Type 2 diabetes mellitus without complications: Secondary | ICD-10-CM | POA: Diagnosis not present

## 2014-01-14 DIAGNOSIS — I10 Essential (primary) hypertension: Secondary | ICD-10-CM | POA: Diagnosis not present

## 2014-01-14 DIAGNOSIS — D649 Anemia, unspecified: Secondary | ICD-10-CM | POA: Diagnosis not present

## 2014-01-14 DIAGNOSIS — S72141D Displaced intertrochanteric fracture of right femur, subsequent encounter for closed fracture with routine healing: Secondary | ICD-10-CM | POA: Diagnosis not present

## 2014-01-14 DIAGNOSIS — R1310 Dysphagia, unspecified: Secondary | ICD-10-CM | POA: Diagnosis not present

## 2014-01-15 DIAGNOSIS — R1310 Dysphagia, unspecified: Secondary | ICD-10-CM | POA: Diagnosis not present

## 2014-01-15 DIAGNOSIS — D649 Anemia, unspecified: Secondary | ICD-10-CM | POA: Diagnosis not present

## 2014-01-15 DIAGNOSIS — E119 Type 2 diabetes mellitus without complications: Secondary | ICD-10-CM | POA: Diagnosis not present

## 2014-01-15 DIAGNOSIS — I10 Essential (primary) hypertension: Secondary | ICD-10-CM | POA: Diagnosis not present

## 2014-01-15 DIAGNOSIS — S72141D Displaced intertrochanteric fracture of right femur, subsequent encounter for closed fracture with routine healing: Secondary | ICD-10-CM | POA: Diagnosis not present

## 2014-01-15 DIAGNOSIS — F0390 Unspecified dementia without behavioral disturbance: Secondary | ICD-10-CM | POA: Diagnosis not present

## 2014-01-20 DIAGNOSIS — D649 Anemia, unspecified: Secondary | ICD-10-CM | POA: Diagnosis not present

## 2014-01-20 DIAGNOSIS — R1310 Dysphagia, unspecified: Secondary | ICD-10-CM | POA: Diagnosis not present

## 2014-01-20 DIAGNOSIS — I10 Essential (primary) hypertension: Secondary | ICD-10-CM | POA: Diagnosis not present

## 2014-01-20 DIAGNOSIS — F0390 Unspecified dementia without behavioral disturbance: Secondary | ICD-10-CM | POA: Diagnosis not present

## 2014-01-20 DIAGNOSIS — E119 Type 2 diabetes mellitus without complications: Secondary | ICD-10-CM | POA: Diagnosis not present

## 2014-01-20 DIAGNOSIS — S72141D Displaced intertrochanteric fracture of right femur, subsequent encounter for closed fracture with routine healing: Secondary | ICD-10-CM | POA: Diagnosis not present

## 2014-01-21 DIAGNOSIS — I10 Essential (primary) hypertension: Secondary | ICD-10-CM | POA: Diagnosis not present

## 2014-01-21 DIAGNOSIS — S72141D Displaced intertrochanteric fracture of right femur, subsequent encounter for closed fracture with routine healing: Secondary | ICD-10-CM | POA: Diagnosis not present

## 2014-01-21 DIAGNOSIS — D649 Anemia, unspecified: Secondary | ICD-10-CM | POA: Diagnosis not present

## 2014-01-21 DIAGNOSIS — E119 Type 2 diabetes mellitus without complications: Secondary | ICD-10-CM | POA: Diagnosis not present

## 2014-01-21 DIAGNOSIS — R1310 Dysphagia, unspecified: Secondary | ICD-10-CM | POA: Diagnosis not present

## 2014-01-21 DIAGNOSIS — F0390 Unspecified dementia without behavioral disturbance: Secondary | ICD-10-CM | POA: Diagnosis not present

## 2014-01-22 ENCOUNTER — Other Ambulatory Visit: Payer: Self-pay

## 2014-01-22 DIAGNOSIS — N3281 Overactive bladder: Secondary | ICD-10-CM | POA: Diagnosis not present

## 2014-01-22 DIAGNOSIS — M179 Osteoarthritis of knee, unspecified: Secondary | ICD-10-CM | POA: Diagnosis not present

## 2014-01-22 DIAGNOSIS — Z6820 Body mass index (BMI) 20.0-20.9, adult: Secondary | ICD-10-CM | POA: Diagnosis not present

## 2014-01-22 DIAGNOSIS — R3919 Other difficulties with micturition: Secondary | ICD-10-CM | POA: Diagnosis not present

## 2014-01-22 DIAGNOSIS — E119 Type 2 diabetes mellitus without complications: Secondary | ICD-10-CM | POA: Diagnosis not present

## 2014-01-27 DIAGNOSIS — D649 Anemia, unspecified: Secondary | ICD-10-CM | POA: Diagnosis not present

## 2014-01-27 DIAGNOSIS — S72141D Displaced intertrochanteric fracture of right femur, subsequent encounter for closed fracture with routine healing: Secondary | ICD-10-CM | POA: Diagnosis not present

## 2014-01-27 DIAGNOSIS — F0390 Unspecified dementia without behavioral disturbance: Secondary | ICD-10-CM | POA: Diagnosis not present

## 2014-01-27 DIAGNOSIS — R1310 Dysphagia, unspecified: Secondary | ICD-10-CM | POA: Diagnosis not present

## 2014-01-27 DIAGNOSIS — E119 Type 2 diabetes mellitus without complications: Secondary | ICD-10-CM | POA: Diagnosis not present

## 2014-01-27 DIAGNOSIS — I10 Essential (primary) hypertension: Secondary | ICD-10-CM | POA: Diagnosis not present

## 2014-01-29 DIAGNOSIS — R1310 Dysphagia, unspecified: Secondary | ICD-10-CM | POA: Diagnosis not present

## 2014-01-29 DIAGNOSIS — F0390 Unspecified dementia without behavioral disturbance: Secondary | ICD-10-CM | POA: Diagnosis not present

## 2014-01-29 DIAGNOSIS — E119 Type 2 diabetes mellitus without complications: Secondary | ICD-10-CM | POA: Diagnosis not present

## 2014-01-29 DIAGNOSIS — D649 Anemia, unspecified: Secondary | ICD-10-CM | POA: Diagnosis not present

## 2014-01-29 DIAGNOSIS — I10 Essential (primary) hypertension: Secondary | ICD-10-CM | POA: Diagnosis not present

## 2014-01-29 DIAGNOSIS — S72141D Displaced intertrochanteric fracture of right femur, subsequent encounter for closed fracture with routine healing: Secondary | ICD-10-CM | POA: Diagnosis not present

## 2014-02-02 DIAGNOSIS — K5289 Other specified noninfective gastroenteritis and colitis: Secondary | ICD-10-CM | POA: Diagnosis not present

## 2014-02-02 DIAGNOSIS — N390 Urinary tract infection, site not specified: Secondary | ICD-10-CM | POA: Diagnosis not present

## 2014-02-02 DIAGNOSIS — Z6820 Body mass index (BMI) 20.0-20.9, adult: Secondary | ICD-10-CM | POA: Diagnosis not present

## 2014-02-18 DIAGNOSIS — D649 Anemia, unspecified: Secondary | ICD-10-CM | POA: Diagnosis not present

## 2014-02-18 DIAGNOSIS — S72141D Displaced intertrochanteric fracture of right femur, subsequent encounter for closed fracture with routine healing: Secondary | ICD-10-CM | POA: Diagnosis not present

## 2014-02-18 DIAGNOSIS — I10 Essential (primary) hypertension: Secondary | ICD-10-CM | POA: Diagnosis not present

## 2014-02-18 DIAGNOSIS — R1310 Dysphagia, unspecified: Secondary | ICD-10-CM | POA: Diagnosis not present

## 2014-02-18 DIAGNOSIS — F0390 Unspecified dementia without behavioral disturbance: Secondary | ICD-10-CM | POA: Diagnosis not present

## 2014-02-18 DIAGNOSIS — E119 Type 2 diabetes mellitus without complications: Secondary | ICD-10-CM | POA: Diagnosis not present

## 2014-02-25 DIAGNOSIS — I10 Essential (primary) hypertension: Secondary | ICD-10-CM | POA: Diagnosis not present

## 2014-02-25 DIAGNOSIS — Z6820 Body mass index (BMI) 20.0-20.9, adult: Secondary | ICD-10-CM | POA: Diagnosis not present

## 2014-02-25 DIAGNOSIS — R7301 Impaired fasting glucose: Secondary | ICD-10-CM | POA: Diagnosis not present

## 2014-02-25 DIAGNOSIS — E782 Mixed hyperlipidemia: Secondary | ICD-10-CM | POA: Diagnosis not present

## 2014-04-22 DIAGNOSIS — L820 Inflamed seborrheic keratosis: Secondary | ICD-10-CM | POA: Diagnosis not present

## 2014-04-22 DIAGNOSIS — D235 Other benign neoplasm of skin of trunk: Secondary | ICD-10-CM | POA: Diagnosis not present

## 2014-04-22 DIAGNOSIS — L821 Other seborrheic keratosis: Secondary | ICD-10-CM | POA: Diagnosis not present

## 2014-04-25 ENCOUNTER — Emergency Department (HOSPITAL_COMMUNITY)
Admission: EM | Admit: 2014-04-25 | Discharge: 2014-04-25 | Disposition: A | Discharge status: Home / Self Care | Payer: Medicare Other | Attending: Emergency Medicine | Admitting: Emergency Medicine

## 2014-04-25 ENCOUNTER — Encounter (HOSPITAL_COMMUNITY): Payer: Self-pay | Admitting: *Deleted

## 2014-04-25 ENCOUNTER — Emergency Department (HOSPITAL_COMMUNITY): Payer: Medicare Other

## 2014-04-25 DIAGNOSIS — Z87891 Personal history of nicotine dependence: Secondary | ICD-10-CM | POA: Diagnosis not present

## 2014-04-25 DIAGNOSIS — E119 Type 2 diabetes mellitus without complications: Secondary | ICD-10-CM | POA: Diagnosis not present

## 2014-04-25 DIAGNOSIS — M109 Gout, unspecified: Secondary | ICD-10-CM | POA: Diagnosis not present

## 2014-04-25 DIAGNOSIS — Z8673 Personal history of transient ischemic attack (TIA), and cerebral infarction without residual deficits: Secondary | ICD-10-CM | POA: Diagnosis not present

## 2014-04-25 DIAGNOSIS — I10 Essential (primary) hypertension: Secondary | ICD-10-CM | POA: Insufficient documentation

## 2014-04-25 DIAGNOSIS — Z880 Allergy status to penicillin: Secondary | ICD-10-CM | POA: Insufficient documentation

## 2014-04-25 DIAGNOSIS — R7309 Other abnormal glucose: Secondary | ICD-10-CM | POA: Diagnosis not present

## 2014-04-25 DIAGNOSIS — J441 Chronic obstructive pulmonary disease with (acute) exacerbation: Secondary | ICD-10-CM | POA: Diagnosis not present

## 2014-04-25 DIAGNOSIS — Z79899 Other long term (current) drug therapy: Secondary | ICD-10-CM | POA: Insufficient documentation

## 2014-04-25 DIAGNOSIS — J439 Emphysema, unspecified: Secondary | ICD-10-CM | POA: Diagnosis not present

## 2014-04-25 DIAGNOSIS — Z8669 Personal history of other diseases of the nervous system and sense organs: Secondary | ICD-10-CM | POA: Insufficient documentation

## 2014-04-25 DIAGNOSIS — Z8719 Personal history of other diseases of the digestive system: Secondary | ICD-10-CM | POA: Insufficient documentation

## 2014-04-25 DIAGNOSIS — R059 Cough, unspecified: Secondary | ICD-10-CM

## 2014-04-25 DIAGNOSIS — Z8619 Personal history of other infectious and parasitic diseases: Secondary | ICD-10-CM | POA: Insufficient documentation

## 2014-04-25 DIAGNOSIS — Z7982 Long term (current) use of aspirin: Secondary | ICD-10-CM | POA: Insufficient documentation

## 2014-04-25 DIAGNOSIS — R05 Cough: Secondary | ICD-10-CM | POA: Diagnosis not present

## 2014-04-25 DIAGNOSIS — R0602 Shortness of breath: Secondary | ICD-10-CM | POA: Diagnosis not present

## 2014-04-25 LAB — URINE MICROSCOPIC-ADD ON

## 2014-04-25 LAB — URINALYSIS, ROUTINE W REFLEX MICROSCOPIC
Bilirubin Urine: NEGATIVE
Glucose, UA: NEGATIVE mg/dL
HGB URINE DIPSTICK: NEGATIVE
Ketones, ur: NEGATIVE mg/dL
Nitrite: NEGATIVE
PH: 7 (ref 5.0–8.0)
Protein, ur: NEGATIVE mg/dL
Specific Gravity, Urine: 1.01 (ref 1.005–1.030)
UROBILINOGEN UA: 0.2 mg/dL (ref 0.0–1.0)

## 2014-04-25 LAB — CBG MONITORING, ED: Glucose-Capillary: 103 mg/dL — ABNORMAL HIGH (ref 70–99)

## 2014-04-25 NOTE — ED Notes (Signed)
Pt brought in by [facility] for c/o coughing spell; pt was eating dinner and got strangled on sprite; family told ems that pt had a episode of coughing, unable to cough up the liquid; pt's cbg was 78, pt given sucker to keep cbg elevated

## 2014-04-25 NOTE — ED Provider Notes (Signed)
CSN: [id number]     Arrival date & time 04/25/14  1930 History  This chart was scribed for Fredia Sorrow, MD by Lowella Petties, ED Scribe. The patient was seen in room APA05/APA05. Patient's care was started at 7:45 PM.    Chief Complaint  Patient presents with  . Cough   Patient is a 81 y.o. female presenting with cough. The history is provided by the patient. No language interpreter was used.  Cough Cough characteristics:  Dry Severity:  Severe Onset quality:  Sudden Duration:  2 hours Timing:  Rare Chronicity:  New Relieved by:  Nothing Worsened by:  Nothing tried Ineffective treatments:  None tried Associated symptoms: shortness of breath   Associated symptoms: no chest pain, no chills, no fever, no headaches, no rash, no rhinorrhea and no sore throat    HPI Comments: Tina [name] is a 81 y.o. female with a history of DM type 2 and systemic scleroderma who was brought by EMS to the Emergency Department after a coughing spell at home while eating dinner and choking on her food. She was drinking sprite with her supper when she choked. Her family told EMS that she was unable to cough up the liquid. She denies any additional medical problems.   PCP: Purvis Kilts, MD  Past Medical History  Diagnosis Date  . Lower abdominal pain   . Chronic diarrhea   . Barrett's esophagus   . Irritable bowel syndrome   . Helicobacter pylori (H. pylori)   . Gastritis   . Type 2 diabetes mellitus   . Essential hypertension, benign   . Sleep apnea     Not on CPAP  . 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 Scleroderma   . COPD (chronic obstructive pulmonary disease)   . Raynaud's disease   . Gout   . Stroke    Past Surgical History  Procedure Laterality Date  . Esophagogastroduodenoscopy  06/09  . Colonoscopy  12/2008  . Patella fracture surgery    . Esophagogastroduodenoscopy  02/02/05    EGD ED  . Esophagogastroduodenoscopy  03/08/00    EGD ED  . Cholecystectomy    . Lung surgery   2002    Lung collapsed  . Back surgery    . Intramedullary (im) nail intertrochanteric Right 11/04/2013    Procedure: INTERNAL FIXATION RIGHT HIP WITH GAMMA NAIL;  Surgeon: Carole Civil, MD;  Location: [facility];  Service: Orthopedics;  Laterality: Right;   Family History  Problem Relation Age of Onset  . Asthma Father   . Melanoma Brother    History  Substance Use Topics  . Smoking status: Former Smoker    Types: Cigarettes  . Smokeless tobacco: Not on file  . Alcohol Use: No   OB History    No data available     Review of Systems  Constitutional: Negative for fever and chills.  HENT: Positive for congestion. Negative for rhinorrhea and sore throat.   Eyes: Negative for visual disturbance.  Respiratory: Positive for cough, choking and shortness of breath.   Cardiovascular: Negative for chest pain and leg swelling.  Gastrointestinal: Negative for nausea, vomiting, abdominal pain and diarrhea.  Genitourinary: Negative for dysuria.  Musculoskeletal: Negative for back pain and neck pain.  Skin: Negative for rash.  Neurological: Negative for dizziness, light-headedness and headaches.  Hematological: Does not bruise/bleed easily.  Psychiatric/Behavioral: Negative for confusion.   Allergies  Penicillins  Home Medications   Prior to Admission medications   Medication Sig  Start Date End Date Taking? Authorizing Provider  allopurinol (ZYLOPRIM) 100 MG tablet Take 100 mg by mouth 2 (two) times daily.    Yes Historical Provider, MD  ALPRAZolam (XANAX) 0.25 MG tablet Take 1 tablet (0.25 mg total) by mouth 3 (three) times daily as needed for anxiety. 11/06/13  Yes Radene Gunning, NP  aspirin EC 81 MG tablet Take 81 mg by mouth daily.    Yes Historical Provider, MD  Cholecalciferol (VITAMIN D PO) Take 1 tablet by mouth every morning.   Yes Historical Provider, MD  citalopram (CELEXA) 20 MG tablet Take 20 mg by mouth at bedtime.    Yes Historical Provider, MD  diltiazem (CARTIA XT)  180 MG 24 hr capsule Take 180 mg by mouth 2 (two) times daily.   Yes Historical Provider, MD  donepezil (ARICEPT) 10 MG tablet Take 10 mg by mouth at bedtime.   Yes Historical Provider, MD  Ferrous Sulfate (IRON) 325 (65 FE) MG TABS Take 1 tablet by mouth daily.    Yes Historical Provider, MD  hydrochlorothiazide (HYDRODIURIL) 25 MG tablet Take 1 tablet (25 mg total) by mouth every morning. Resume on 11/13/13. 11/06/13  Yes Radene Gunning, NP  HYDROcodone-acetaminophen (NORCO) 5-325 MG per tablet Take 2 tablets by mouth every 4 (four) hours as needed. Patient taking differently: Take 0-1 tablets by mouth 3 (three) times daily. Takes 0.5 tablet in the morning, afternoon and 1 tablet at bedtime. 11/06/13  Yes Lezlie Octave Black, NP  lisinopril (PRINIVIL,ZESTRIL) 10 MG tablet Take 1 tablet (10 mg total) by mouth every morning. Resume 11/13/13 11/06/13  Yes Lezlie Octave Black, NP  metoprolol tartrate (LOPRESSOR) 25 MG tablet Take 12.5 mg by mouth 2 (two) times daily.    Yes Historical Provider, MD  omeprazole (PRILOSEC) 40 MG capsule Take 40 mg by mouth daily.   Yes Historical Provider, MD  simvastatin (ZOCOR) 40 MG tablet Take 40 mg by mouth at bedtime.     Yes Historical Provider, MD  tolterodine (DETROL) 1 MG tablet Take 1 tablet by mouth 2 (two) times daily. 04/21/14  Yes Historical Provider, MD  zolpidem (AMBIEN) 10 MG tablet Take 5 mg by mouth at bedtime. For sleep   Yes Historical Provider, MD  bisacodyl (DULCOLAX) 5 MG EC tablet Take 1 tablet (5 mg total) by mouth daily as needed for moderate constipation. Patient not taking: Reported on 04/25/2014 11/06/13   Radene Gunning, NP   Triage Vitals: BP 136/82 mmHg  Pulse 120  Temp(Src) 98.4 F (36.9 C) (Oral)  Resp 20  Ht 5' 7.5" (1.715 m)  Wt 130 lb (58.968 kg)  BMI 20.05 kg/m2  SpO2 98% Physical Exam  Constitutional: She is oriented to person, place, and time. She appears well-developed and well-nourished. No distress.  HENT:  Head: Normocephalic and  atraumatic.  Eyes: Conjunctivae and EOM are normal. Pupils are equal, round, and reactive to light. No scleral icterus.  Neck: Neck supple. No tracheal deviation present.  Cardiovascular: Normal rate, regular rhythm and normal heart sounds.   No murmur heard. Pulmonary/Chest: Effort normal and breath sounds normal. No respiratory distress. She has no wheezes. She has no rales.  Abdominal: Soft. Bowel sounds are normal. She exhibits no distension and no mass. There is no tenderness. There is no rebound and no guarding.  Musculoskeletal: Normal range of motion. She exhibits no edema.  Deformities to both hands due to scleroderma   Neurological: She is alert and oriented to person, place, and time.  Skin: Skin is warm and dry.  Psychiatric: She has a normal mood and affect. Her behavior is normal.  Nursing note and vitals reviewed.   ED Course  Procedures (including critical care time) DIAGNOSTIC STUDIES: Oxygen Saturation is 98% on room air, normal by my interpretation.    COORDINATION OF CARE: 7:50 PM-Discussed treatment plan which includes BS finger stick  with pt at bedside and pt agreed to plan.   Results for orders placed or performed during the hospital encounter of 04/25/14  Urinalysis, Routine w reflex microscopic  Result Value Ref Range   Color, Urine YELLOW YELLOW   APPearance CLEAR CLEAR   Specific Gravity, Urine 1.010 1.005 - 1.030   pH 7.0 5.0 - 8.0   Glucose, UA NEGATIVE NEGATIVE mg/dL   Hgb urine dipstick NEGATIVE NEGATIVE   Bilirubin Urine NEGATIVE NEGATIVE   Ketones, ur NEGATIVE NEGATIVE mg/dL   Protein, ur NEGATIVE NEGATIVE mg/dL   Urobilinogen, UA 0.2 0.0 - 1.0 mg/dL   Nitrite NEGATIVE NEGATIVE   Leukocytes, UA TRACE (A) NEGATIVE  Urine microscopic-add on  Result Value Ref Range   Squamous Epithelial / LPF RARE RARE   WBC, UA 0-2 <3 WBC/hpf   Bacteria, UA FEW (A) RARE  POC CBG, ED  Result Value Ref Range   Glucose-Capillary 103 (H) 70 - 99 mg/dL   Dg  Chest 2 View  04/25/2014   CLINICAL DATA:  Cough.  EXAM: CHEST  2 VIEW  COMPARISON:  CT chest 06/13/2006 and PA and lateral chest 03/26/2008.  FINDINGS: Postoperative change is again seen on the right. The lungs are emphysematous but clear. Heart size is normal. No pneumothorax or pleural effusion. Atherosclerotic vascular disease is noted.  IMPRESSION: Emphysema without acute disease.   Electronically Signed   By: Inge Rise M.D.   On: 04/25/2014 20:21     Medications - No data to display   EKG Interpretation None      MDM   Final diagnoses:  Coughing    Patient brought in by EMS patient had a choking and coughing spell at dinner the thought maybe she had aspirated some of her Sprite. Patient's oxygen saturations here have been above 95%. Chest x-rays negative. Patient's been asymptomatic here. Patient was also concerned about blood sugar being low was 78 at home here it's 103. Family raise concern about urinary tract infection urinalysis is negative. Patient is stable and safe for discharge home and follow-up with her doctor as needed.     I personally performed the services described in this documentation, which was scribed in my presence. The recorded information has been reviewed and is accurate.     Fredia Sorrow, MD 04/25/14 2105

## 2014-04-25 NOTE — Discharge Instructions (Signed)
Urinalysis negative no signs of urinary tract infection. Blood sugars are fine here. Chest x-ray negative. Return for any new or worse symptoms or follow-up with her doctor.

## 2014-04-25 NOTE — ED Notes (Signed)
Discharge instructions given, pt demonstrated teach back and verbal understanding. No concerns voiced.  

## 2014-06-08 ENCOUNTER — Ambulatory Visit (INDEPENDENT_AMBULATORY_CARE_PROVIDER_SITE_OTHER): Payer: Medicare Other | Admitting: Orthopedic Surgery

## 2014-06-08 ENCOUNTER — Ambulatory Visit (INDEPENDENT_AMBULATORY_CARE_PROVIDER_SITE_OTHER): Payer: Medicare Other

## 2014-06-08 ENCOUNTER — Other Ambulatory Visit: Payer: Self-pay | Admitting: *Deleted

## 2014-06-08 ENCOUNTER — Other Ambulatory Visit: Payer: Self-pay | Admitting: Orthopedic Surgery

## 2014-06-08 ENCOUNTER — Encounter: Payer: Self-pay | Admitting: Orthopedic Surgery

## 2014-06-08 VITALS — BP 82/44 | Ht 67.5 in | Wt 130.0 lb

## 2014-06-08 DIAGNOSIS — Z8781 Personal history of (healed) traumatic fracture: Secondary | ICD-10-CM

## 2014-06-08 DIAGNOSIS — Z967 Presence of other bone and tendon implants: Secondary | ICD-10-CM | POA: Diagnosis not present

## 2014-06-08 DIAGNOSIS — Z9889 Other specified postprocedural states: Principal | ICD-10-CM

## 2014-06-08 DIAGNOSIS — S72141D Displaced intertrochanteric fracture of right femur, subsequent encounter for closed fracture with routine healing: Secondary | ICD-10-CM | POA: Diagnosis not present

## 2014-06-08 MED ORDER — TRAMADOL HCL 50 MG PO TABS: 50.0000 mg | ORAL_TABLET | Freq: Four times a day (QID) | ORAL | Status: DC | PRN

## 2014-06-08 NOTE — Progress Notes (Signed)
Chief Complaint  Patient presents with  . Follow-up    hospital follow up Right hip, gamma nail, DOS 11/04/13    This patient has not been seen since July 2015?? Comes in today for x-ray of her right hip status post gamma nail fixation complaining of mild pain over the right hip relieved by tramadol  The patient is ambulatory with a walker  System review is negative  She does have some tenderness over the greater trochanter which is known to happen with this surgery she has excellent flexion of her hip pain with painless range of motion leg lengths are equal neurovascular exam is intact. Hip is stable. BP 82/44 mmHg  Ht 5' 7.5" (1.715 m)  Wt 130 lb (58.968 kg)  BMI 20.05 kg/m2 Patient remains alert awake slightly confused at times  X-rays show fracture healing implant well-seated  Impression status post peritrochanteric hip fracture status post gamma nailing with some mild greater trochanteric bursitis  Recommend tramadol follow-up as needed

## 2014-06-08 NOTE — Addendum Note (Signed)
Addended by: Baldomero Lamy B on: 06/08/2014 05:14 PM   Modules accepted: Orders, Medications

## 2014-06-12 DIAGNOSIS — F0390 Unspecified dementia without behavioral disturbance: Secondary | ICD-10-CM | POA: Diagnosis not present

## 2014-06-12 DIAGNOSIS — M25551 Pain in right hip: Secondary | ICD-10-CM | POA: Diagnosis not present

## 2014-06-12 DIAGNOSIS — M25511 Pain in right shoulder: Secondary | ICD-10-CM | POA: Diagnosis not present

## 2014-06-12 DIAGNOSIS — S72141D Displaced intertrochanteric fracture of right femur, subsequent encounter for closed fracture with routine healing: Secondary | ICD-10-CM | POA: Diagnosis not present

## 2014-06-14 DIAGNOSIS — M25511 Pain in right shoulder: Secondary | ICD-10-CM | POA: Diagnosis not present

## 2014-06-14 DIAGNOSIS — F0390 Unspecified dementia without behavioral disturbance: Secondary | ICD-10-CM | POA: Diagnosis not present

## 2014-06-14 DIAGNOSIS — S72141D Displaced intertrochanteric fracture of right femur, subsequent encounter for closed fracture with routine healing: Secondary | ICD-10-CM | POA: Diagnosis not present

## 2014-06-14 DIAGNOSIS — M25551 Pain in right hip: Secondary | ICD-10-CM | POA: Diagnosis not present

## 2014-06-16 DIAGNOSIS — S72141D Displaced intertrochanteric fracture of right femur, subsequent encounter for closed fracture with routine healing: Secondary | ICD-10-CM | POA: Diagnosis not present

## 2014-06-16 DIAGNOSIS — M25551 Pain in right hip: Secondary | ICD-10-CM | POA: Diagnosis not present

## 2014-06-16 DIAGNOSIS — F0390 Unspecified dementia without behavioral disturbance: Secondary | ICD-10-CM | POA: Diagnosis not present

## 2014-06-16 DIAGNOSIS — M25511 Pain in right shoulder: Secondary | ICD-10-CM | POA: Diagnosis not present

## 2014-06-18 DIAGNOSIS — M25511 Pain in right shoulder: Secondary | ICD-10-CM | POA: Diagnosis not present

## 2014-06-18 DIAGNOSIS — F0390 Unspecified dementia without behavioral disturbance: Secondary | ICD-10-CM | POA: Diagnosis not present

## 2014-06-18 DIAGNOSIS — S72141D Displaced intertrochanteric fracture of right femur, subsequent encounter for closed fracture with routine healing: Secondary | ICD-10-CM | POA: Diagnosis not present

## 2014-06-18 DIAGNOSIS — M25551 Pain in right hip: Secondary | ICD-10-CM | POA: Diagnosis not present

## 2014-06-23 ENCOUNTER — Ambulatory Visit (HOSPITAL_COMMUNITY)
Admission: RE | Admit: 2014-06-23 | Discharge: 2014-06-23 | Disposition: A | Discharge status: Home / Self Care | Payer: Medicare Other | Source: Ambulatory Visit | Attending: Family Medicine | Admitting: Family Medicine

## 2014-06-23 ENCOUNTER — Other Ambulatory Visit (HOSPITAL_COMMUNITY): Payer: Self-pay | Admitting: Family Medicine

## 2014-06-23 DIAGNOSIS — J209 Acute bronchitis, unspecified: Secondary | ICD-10-CM | POA: Diagnosis not present

## 2014-06-23 DIAGNOSIS — E782 Mixed hyperlipidemia: Secondary | ICD-10-CM | POA: Diagnosis not present

## 2014-06-23 DIAGNOSIS — Z6820 Body mass index (BMI) 20.0-20.9, adult: Secondary | ICD-10-CM | POA: Diagnosis not present

## 2014-06-23 DIAGNOSIS — I10 Essential (primary) hypertension: Secondary | ICD-10-CM | POA: Diagnosis not present

## 2014-06-23 DIAGNOSIS — R05 Cough: Secondary | ICD-10-CM | POA: Insufficient documentation

## 2014-06-23 DIAGNOSIS — M349 Systemic sclerosis, unspecified: Secondary | ICD-10-CM | POA: Diagnosis not present

## 2014-06-23 DIAGNOSIS — R509 Fever, unspecified: Secondary | ICD-10-CM | POA: Diagnosis not present

## 2014-06-23 DIAGNOSIS — I2510 Atherosclerotic heart disease of native coronary artery without angina pectoris: Secondary | ICD-10-CM | POA: Diagnosis not present

## 2014-06-28 DIAGNOSIS — M25511 Pain in right shoulder: Secondary | ICD-10-CM | POA: Diagnosis not present

## 2014-06-28 DIAGNOSIS — S72141D Displaced intertrochanteric fracture of right femur, subsequent encounter for closed fracture with routine healing: Secondary | ICD-10-CM | POA: Diagnosis not present

## 2014-06-28 DIAGNOSIS — F0390 Unspecified dementia without behavioral disturbance: Secondary | ICD-10-CM | POA: Diagnosis not present

## 2014-06-28 DIAGNOSIS — M25551 Pain in right hip: Secondary | ICD-10-CM | POA: Diagnosis not present

## 2014-06-30 DIAGNOSIS — F0390 Unspecified dementia without behavioral disturbance: Secondary | ICD-10-CM | POA: Diagnosis not present

## 2014-06-30 DIAGNOSIS — M25551 Pain in right hip: Secondary | ICD-10-CM | POA: Diagnosis not present

## 2014-06-30 DIAGNOSIS — S72141D Displaced intertrochanteric fracture of right femur, subsequent encounter for closed fracture with routine healing: Secondary | ICD-10-CM | POA: Diagnosis not present

## 2014-06-30 DIAGNOSIS — M25511 Pain in right shoulder: Secondary | ICD-10-CM | POA: Diagnosis not present

## 2014-07-05 DIAGNOSIS — F0390 Unspecified dementia without behavioral disturbance: Secondary | ICD-10-CM | POA: Diagnosis not present

## 2014-07-05 DIAGNOSIS — M25551 Pain in right hip: Secondary | ICD-10-CM | POA: Diagnosis not present

## 2014-07-05 DIAGNOSIS — S72141D Displaced intertrochanteric fracture of right femur, subsequent encounter for closed fracture with routine healing: Secondary | ICD-10-CM | POA: Diagnosis not present

## 2014-07-05 DIAGNOSIS — M25511 Pain in right shoulder: Secondary | ICD-10-CM | POA: Diagnosis not present

## 2014-07-07 DIAGNOSIS — M25551 Pain in right hip: Secondary | ICD-10-CM | POA: Diagnosis not present

## 2014-07-07 DIAGNOSIS — F0390 Unspecified dementia without behavioral disturbance: Secondary | ICD-10-CM | POA: Diagnosis not present

## 2014-07-07 DIAGNOSIS — M25511 Pain in right shoulder: Secondary | ICD-10-CM | POA: Diagnosis not present

## 2014-07-07 DIAGNOSIS — S72141D Displaced intertrochanteric fracture of right femur, subsequent encounter for closed fracture with routine healing: Secondary | ICD-10-CM | POA: Diagnosis not present

## 2014-07-08 DIAGNOSIS — M25551 Pain in right hip: Secondary | ICD-10-CM | POA: Diagnosis not present

## 2014-07-08 DIAGNOSIS — F0390 Unspecified dementia without behavioral disturbance: Secondary | ICD-10-CM | POA: Diagnosis not present

## 2014-07-08 DIAGNOSIS — S72141D Displaced intertrochanteric fracture of right femur, subsequent encounter for closed fracture with routine healing: Secondary | ICD-10-CM | POA: Diagnosis not present

## 2014-07-08 DIAGNOSIS — M25511 Pain in right shoulder: Secondary | ICD-10-CM | POA: Diagnosis not present

## 2014-07-09 DIAGNOSIS — F0390 Unspecified dementia without behavioral disturbance: Secondary | ICD-10-CM | POA: Diagnosis not present

## 2014-07-09 DIAGNOSIS — M25551 Pain in right hip: Secondary | ICD-10-CM | POA: Diagnosis not present

## 2014-07-09 DIAGNOSIS — M25511 Pain in right shoulder: Secondary | ICD-10-CM | POA: Diagnosis not present

## 2014-07-09 DIAGNOSIS — S72141D Displaced intertrochanteric fracture of right femur, subsequent encounter for closed fracture with routine healing: Secondary | ICD-10-CM | POA: Diagnosis not present

## 2014-10-04 ENCOUNTER — Other Ambulatory Visit: Payer: Self-pay

## 2014-10-07 ENCOUNTER — Other Ambulatory Visit: Payer: Self-pay | Admitting: Orthopedic Surgery

## 2014-10-14 ENCOUNTER — Other Ambulatory Visit: Payer: Self-pay | Admitting: *Deleted

## 2014-10-14 MED ORDER — TRAMADOL HCL 50 MG PO TABS: 50.0000 mg | ORAL_TABLET | Freq: Four times a day (QID) | ORAL | Status: DC | PRN

## 2015-01-04 DIAGNOSIS — Z23 Encounter for immunization: Secondary | ICD-10-CM | POA: Diagnosis not present

## 2015-01-13 ENCOUNTER — Other Ambulatory Visit: Payer: Self-pay | Admitting: Orthopedic Surgery

## 2015-01-17 ENCOUNTER — Other Ambulatory Visit: Payer: Self-pay | Admitting: *Deleted

## 2015-01-17 DIAGNOSIS — E782 Mixed hyperlipidemia: Secondary | ICD-10-CM | POA: Diagnosis not present

## 2015-01-17 DIAGNOSIS — J449 Chronic obstructive pulmonary disease, unspecified: Secondary | ICD-10-CM | POA: Diagnosis not present

## 2015-01-17 DIAGNOSIS — Z8673 Personal history of transient ischemic attack (TIA), and cerebral infarction without residual deficits: Secondary | ICD-10-CM | POA: Diagnosis not present

## 2015-01-17 DIAGNOSIS — M349 Systemic sclerosis, unspecified: Secondary | ICD-10-CM | POA: Diagnosis not present

## 2015-01-17 DIAGNOSIS — G309 Alzheimer's disease, unspecified: Secondary | ICD-10-CM | POA: Diagnosis not present

## 2015-01-17 DIAGNOSIS — F419 Anxiety disorder, unspecified: Secondary | ICD-10-CM | POA: Diagnosis not present

## 2015-01-17 DIAGNOSIS — N390 Urinary tract infection, site not specified: Secondary | ICD-10-CM | POA: Diagnosis not present

## 2015-01-17 DIAGNOSIS — Z1389 Encounter for screening for other disorder: Secondary | ICD-10-CM | POA: Diagnosis not present

## 2015-01-17 DIAGNOSIS — I10 Essential (primary) hypertension: Secondary | ICD-10-CM | POA: Diagnosis not present

## 2015-01-17 MED ORDER — TRAMADOL HCL 50 MG PO TABS: 50.0000 mg | ORAL_TABLET | Freq: Four times a day (QID) | ORAL | Status: DC | PRN

## 2015-03-02 DIAGNOSIS — N390 Urinary tract infection, site not specified: Secondary | ICD-10-CM | POA: Diagnosis not present

## 2015-04-18 DIAGNOSIS — R300 Dysuria: Secondary | ICD-10-CM | POA: Diagnosis not present

## 2015-04-18 DIAGNOSIS — E782 Mixed hyperlipidemia: Secondary | ICD-10-CM | POA: Diagnosis not present

## 2015-04-18 DIAGNOSIS — J209 Acute bronchitis, unspecified: Secondary | ICD-10-CM | POA: Diagnosis not present

## 2015-04-18 DIAGNOSIS — Z6824 Body mass index (BMI) 24.0-24.9, adult: Secondary | ICD-10-CM | POA: Diagnosis not present

## 2015-04-18 DIAGNOSIS — Z1389 Encounter for screening for other disorder: Secondary | ICD-10-CM | POA: Diagnosis not present

## 2015-04-18 DIAGNOSIS — I10 Essential (primary) hypertension: Secondary | ICD-10-CM | POA: Diagnosis not present

## 2015-07-13 DIAGNOSIS — Z6826 Body mass index (BMI) 26.0-26.9, adult: Secondary | ICD-10-CM | POA: Diagnosis not present

## 2015-07-13 DIAGNOSIS — R7309 Other abnormal glucose: Secondary | ICD-10-CM | POA: Diagnosis not present

## 2015-07-13 DIAGNOSIS — Z1389 Encounter for screening for other disorder: Secondary | ICD-10-CM | POA: Diagnosis not present

## 2015-07-13 DIAGNOSIS — E782 Mixed hyperlipidemia: Secondary | ICD-10-CM | POA: Diagnosis not present

## 2015-07-13 DIAGNOSIS — R39198 Other difficulties with micturition: Secondary | ICD-10-CM | POA: Diagnosis not present

## 2015-07-13 DIAGNOSIS — J441 Chronic obstructive pulmonary disease with (acute) exacerbation: Secondary | ICD-10-CM | POA: Diagnosis not present

## 2015-07-13 DIAGNOSIS — M349 Systemic sclerosis, unspecified: Secondary | ICD-10-CM | POA: Diagnosis not present

## 2015-07-31 ENCOUNTER — Other Ambulatory Visit: Payer: Self-pay | Admitting: Orthopedic Surgery

## 2015-08-03 ENCOUNTER — Other Ambulatory Visit: Payer: Self-pay | Admitting: *Deleted

## 2015-08-04 ENCOUNTER — Other Ambulatory Visit: Payer: Self-pay | Admitting: *Deleted

## 2015-08-04 MED ORDER — TRAMADOL HCL 50 MG PO TABS
50.0000 mg | ORAL_TABLET | Freq: Four times a day (QID) | ORAL | Status: DC | PRN
Start: 2015-08-04 — End: 2015-10-27

## 2015-08-30 ENCOUNTER — Emergency Department (HOSPITAL_COMMUNITY)
Admission: EM | Admit: 2015-08-30 | Discharge: 2015-08-30 | Disposition: A | Discharge status: Home / Self Care | Payer: Medicare Other | Attending: Emergency Medicine | Admitting: Emergency Medicine

## 2015-08-30 ENCOUNTER — Encounter (HOSPITAL_COMMUNITY): Payer: Self-pay

## 2015-08-30 DIAGNOSIS — J449 Chronic obstructive pulmonary disease, unspecified: Secondary | ICD-10-CM | POA: Insufficient documentation

## 2015-08-30 DIAGNOSIS — Z87891 Personal history of nicotine dependence: Secondary | ICD-10-CM | POA: Insufficient documentation

## 2015-08-30 DIAGNOSIS — Z7982 Long term (current) use of aspirin: Secondary | ICD-10-CM | POA: Diagnosis not present

## 2015-08-30 DIAGNOSIS — I10 Essential (primary) hypertension: Secondary | ICD-10-CM | POA: Diagnosis not present

## 2015-08-30 DIAGNOSIS — Z79899 Other long term (current) drug therapy: Secondary | ICD-10-CM | POA: Insufficient documentation

## 2015-08-30 DIAGNOSIS — R110 Nausea: Secondary | ICD-10-CM | POA: Insufficient documentation

## 2015-08-30 DIAGNOSIS — Z8673 Personal history of transient ischemic attack (TIA), and cerebral infarction without residual deficits: Secondary | ICD-10-CM | POA: Insufficient documentation

## 2015-08-30 DIAGNOSIS — E119 Type 2 diabetes mellitus without complications: Secondary | ICD-10-CM | POA: Diagnosis not present

## 2015-08-30 DIAGNOSIS — R531 Weakness: Secondary | ICD-10-CM | POA: Diagnosis not present

## 2015-08-30 DIAGNOSIS — R404 Transient alteration of awareness: Secondary | ICD-10-CM | POA: Diagnosis not present

## 2015-08-30 LAB — COMPREHENSIVE METABOLIC PANEL
ALBUMIN: 3.8 g/dL (ref 3.5–5.0)
ALK PHOS: 82 U/L (ref 38–126)
ALT: 12 U/L — AB (ref 14–54)
ANION GAP: 10 (ref 5–15)
AST: 21 U/L (ref 15–41)
BILIRUBIN TOTAL: 0.7 mg/dL (ref 0.3–1.2)
BUN: 8 mg/dL (ref 6–20)
CALCIUM: 9.4 mg/dL (ref 8.9–10.3)
CO2: 27 mmol/L (ref 22–32)
Chloride: 102 mmol/L (ref 101–111)
Creatinine, Ser: 0.77 mg/dL (ref 0.44–1.00)
GFR calc Af Amer: >60 mL/min (ref >60)
GFR calc non Af Amer: >60 mL/min (ref >60)
GLUCOSE: 201 mg/dL — AB (ref 65–99)
Potassium: 3.8 mmol/L (ref 3.5–5.1)
SODIUM: 139 mmol/L (ref 135–145)
TOTAL PROTEIN: 7.6 g/dL (ref 6.5–8.1)

## 2015-08-30 LAB — URINALYSIS, ROUTINE W REFLEX MICROSCOPIC
Bilirubin Urine: NEGATIVE
Glucose, UA: NEGATIVE mg/dL
HGB URINE DIPSTICK: NEGATIVE
Ketones, ur: NEGATIVE mg/dL
Leukocytes, UA: NEGATIVE
Nitrite: NEGATIVE
PH: 6.5 (ref 5.0–8.0)
Protein, ur: NEGATIVE mg/dL

## 2015-08-30 LAB — CBC
HCT: 42 % (ref 36.0–46.0)
Hemoglobin: 13.6 g/dL (ref 12.0–15.0)
MCH: 32.5 pg (ref 26.0–34.0)
MCHC: 32.4 g/dL (ref 30.0–36.0)
MCV: 100.5 fL — ABNORMAL HIGH (ref 78.0–100.0)
PLATELETS: 344 10*3/uL (ref 150–400)
RBC: 4.18 MIL/uL (ref 3.87–5.11)
RDW: 14.7 % (ref 11.5–15.5)
WBC: 9 10*3/uL (ref 4.0–10.5)

## 2015-08-30 LAB — TROPONIN I: Troponin I: <0.03 ng/mL (ref <0.031)

## 2015-08-30 LAB — CBG MONITORING, ED: GLUCOSE-CAPILLARY: 153 mg/dL — AB (ref 65–99)

## 2015-08-30 LAB — LIPASE, BLOOD: Lipase: 34 U/L (ref 11–51)

## 2015-08-30 MED ORDER — ONDANSETRON HCL 4 MG/2ML IJ SOLN
4.0000 mg | Freq: Once | INTRAMUSCULAR | Status: AC
  Administered 2015-08-30: 4 mg via INTRAVENOUS
  Filled 2015-08-30: qty 2

## 2015-08-30 NOTE — ED Notes (Signed)
Complain of nausea that started about an hour ago

## 2015-08-30 NOTE — ED Provider Notes (Signed)
CSN: [id number]     Arrival date & time 08/30/15  0907 History  By signing my name below, I, Tina [name], attest that this documentation has been prepared under the direction and in the presence of Tina Rank, MD. Electronically Signed: Judithann [name], ED Scribe. 08/30/2015. 9:34 AM.      Chief Complaint  Patient presents with  . Nausea   Patient is a 82 y.o. female presenting with general illness. The history is provided by the patient. No language interpreter was used.  Illness Severity:  Moderate Onset quality:  Sudden Duration:  1 hour Timing:  Constant Progression:  Worsening Chronicity:  New Relieved by:  Nothing tried Worsened by:  Nothing tried Ineffective treatments:  Nothing tried Associated symptoms: nausea   Associated symptoms: no abdominal pain, no chest pain, no diarrhea, no fever, no headaches, no shortness of breath and no vomiting    HPI Comments: Tina [name] is a 82 y.o. female with a hx of IBS, Barrett's esophagus, and DM who presents to the Emergency Department complaining of gradually worsening moderate nausea onset this am when she woke up. She explains that she felt fine prior to going to bed. No alleviating factors noted. Pt has not tried any medication PTA. She denies any trouble with nausea in dealing with her IBS and Barrett's esophagus. She denies any vomiting, diarrhea, fever, cough, CP, SOB, leg swelling, abdominal pain, or HA. No other complaints at this time.    Past Medical History  Diagnosis Date  . Lower abdominal pain   . Chronic diarrhea   . Barrett's esophagus   . Irritable bowel syndrome   . Helicobacter pylori (H. pylori)   . Gastritis   . Type 2 diabetes mellitus ([place])   . Essential hypertension, benign   . Sleep apnea     Not on CPAP  . 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 Scleroderma ([place])   . COPD (chronic obstructive pulmonary disease) ([place])   . Raynaud's disease   . Gout   . Stroke [facility])    Past Surgical History  Procedure  Laterality Date  . Esophagogastroduodenoscopy  06/09  . Colonoscopy  12/2008  . Patella fracture surgery    . Esophagogastroduodenoscopy  02/02/05    EGD ED  . Esophagogastroduodenoscopy  03/08/00    EGD ED  . Cholecystectomy    . Lung surgery  2002    Lung collapsed  . Back surgery    . Intramedullary (im) nail intertrochanteric Right 11/04/2013    Procedure: INTERNAL FIXATION RIGHT HIP WITH GAMMA NAIL;  Surgeon: Carole Civil, MD;  Location: [facility];  Service: Orthopedics;  Laterality: Right;   Family History  Problem Relation Age of Onset  . Asthma Father   . Melanoma Brother    Social History  Substance Use Topics  . Smoking status: Former Smoker    Types: Cigarettes  . Smokeless tobacco: None  . Alcohol Use: No   OB History    No data available     Review of Systems  Constitutional: Negative for fever and chills.  Respiratory: Negative for shortness of breath.   Cardiovascular: Negative for chest pain and leg swelling.  Gastrointestinal: Positive for nausea. Negative for vomiting, abdominal pain and diarrhea.  Neurological: Negative for headaches.  All other systems reviewed and are negative.     Allergies  Penicillins  Home Medications   Prior to Admission medications   Medication Sig Start Date End Date Taking? Authorizing Provider  allopurinol (ZYLOPRIM) 100  MG tablet Take 100 mg by mouth 2 (two) times daily.    Yes Historical Provider, MD  ALPRAZolam (XANAX) 0.25 MG tablet Take 1 tablet (0.25 mg total) by mouth 3 (three) times daily as needed for anxiety. 11/06/13  Yes Radene Gunning, NP  aspirin EC 81 MG tablet Take 81 mg by mouth daily.    Yes Historical Provider, MD  Cholecalciferol (VITAMIN D PO) Take 1 tablet by mouth every morning.   Yes Historical Provider, MD  citalopram (CELEXA) 20 MG tablet Take 20 mg by mouth at bedtime.    Yes Historical Provider, MD  diltiazem (CARTIA XT) 180 MG 24 hr capsule Take 180 mg by mouth 2 (two) times daily.   Yes  Historical Provider, MD  donepezil (ARICEPT) 10 MG tablet Take 10 mg by mouth at bedtime.   Yes Historical Provider, MD  hydrochlorothiazide (HYDRODIURIL) 25 MG tablet Take 1 tablet (25 mg total) by mouth every morning. Resume on 11/13/13. 11/06/13  Yes Lezlie Octave Black, NP  lisinopril (PRINIVIL,ZESTRIL) 5 MG tablet Take 5 mg by mouth daily.   Yes Historical Provider, MD  metoprolol tartrate (LOPRESSOR) 25 MG tablet Take 12.5 mg by mouth 2 (two) times daily.    Yes Historical Provider, MD  omeprazole (PRILOSEC) 40 MG capsule Take 40 mg by mouth daily.   Yes Historical Provider, MD  SPIRIVA HANDIHALER 18 MCG inhalation capsule USE 1 CAPSULE VIA HANDIHALER ONCE DAILY 07/20/15  Yes Historical Provider, MD  traMADol (ULTRAM) 50 MG tablet Take 1 tablet (50 mg total) by mouth every 6 (six) hours as needed. 08/04/15  Yes Carole Civil, MD  vitamin B-12 (CYANOCOBALAMIN) 100 MCG tablet Take 100 mcg by mouth daily.   Yes Historical Provider, MD  zolpidem (AMBIEN) 10 MG tablet Take 5 mg by mouth at bedtime. For sleep   Yes Historical Provider, MD   BP 157/77 mmHg  Pulse 76  Temp(Src) 98.3 F (36.8 C) (Oral)  Resp 16  Ht '5\' 7"'$  (1.702 m)  Wt 65.772 kg  BMI 22.71 kg/m2  SpO2 100% Physical Exam  Constitutional: No distress.  Frail and elderly  HENT:  Head: Normocephalic and atraumatic.  Right Ear: External ear normal.  Left Ear: External ear normal.  Eyes: Conjunctivae are normal. Right eye exhibits no discharge. Left eye exhibits no discharge. No scleral icterus.  Neck: Neck supple. No tracheal deviation present.  Cardiovascular: Normal rate, regular rhythm and intact distal pulses.   Pulmonary/Chest: Effort normal and breath sounds normal. No stridor. No respiratory distress. She has no wheezes. She has no rales.  Abdominal: Soft. Bowel sounds are normal. She exhibits no distension. There is no tenderness. There is no rebound and no guarding.  Musculoskeletal: She exhibits no edema or tenderness.   Contraction deformities in fingers  Neurological: She is alert. She has normal strength. No cranial nerve deficit (no facial droop, extraocular movements intact, no slurred speech) or sensory deficit. She exhibits normal muscle tone. She displays no seizure activity. Coordination normal.  Skin: Skin is warm and dry. No rash noted.  Skin on extremities is tight and shiny consistent with hx of scleroderma   Psychiatric: She has a normal mood and affect.  Nursing note and vitals reviewed.   ED Course  Procedures (including critical care time) DIAGNOSTIC STUDIES: Oxygen Saturation is 96% on RA, normal by my interpretation.    COORDINATION OF CARE: 9:33 AM- Pt advised of plan for treatment and pt agrees. Pt will receive lab work for further  evaluation. She will also receive IV fluids.    Labs Review Labs Reviewed  CBC - Abnormal; Notable for the following:    MCV 100.5 (*)    All other components within normal limits  COMPREHENSIVE METABOLIC PANEL - Abnormal; Notable for the following:    Glucose, Bld 201 (*)    ALT 12 (*)    All other components within normal limits  URINALYSIS, ROUTINE W REFLEX MICROSCOPIC (NOT AT [facility]) - Abnormal; Notable for the following:    Specific Gravity, Urine <1.005 (*)    All other components within normal limits  URINE CULTURE  TROPONIN I  LIPASE, BLOOD     Tina Rank, MD has personally reviewed and evaluated these images and lab results as part of his medical decision-making.   EKG Interpretation   Date/Time:  Tuesday Aug 30 2015 09:14:59 EDT Ventricular Rate:  77 PR Interval:  206 QRS Duration: 116 QT Interval:  465 QTC Calculation: 526 R Axis:   -17 Text Interpretation:  Sinus rhythm Ventricular trigeminy , new since last  tracing Incomplete left bundle branch block Low voltage, extremity and  precordial leads Baseline wander in lead(s) II III aVF t wave abnormality  on prior ECG not present on current ECG Confirmed by Corona Popovich  MD-J, [place]   ([id number]) on 08/30/2015 9:25:20 AM      MDM   Final diagnoses:  Nausea    1135  Pt is feeling better after having a BM.   Family requests a urine because she has been urinating more frequently.  1214  UA is normal.  Sx again have all resolved.  It is possible her sx were related to her IBS.  After a BM in the ED she felt much better.  Labs EKG are reassuring.  Doubt acute infection, obstruction or cardiac issues.  Stable for discharge.   I personally performed the services described in this documentation, which was scribed in my presence.  The recorded information has been reviewed and is accurate.   Tina Rank, MD 08/30/15 1215

## 2015-08-30 NOTE — Discharge Instructions (Signed)
Nausea, Adult °Nausea is the feeling that you have an upset stomach or have to vomit. Nausea by itself is not likely a serious concern, but it may be an early sign of more serious medical problems. As nausea gets worse, it can lead to vomiting. If vomiting develops, there is the risk of dehydration.  °CAUSES  °· Viral infections. °· Food poisoning. °· Medicines. °· Pregnancy. °· Motion sickness. °· Migraine headaches. °· Emotional distress. °· Severe pain from any source. °· Alcohol intoxication. °HOME CARE INSTRUCTIONS °· Get plenty of rest. °· Ask your caregiver about specific rehydration instructions. °· Eat small amounts of food and sip liquids more often. °· Take all medicines as told by your caregiver. °SEEK MEDICAL CARE IF: °· You have not improved after 2 days, or you get worse. °· You have a headache. °SEEK IMMEDIATE MEDICAL CARE IF:  °· You have a fever. °· You faint. °· You keep vomiting or have blood in your vomit. °· You are extremely weak or dehydrated. °· You have dark or bloody stools. °· You have severe chest or abdominal pain. °MAKE SURE YOU: °· Understand these instructions. °· Will watch your condition. °· Will get help right away if you are not doing well or get worse. °  °This information is not intended to replace advice given to you by your health care provider. Make sure you discuss any questions you have with your health care provider. °  °Document Released: 05/03/2004 Document Revised: 04/16/2014 Document Reviewed: 12/06/2010 °Elsevier Interactive Patient Education ©2016 [employer] ° °

## 2015-08-31 LAB — URINE CULTURE

## 2015-09-05 ENCOUNTER — Emergency Department (HOSPITAL_COMMUNITY): Payer: Medicare Other

## 2015-09-05 ENCOUNTER — Inpatient Hospital Stay (HOSPITAL_COMMUNITY)
Admission: EM | Admit: 2015-09-05 | Discharge: 2015-09-08 | DRG: 190 | Disposition: A | Discharge status: Home Health | Payer: Medicare Other | Attending: Internal Medicine | Admitting: Internal Medicine

## 2015-09-05 ENCOUNTER — Encounter (HOSPITAL_COMMUNITY): Payer: Self-pay | Admitting: Emergency Medicine

## 2015-09-05 DIAGNOSIS — Z87891 Personal history of nicotine dependence: Secondary | ICD-10-CM | POA: Diagnosis not present

## 2015-09-05 DIAGNOSIS — J440 Chronic obstructive pulmonary disease with acute lower respiratory infection: Secondary | ICD-10-CM | POA: Diagnosis not present

## 2015-09-05 DIAGNOSIS — Z8673 Personal history of transient ischemic attack (TIA), and cerebral infarction without residual deficits: Secondary | ICD-10-CM

## 2015-09-05 DIAGNOSIS — Z9049 Acquired absence of other specified parts of digestive tract: Secondary | ICD-10-CM | POA: Diagnosis not present

## 2015-09-05 DIAGNOSIS — E7800 Pure hypercholesterolemia, unspecified: Secondary | ICD-10-CM | POA: Diagnosis present

## 2015-09-05 DIAGNOSIS — J441 Chronic obstructive pulmonary disease with (acute) exacerbation: Secondary | ICD-10-CM | POA: Diagnosis present

## 2015-09-05 DIAGNOSIS — J189 Pneumonia, unspecified organism: Secondary | ICD-10-CM | POA: Diagnosis not present

## 2015-09-05 DIAGNOSIS — Z808 Family history of malignant neoplasm of other organs or systems: Secondary | ICD-10-CM | POA: Diagnosis not present

## 2015-09-05 DIAGNOSIS — Z825 Family history of asthma and other chronic lower respiratory diseases: Secondary | ICD-10-CM | POA: Diagnosis not present

## 2015-09-05 DIAGNOSIS — D509 Iron deficiency anemia, unspecified: Secondary | ICD-10-CM | POA: Diagnosis present

## 2015-09-05 DIAGNOSIS — G4730 Sleep apnea, unspecified: Secondary | ICD-10-CM | POA: Diagnosis present

## 2015-09-05 DIAGNOSIS — J9601 Acute respiratory failure with hypoxia: Secondary | ICD-10-CM | POA: Diagnosis not present

## 2015-09-05 DIAGNOSIS — J449 Chronic obstructive pulmonary disease, unspecified: Secondary | ICD-10-CM | POA: Diagnosis present

## 2015-09-05 DIAGNOSIS — F39 Unspecified mood [affective] disorder: Secondary | ICD-10-CM | POA: Diagnosis present

## 2015-09-05 DIAGNOSIS — R1084 Generalized abdominal pain: Secondary | ICD-10-CM | POA: Diagnosis not present

## 2015-09-05 DIAGNOSIS — Z7951 Long term (current) use of inhaled steroids: Secondary | ICD-10-CM

## 2015-09-05 DIAGNOSIS — G8929 Other chronic pain: Secondary | ICD-10-CM | POA: Diagnosis present

## 2015-09-05 DIAGNOSIS — R0602 Shortness of breath: Secondary | ICD-10-CM | POA: Diagnosis not present

## 2015-09-05 DIAGNOSIS — K219 Gastro-esophageal reflux disease without esophagitis: Secondary | ICD-10-CM | POA: Diagnosis present

## 2015-09-05 DIAGNOSIS — Z7982 Long term (current) use of aspirin: Secondary | ICD-10-CM

## 2015-09-05 DIAGNOSIS — I10 Essential (primary) hypertension: Secondary | ICD-10-CM | POA: Diagnosis not present

## 2015-09-05 DIAGNOSIS — R079 Chest pain, unspecified: Secondary | ICD-10-CM | POA: Diagnosis not present

## 2015-09-05 DIAGNOSIS — Z66 Do not resuscitate: Secondary | ICD-10-CM | POA: Diagnosis present

## 2015-09-05 DIAGNOSIS — E119 Type 2 diabetes mellitus without complications: Secondary | ICD-10-CM | POA: Diagnosis present

## 2015-09-05 DIAGNOSIS — R000 Tachycardia, unspecified: Secondary | ICD-10-CM | POA: Diagnosis not present

## 2015-09-05 DIAGNOSIS — J438 Other emphysema: Secondary | ICD-10-CM | POA: Diagnosis not present

## 2015-09-05 LAB — CBC
HCT: 42.1 % (ref 36.0–46.0)
HEMOGLOBIN: 13.4 g/dL (ref 12.0–15.0)
MCH: 32.1 pg (ref 26.0–34.0)
MCHC: 31.8 g/dL (ref 30.0–36.0)
MCV: 100.7 fL — ABNORMAL HIGH (ref 78.0–100.0)
PLATELETS: 435 10*3/uL — AB (ref 150–400)
RBC: 4.18 MIL/uL (ref 3.87–5.11)
RDW: 14.5 % (ref 11.5–15.5)
WBC: 13.7 10*3/uL — ABNORMAL HIGH (ref 4.0–10.5)

## 2015-09-05 LAB — COMPREHENSIVE METABOLIC PANEL
ALBUMIN: 4.4 g/dL (ref 3.5–5.0)
ALK PHOS: 79 U/L (ref 38–126)
ALT: 15 U/L (ref 14–54)
AST: 29 U/L (ref 15–41)
Anion gap: 10 (ref 5–15)
BILIRUBIN TOTAL: 0.5 mg/dL (ref 0.3–1.2)
BUN: 16 mg/dL (ref 6–20)
CALCIUM: 9.5 mg/dL (ref 8.9–10.3)
CO2: 24 mmol/L (ref 22–32)
CREATININE: 0.9 mg/dL (ref 0.44–1.00)
Chloride: 101 mmol/L (ref 101–111)
GFR calc Af Amer: >60 mL/min (ref >60)
GFR calc non Af Amer: 59 mL/min — ABNORMAL LOW (ref >60)
GLUCOSE: 146 mg/dL — AB (ref 65–99)
Potassium: 3.9 mmol/L (ref 3.5–5.1)
SODIUM: 135 mmol/L (ref 135–145)
TOTAL PROTEIN: 8.5 g/dL — AB (ref 6.5–8.1)

## 2015-09-05 LAB — URINALYSIS, ROUTINE W REFLEX MICROSCOPIC
BILIRUBIN URINE: NEGATIVE
Glucose, UA: NEGATIVE mg/dL
HGB URINE DIPSTICK: NEGATIVE
KETONES UR: NEGATIVE mg/dL
Leukocytes, UA: NEGATIVE
Nitrite: NEGATIVE
Protein, ur: NEGATIVE mg/dL
SPECIFIC GRAVITY, URINE: 1.02 (ref 1.005–1.030)
pH: 5.5 (ref 5.0–8.0)

## 2015-09-05 LAB — CBG MONITORING, ED: Glucose-Capillary: 189 mg/dL — ABNORMAL HIGH (ref 65–99)

## 2015-09-05 LAB — LIPASE, BLOOD: Lipase: 46 U/L (ref 11–51)

## 2015-09-05 LAB — TROPONIN I: TROPONIN I: 0.03 ng/mL (ref <0.031)

## 2015-09-05 LAB — BRAIN NATRIURETIC PEPTIDE: B Natriuretic Peptide: 208 pg/mL — ABNORMAL HIGH (ref 0.0–100.0)

## 2015-09-05 MED ORDER — IOPAMIDOL (ISOVUE-370) INJECTION 76%
100.0000 mL | Freq: Once | INTRAVENOUS | Status: AC | PRN
  Administered 2015-09-05: 100 mL via INTRAVENOUS

## 2015-09-05 MED ORDER — DEXTROSE 5 % IV SOLN
500.0000 mg | Freq: Once | INTRAVENOUS | Status: AC
  Administered 2015-09-05: 500 mg via INTRAVENOUS
  Filled 2015-09-05: qty 500

## 2015-09-05 MED ORDER — CEFTRIAXONE SODIUM 1 G IJ SOLR
1.0000 g | Freq: Once | INTRAMUSCULAR | Status: AC
  Administered 2015-09-05: 1 g via INTRAVENOUS
  Filled 2015-09-05: qty 10

## 2015-09-05 MED ORDER — ONDANSETRON HCL 4 MG/2ML IJ SOLN
4.0000 mg | Freq: Once | INTRAMUSCULAR | Status: AC
  Administered 2015-09-05: 4 mg via INTRAVENOUS
  Filled 2015-09-05: qty 2

## 2015-09-05 NOTE — ED Notes (Signed)
Pt ambulated back from the restroom with a steady gate, pt's O2 sat was 80% on RA. Pt denies wearing O2 at home. Pt placed on 3L [place] and O2 sat rose to 96%. Pt denied SOB

## 2015-09-05 NOTE — H&P (Signed)
History and Physical    Tina [name] JXB:[id number] DOB: 1933/07/16 DOA: 09/05/2015  Referring MD/NP/PA: Merrily Pew, MD  PCP: Purvis Kilts, MD  Outpatient Specialists:  Patient coming from: Home   Chief Complaint: abdominal pain  HPI: Tina [name] is a 82 y.o. female with medical history significant of COPD, DM Type 2, and Barrett's esophagus presented with complaints of abdominal pain and mild diarrhea. Patient is mildly confused and unable to give a complete history.  Family said she has been coughing for a few days.  She was given supplemental oxygen and IV Rocephin and ZIthromax given.  Due to her hypoxia, hospitalist was asked to admit her for further Tx.   ED Course: Evaluation in the ED, CAP revealed on CXR. Wbc 13.7; BNP 208.0; mild hypoxia. UA negative.   Review of Systems: As per HPI otherwise 10 point review of systems negative.    Past Medical History  Diagnosis Date  . Lower abdominal pain   . Chronic diarrhea   . Barrett's esophagus   . Irritable bowel syndrome   . Helicobacter pylori (H. pylori)   . Gastritis   . Type 2 diabetes mellitus ([place])   . Essential hypertension, benign   . Sleep apnea     Not on CPAP  . 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 Scleroderma ([place])   . COPD (chronic obstructive pulmonary disease) ([place])   . Raynaud's disease   . Gout   . Stroke [facility])     Past Surgical History  Procedure Laterality Date  . Esophagogastroduodenoscopy  06/09  . Colonoscopy  12/2008  . Patella fracture surgery    . Esophagogastroduodenoscopy  02/02/05    EGD ED  . Esophagogastroduodenoscopy  03/08/00    EGD ED  . Cholecystectomy    . Lung surgery  2002    Lung collapsed  . Back surgery    . Intramedullary (im) nail intertrochanteric Right 11/04/2013    Procedure: INTERNAL FIXATION RIGHT HIP WITH GAMMA NAIL;  Surgeon: Carole Civil, MD;  Location: [facility];  Service: Orthopedics;  Laterality: Right;     reports that she has quit smoking. Her  smoking use included Cigarettes. She does not have any smokeless tobacco history on file. She reports that she does not drink alcohol or use illicit drugs.  Allergies  Allergen Reactions  . Penicillins Other (See Comments)    Family History  Problem Relation Age of Onset  . Asthma Father   . Melanoma Brother     Prior to Admission medications   Medication Sig Start Date End Date Taking? Authorizing Provider  allopurinol (ZYLOPRIM) 100 MG tablet Take 100 mg by mouth 2 (two) times daily.    Yes Historical Provider, MD  ALPRAZolam (XANAX) 0.25 MG tablet Take 1 tablet (0.25 mg total) by mouth 3 (three) times daily as needed for anxiety. Patient taking differently: Take 0.25 mg by mouth 3 (three) times daily as needed for anxiety or sleep.  11/06/13  Yes Radene Gunning, NP  aspirin EC 81 MG tablet Take 81 mg by mouth 2 (two) times daily.    Yes Historical Provider, MD  Cholecalciferol (VITAMIN D PO) Take 1 tablet by mouth every morning.   Yes Historical Provider, MD  citalopram (CELEXA) 20 MG tablet Take 20 mg by mouth at bedtime.    Yes Historical Provider, MD  diltiazem (CARTIA XT) 180 MG 24 hr capsule Take 180 mg by mouth 2 (two) times daily.   Yes Historical Provider,  MD  donepezil (ARICEPT) 10 MG tablet Take 10 mg by mouth at bedtime.   Yes Historical Provider, MD  ferrous sulfate 325 (65 FE) MG tablet Take 325 mg by mouth every morning.   Yes Historical Provider, MD  Glycopyrrolate-Formoterol (BEVESPI AEROSPHERE) 9-4.8 MCG/ACT AERO Inhale 2 puffs into the lungs 2 (two) times daily.   Yes Historical Provider, MD  hydrochlorothiazide (HYDRODIURIL) 25 MG tablet Take 1 tablet (25 mg total) by mouth every morning. Resume on 11/13/13. 11/06/13  Yes Lezlie Octave Black, NP  lisinopril (PRINIVIL,ZESTRIL) 5 MG tablet Take 5 mg by mouth daily.   Yes Historical Provider, MD  Menthol, Topical Analgesic, 154 MG PADS Apply 1 each topically daily as needed (for arthritis pain to right arm-shoulder).   Yes  Historical Provider, MD  metoprolol tartrate (LOPRESSOR) 25 MG tablet Take 12.5 mg by mouth 2 (two) times daily.    Yes Historical Provider, MD  mirtazapine (REMERON) 15 MG tablet Take 7.5 mg by mouth at bedtime.   Yes Historical Provider, MD  omeprazole (PRILOSEC) 40 MG capsule Take 40 mg by mouth every evening.    Yes Historical Provider, MD  SPIRIVA HANDIHALER 18 MCG inhalation capsule USE 1 CAPSULE VIA HANDIHALER ONCE DAILY 07/20/15  Yes Historical Provider, MD  traMADol (ULTRAM) 50 MG tablet Take 1 tablet (50 mg total) by mouth every 6 (six) hours as needed. Patient taking differently: Take 25 mg by mouth 2 (two) times daily.  08/04/15  Yes Carole Civil, MD  vitamin B-12 (CYANOCOBALAMIN) 100 MCG tablet Take 100 mcg by mouth daily.   Yes Historical Provider, MD  zolpidem (AMBIEN) 10 MG tablet Take 5 mg by mouth at bedtime. For sleep   Yes Historical Provider, MD    Physical Exam: Filed Vitals:   09/05/15 1706 09/05/15 1938 09/05/15 2000  BP: 164/53 182/62 172/62  Pulse: 64 82 71  Temp: 98.4 F (36.9 C)    TempSrc: Temporal    Resp: '20 20 16  '$ Height: '5\' 8"'$  (1.727 m)    Weight: 72.576 kg (160 lb)    SpO2: 92% 96% 97%      Constitutional: NAD, calm, comfortable Filed Vitals:   09/05/15 1706 09/05/15 1938 09/05/15 2000  BP: 164/53 182/62 172/62  Pulse: 64 82 71  Temp: 98.4 F (36.9 C)    TempSrc: Temporal    Resp: '20 20 16  '$ Height: '5\' 8"'$  (1.727 m)    Weight: 72.576 kg (160 lb)    SpO2: 92% 96% 97%   Eyes: PERRL, lids and conjunctivae normal ENMT: Mucous membranes are moist. Posterior pharynx clear of any exudate or lesions.Normal dentition.  Neck: normal, supple, no masses, no thyromegaly Respiratory: clear to auscultation bilaterally, some wheezing, no crackles. Normal respiratory effort. No accessory muscle use.  Cardiovascular: Regular rate and rhythm, no murmurs / rubs / gallops. No extremity edema. 2+ pedal pulses. No carotid bruits.  Abdomen: no tenderness, no  masses palpated. No hepatosplenomegaly. Bowel sounds positive.  Musculoskeletal: no clubbing / cyanosis. No joint deformity upper and lower extremities. Good ROM, no contractures. Normal muscle tone.  Skin: no rashes, lesions, ulcers. No induration Neurologic: CN 2-12 grossly intact. Sensation intact, DTR normal. Strength 5/5 in all 4.  Psychiatric: Normal judgment and insight. Alert and oriented x 3. Normal mood.   Labs on Admission: I have personally reviewed following labs and imaging studies  CBC:  Recent Labs Lab 08/30/15 0940 09/05/15 1745  WBC 9.0 13.7*  HGB 13.6 13.4  HCT 42.0 42.1  MCV 100.5* 100.7*  PLT 344 716*   Basic Metabolic Panel:  Recent Labs Lab 08/30/15 0940 09/05/15 1745  NA 139 135  K 3.8 3.9  CL 102 101  CO2 27 24  GLUCOSE 201* 146*  BUN 8 16  CREATININE 0.77 0.90  CALCIUM 9.4 9.5   GFR: Estimated Creatinine Clearance: 50.3 mL/min (by C-G formula based on Cr of 0.9).   Recent Labs Lab 08/30/15 0940 09/05/15 1745  AST 21 29  ALT 12* 15  ALKPHOS 82 79  BILITOT 0.7 0.5  PROT 7.6 8.5*  ALBUMIN 3.8 4.4    Recent Labs Lab 08/30/15 0940 09/05/15 1745  LIPASE 34 46  Cardiac Enzymes:  Recent Labs Lab 08/30/15 0940 09/05/15 1745  TROPONINI <0.03 0.03  CBG:  Recent Labs Lab 08/30/15 1248 09/05/15 1941  GLUCAP 153* 189*   Urine analysis:    Component Value Date/Time   COLORURINE YELLOW 09/05/2015 1750   APPEARANCEUR CLEAR 09/05/2015 1750   LABSPEC 1.020 09/05/2015 1750   PHURINE 5.5 09/05/2015 1750   GLUCOSEU NEGATIVE 09/05/2015 1750   HGBUR NEGATIVE 09/05/2015 1750   BILIRUBINUR NEGATIVE 09/05/2015 1750   KETONESUR NEGATIVE 09/05/2015 1750   PROTEINUR NEGATIVE 09/05/2015 1750   UROBILINOGEN 0.2 04/25/2014 2025   NITRITE NEGATIVE 09/05/2015 1750   LEUKOCYTESUR NEGATIVE 09/05/2015 1750    Recent Results (from the past 240 hour(s))  Urine culture     Status: Abnormal   Collection Time: 08/30/15 10:02 AM  Result Value  Ref Range Status   Specimen Description URINE, CLEAN CATCH  Final   Special Requests NONE  Final   Culture MULTIPLE SPECIES PRESENT, SUGGEST RECOLLECTION (A)  Final   Report Status 08/31/2015 FINAL  Final     Radiological Exams on Admission: Ct Angio Chest Pe W/cm &/or Wo Cm  09/05/2015  CLINICAL DATA:  Hypoxia, tachycardia. EXAM: CT ANGIOGRAPHY CHEST WITH CONTRAST TECHNIQUE: Multidetector CT imaging of the chest was performed using the standard protocol during bolus administration of intravenous contrast. Multiplanar CT image reconstructions and MIPs were obtained to evaluate the vascular anatomy. CONTRAST:  100 cc Isovue 370 IV COMPARISON:  Radiograph earlier this day. FINDINGS: There are no filling defects within the pulmonary arteries to suggest pulmonary embolus. Evaluation of the right lower lobe pulmonary arteries partially obscured by motion. Ectatic atherosclerotic thoracic aorta without aneurysm or dissection. No periaortic soft tissue stranding. Coronary artery calcifications. Small AP window lymph node, 6 mm short axis, not enlarged by size criteria. No suspicious mediastinal or hilar adenopathy. No pericardial effusion. Patulous dilated esophagus containing intraluminal ingested contents. Surgical sutures in the right upper lobe, small right apical bleb. Moderate emphysema. Pleural-based calcifications in the right lung most prominent anteriorly. No frank pleural effusion. Patchy and confluent left lower lobe consolidation in the dependent lung consistent with pneumonia. Scattered atelectasis and scarring throughout the right lung, no definite right lung consolidation. Trachea and central airways are patent. No acute abnormality in the included upper abdomen. There are no acute or suspicious osseous abnormalities. Review of the MIP images confirms the above findings. IMPRESSION: 1. No pulmonary embolus. 2. Patchy and confluent left lower lobe consolidation most consistent with pneumonia.  Recommend radiographic follow-up to ensure clearing with PA and lateral views in 3-4 weeks. 3. Postsurgical change in the right lung. Right lung pleural calcifications, unchanged from remote chest CT dating back to 2005. Emphysema. 4. Atherosclerosis, including coronary artery calcifications. 5. Patulous mildly dilated esophagus containing ingested material. Electronically Signed   By: Jeb Levering  M.D.   On: 09/05/2015 22:46   Dg Abd Acute W/chest  09/05/2015  CLINICAL DATA:  Acute onset of generalized abdominal pain and shortness of breath. Diarrhea. Initial encounter. EXAM: DG ABDOMEN ACUTE W/ 1V CHEST COMPARISON:  Chest radiograph performed 06/23/2014, and CT of the abdomen and pelvis performed 08/04/2010 FINDINGS: The lungs are well-aerated. Vascular congestion is noted. Patchy bibasilar airspace opacities raise concern for pneumonia. The appearance is less typical for pulmonary edema. Persistent right apical scarring is noted, similar in appearance to the prior study, with associated postoperative change. There is no evidence of pleural effusion or pneumothorax. The cardiomediastinal silhouette is borderline normal in size. The visualized bowel gas pattern is unremarkable. Scattered stool and air are seen within the colon; there is no evidence of small bowel dilatation to suggest obstruction. No free intra-abdominal air is identified on the provided upright view. Clips are noted within the right upper quadrant, reflecting prior cholecystectomy. No acute osseous abnormalities are seen; the sacroiliac joints are unremarkable in appearance. Prominence of the right lesser femoral trochanter is thought to reflect prior injury. The visualized portions of the right femoral intramedullary rod and screw are grossly unremarkable. IMPRESSION: 1. Vascular congestion noted. Patchy bibasilar airspace opacities raise concern for pneumonia. The appearance is less typical for pulmonary edema. 2. Stable appearance to  right apical scarring, with associated postoperative change. 3. Unremarkable bowel gas pattern; no free intra-abdominal air seen. Moderate amount of stool noted in the colon. Electronically Signed   By: Garald Balding M.D.   On: 09/05/2015 19:02      Assessment/Plan:   1. CAP, Revealed on CXR, WBC mildly elevated. She is afebrile. Given her history of COPD and hypoxia, will admit for IV antibiotics and steroids.  Recommendations for follow up CT Chest in 3-4 weeks. 2. Acute respiratory failure with hypoxia. Started on steroid, oxygen and antibiotics.  At baseline does not require oxygen Continue to wean as tolerated.  She may qualify for home oxygen upon discharge.  3. Essential HTN, stable. Continue home medications.  4. DM type 2. Stable.    DVT prophylaxis:  Code Status: DNR  Family Communication:Daughter [name] and son-in-law bedside.  Disposition Plan: Anticipate discharge home in 2-3 days.  Consults called: None Admission status: Inpatient    Orvan Falconer MD. Rosalita Chessman. [facility]  If 7PM-7AM, please contact night-coverage [email or web address] Password [email or web address]  09/05/2015, 11:07 PM   By signing my name below, I, Rennis Harding, attest that this documentation has been prepared under the direction and in the presence of Orvan Falconer, MD. Electronically signed: Rennis Harding, Scribe.

## 2015-09-05 NOTE — ED Notes (Signed)
Pt's daughter states they ate at [employer] a little while ago and then went shopping and pt developed severe abdominal pain.

## 2015-09-05 NOTE — ED Notes (Signed)
Patient called for bedpan. Patient off bedpan and repositioned at this time. Patient states that IV in left hand is hurting.

## 2015-09-05 NOTE — ED Provider Notes (Signed)
CSN: [id number]     Arrival date & time 09/05/15  1703 History   First MD Initiated Contact with Patient 09/05/15 1716     Chief Complaint  Patient presents with  . Abdominal Pain     (Consider location/radiation/quality/duration/timing/severity/associated sxs/prior Treatment) Patient is a 82 y.o. female presenting with vomiting.  Emesis Severity:  Mild Duration:  2 hours Quality:  Stomach contents Able to tolerate:  Liquids Chronicity:  New Recent urination:  Normal Worsened by:  Nothing tried Ineffective treatments:  None tried Associated symptoms: abdominal pain and diarrhea   Associated symptoms: no chills     Past Medical History  Diagnosis Date  . Lower abdominal pain   . Chronic diarrhea   . Barrett's esophagus   . Irritable bowel syndrome   . Helicobacter pylori (H. pylori)   . Gastritis   . Type 2 diabetes mellitus ([place])   . Essential hypertension, benign   . Sleep apnea     Not on CPAP  . 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 Scleroderma ([place])   . COPD (chronic obstructive pulmonary disease) ([place])   . Raynaud's disease   . Gout   . Stroke [facility])    Past Surgical History  Procedure Laterality Date  . Esophagogastroduodenoscopy  06/09  . Colonoscopy  12/2008  . Patella fracture surgery    . Esophagogastroduodenoscopy  02/02/05    EGD ED  . Esophagogastroduodenoscopy  03/08/00    EGD ED  . Cholecystectomy    . Lung surgery  2002    Lung collapsed  . Back surgery    . Intramedullary (im) nail intertrochanteric Right 11/04/2013    Procedure: INTERNAL FIXATION RIGHT HIP WITH GAMMA NAIL;  Surgeon: Carole Civil, MD;  Location: [facility];  Service: Orthopedics;  Laterality: Right;   Family History  Problem Relation Age of Onset  . Asthma Father   . Melanoma Brother    Social History  Substance Use Topics  . Smoking status: Former Smoker    Types: Cigarettes  . Smokeless tobacco: None  . Alcohol Use: No   OB History    No data available     Review of  Systems  Constitutional: Negative for fever and chills.  Respiratory: Positive for cough and shortness of breath.   Cardiovascular: Negative for chest pain, palpitations and leg swelling.  Gastrointestinal: Positive for nausea, vomiting, abdominal pain and diarrhea.  Genitourinary: Negative for dysuria and pelvic pain.  All other systems reviewed and are negative.     Allergies  Penicillins  Home Medications   Prior to Admission medications   Medication Sig Start Date End Date Taking? Authorizing Provider  allopurinol (ZYLOPRIM) 100 MG tablet Take 100 mg by mouth 2 (two) times daily.    Yes Historical Provider, MD  ALPRAZolam (XANAX) 0.25 MG tablet Take 1 tablet (0.25 mg total) by mouth 3 (three) times daily as needed for anxiety. Patient taking differently: Take 0.25 mg by mouth 3 (three) times daily as needed for anxiety or sleep.  11/06/13  Yes Radene Gunning, NP  aspirin EC 81 MG tablet Take 81 mg by mouth 2 (two) times daily.    Yes Historical Provider, MD  Cholecalciferol (VITAMIN D PO) Take 1 tablet by mouth every morning.   Yes Historical Provider, MD  citalopram (CELEXA) 20 MG tablet Take 20 mg by mouth at bedtime.    Yes Historical Provider, MD  diltiazem (CARTIA XT) 180 MG 24 hr capsule Take 180 mg by mouth 2 (two)  times daily.   Yes Historical Provider, MD  donepezil (ARICEPT) 10 MG tablet Take 10 mg by mouth at bedtime.   Yes Historical Provider, MD  ferrous sulfate 325 (65 FE) MG tablet Take 325 mg by mouth every morning.   Yes Historical Provider, MD  Glycopyrrolate-Formoterol (BEVESPI AEROSPHERE) 9-4.8 MCG/ACT AERO Inhale 2 puffs into the lungs 2 (two) times daily.   Yes Historical Provider, MD  hydrochlorothiazide (HYDRODIURIL) 25 MG tablet Take 1 tablet (25 mg total) by mouth every morning. Resume on 11/13/13. 11/06/13  Yes Lezlie Octave Black, NP  lisinopril (PRINIVIL,ZESTRIL) 5 MG tablet Take 5 mg by mouth daily.   Yes Historical Provider, MD  Menthol, Topical Analgesic, 154 MG  PADS Apply 1 each topically daily as needed (for arthritis pain to right arm-shoulder).   Yes Historical Provider, MD  metoprolol tartrate (LOPRESSOR) 25 MG tablet Take 12.5 mg by mouth 2 (two) times daily.    Yes Historical Provider, MD  mirtazapine (REMERON) 15 MG tablet Take 7.5 mg by mouth at bedtime.   Yes Historical Provider, MD  omeprazole (PRILOSEC) 40 MG capsule Take 40 mg by mouth every evening.    Yes Historical Provider, MD  SPIRIVA HANDIHALER 18 MCG inhalation capsule USE 1 CAPSULE VIA HANDIHALER ONCE DAILY 07/20/15  Yes Historical Provider, MD  traMADol (ULTRAM) 50 MG tablet Take 1 tablet (50 mg total) by mouth every 6 (six) hours as needed. Patient taking differently: Take 25 mg by mouth 2 (two) times daily.  08/04/15  Yes Carole Civil, MD  vitamin B-12 (CYANOCOBALAMIN) 100 MCG tablet Take 100 mcg by mouth daily.   Yes Historical Provider, MD  zolpidem (AMBIEN) 10 MG tablet Take 5 mg by mouth at bedtime. For sleep   Yes Historical Provider, MD   BP 172/62 mmHg  Pulse 71  Temp(Src) 98.4 F (36.9 C) (Temporal)  Resp 16  Ht '5\' 8"'$  (1.727 m)  Wt 160 lb (72.576 kg)  BMI 24.33 kg/m2  SpO2 97% Physical Exam  Constitutional: She is oriented to person, place, and time. She appears well-developed and well-nourished.  HENT:  Head: Normocephalic and atraumatic.  Eyes: Conjunctivae are normal. Pupils are equal, round, and reactive to light.  Neck: Normal range of motion.  Cardiovascular: Normal rate and regular rhythm.   Pulmonary/Chest: No stridor. No respiratory distress. She has rales (bilateral lower lobes).  Abdominal: Soft. She exhibits no distension. There is no tenderness.  Musculoskeletal: Normal range of motion. She exhibits no edema or tenderness.  Neurological: She is alert and oriented to person, place, and time.  Skin: Skin is warm and dry.  Nursing note and vitals reviewed.   ED Course  Procedures (including critical care time) Labs Review Labs Reviewed   COMPREHENSIVE METABOLIC PANEL - Abnormal; Notable for the following:    Glucose, Bld 146 (*)    Total Protein 8.5 (*)    GFR calc non Af Amer 59 (*)    All other components within normal limits  CBC - Abnormal; Notable for the following:    WBC 13.7 (*)    MCV 100.7 (*)    Platelets 435 (*)    All other components within normal limits  BRAIN NATRIURETIC PEPTIDE - Abnormal; Notable for the following:    B Natriuretic Peptide 208.0 (*)    All other components within normal limits  CBG MONITORING, ED - Abnormal; Notable for the following:    Glucose-Capillary 189 (*)    All other components within normal limits  LIPASE,  BLOOD  URINALYSIS, ROUTINE W REFLEX MICROSCOPIC (NOT AT [facility])  TROPONIN I    Imaging Review Ct Angio Chest Pe W/cm &/or Wo Cm  09/05/2015  CLINICAL DATA:  Hypoxia, tachycardia. EXAM: CT ANGIOGRAPHY CHEST WITH CONTRAST TECHNIQUE: Multidetector CT imaging of the chest was performed using the standard protocol during bolus administration of intravenous contrast. Multiplanar CT image reconstructions and MIPs were obtained to evaluate the vascular anatomy. CONTRAST:  100 cc Isovue 370 IV COMPARISON:  Radiograph earlier this day. FINDINGS: There are no filling defects within the pulmonary arteries to suggest pulmonary embolus. Evaluation of the right lower lobe pulmonary arteries partially obscured by motion. Ectatic atherosclerotic thoracic aorta without aneurysm or dissection. No periaortic soft tissue stranding. Coronary artery calcifications. Small AP window lymph node, 6 mm short axis, not enlarged by size criteria. No suspicious mediastinal or hilar adenopathy. No pericardial effusion. Patulous dilated esophagus containing intraluminal ingested contents. Surgical sutures in the right upper lobe, small right apical bleb. Moderate emphysema. Pleural-based calcifications in the right lung most prominent anteriorly. No frank pleural effusion. Patchy and confluent left lower lobe  consolidation in the dependent lung consistent with pneumonia. Scattered atelectasis and scarring throughout the right lung, no definite right lung consolidation. Trachea and central airways are patent. No acute abnormality in the included upper abdomen. There are no acute or suspicious osseous abnormalities. Review of the MIP images confirms the above findings. IMPRESSION: 1. No pulmonary embolus. 2. Patchy and confluent left lower lobe consolidation most consistent with pneumonia. Recommend radiographic follow-up to ensure clearing with PA and lateral views in 3-4 weeks. 3. Postsurgical change in the right lung. Right lung pleural calcifications, unchanged from remote chest CT dating back to 2005. Emphysema. 4. Atherosclerosis, including coronary artery calcifications. 5. Patulous mildly dilated esophagus containing ingested material. Electronically Signed   By: Jeb Levering M.D.   On: 09/05/2015 22:46   Dg Abd Acute W/chest  09/05/2015  CLINICAL DATA:  Acute onset of generalized abdominal pain and shortness of breath. Diarrhea. Initial encounter. EXAM: DG ABDOMEN ACUTE W/ 1V CHEST COMPARISON:  Chest radiograph performed 06/23/2014, and CT of the abdomen and pelvis performed 08/04/2010 FINDINGS: The lungs are well-aerated. Vascular congestion is noted. Patchy bibasilar airspace opacities raise concern for pneumonia. The appearance is less typical for pulmonary edema. Persistent right apical scarring is noted, similar in appearance to the prior study, with associated postoperative change. There is no evidence of pleural effusion or pneumothorax. The cardiomediastinal silhouette is borderline normal in size. The visualized bowel gas pattern is unremarkable. Scattered stool and air are seen within the colon; there is no evidence of small bowel dilatation to suggest obstruction. No free intra-abdominal air is identified on the provided upright view. Clips are noted within the right upper quadrant, reflecting  prior cholecystectomy. No acute osseous abnormalities are seen; the sacroiliac joints are unremarkable in appearance. Prominence of the right lesser femoral trochanter is thought to reflect prior injury. The visualized portions of the right femoral intramedullary rod and screw are grossly unremarkable. IMPRESSION: 1. Vascular congestion noted. Patchy bibasilar airspace opacities raise concern for pneumonia. The appearance is less typical for pulmonary edema. 2. Stable appearance to right apical scarring, with associated postoperative change. 3. Unremarkable bowel gas pattern; no free intra-abdominal air seen. Moderate amount of stool noted in the colon. Electronically Signed   By: Garald Balding M.D.   On: 09/05/2015 19:02   I have personally reviewed and evaluated these images and lab results as part of my medical  decision-making.   EKG Interpretation   Date/Time:  Monday Sep 05 2015 17:22:27 EDT Ventricular Rate:  71 PR Interval:  230 QRS Duration: 105 QT Interval:  474 QTC Calculation: 515 R Axis:   -18 Text Interpretation:  Sinus rhythm Prolonged PR interval Borderline left  axis deviation Consider anterior infarct Prolonged QT interval Confirmed  by [facility] MD, Corene Cornea [phone]) on 09/05/2015 6:24:28 PM      MDM   Final diagnoses:  Acute respiratory failure with hypoxia ([place])  CAP (community acquired pneumonia)    Patient initially came in with abdominal pain is likely secondary to gastroenteritis possibly food borne as she started this a few hours after eating at [employer]. However on evaluation here patient noted to have hypoxia and crackles in her bilateral lower lobes with slight tachypnea. A chest x-ray and abdominal series were done showing a likely bilateral lower lobe pneumonia versus pulmonary edema. Had a BNP of 200 so evaluated for PE with CT scan which showed left lower lobe consolidative pneumonia. Patient still with intermittent tachypnea but gets hypoxic to low 80% when  she ambulates and she normally does not use oxygen. This in conjunction with her history of scleroderma make her high risk for complications so we'll admit to the hospital for continued monitoring and antibiotics.    Merrily Pew, MD 09/06/15 [phone]

## 2015-09-05 NOTE — ED Notes (Signed)
Pt having large amts loose stools

## 2015-09-06 DIAGNOSIS — E119 Type 2 diabetes mellitus without complications: Secondary | ICD-10-CM

## 2015-09-06 LAB — CBC
HCT: 38.8 % (ref 36.0–46.0)
HEMOGLOBIN: 12.4 g/dL (ref 12.0–15.0)
MCH: 32 pg (ref 26.0–34.0)
MCHC: 32 g/dL (ref 30.0–36.0)
MCV: 100.3 fL — ABNORMAL HIGH (ref 78.0–100.0)
PLATELETS: 391 10*3/uL (ref 150–400)
RBC: 3.87 MIL/uL (ref 3.87–5.11)
RDW: 14.5 % (ref 11.5–15.5)
WBC: 15.1 10*3/uL — ABNORMAL HIGH (ref 4.0–10.5)

## 2015-09-06 LAB — BASIC METABOLIC PANEL
Anion gap: 8 (ref 5–15)
BUN: 12 mg/dL (ref 6–20)
CALCIUM: 9.1 mg/dL (ref 8.9–10.3)
CO2: 27 mmol/L (ref 22–32)
CREATININE: 0.8 mg/dL (ref 0.44–1.00)
Chloride: 102 mmol/L (ref 101–111)
GFR calc non Af Amer: >60 mL/min (ref >60)
Glucose, Bld: 106 mg/dL — ABNORMAL HIGH (ref 65–99)
Potassium: 3.5 mmol/L (ref 3.5–5.1)
SODIUM: 137 mmol/L (ref 135–145)

## 2015-09-06 LAB — GLUCOSE, CAPILLARY: Glucose-Capillary: 191 mg/dL — ABNORMAL HIGH (ref 65–99)

## 2015-09-06 LAB — TROPONIN I

## 2015-09-06 MED ORDER — TRAMADOL HCL 50 MG PO TABS: 50.0000 mg | ORAL_TABLET | Freq: Two times a day (BID) | ORAL | Status: DC | PRN

## 2015-09-06 MED ORDER — ALBUTEROL SULFATE (2.5 MG/3ML) 0.083% IN NEBU
2.5000 mg | INHALATION_SOLUTION | Freq: Four times a day (QID) | RESPIRATORY_TRACT | Status: DC
  Administered 2015-09-06: 2.5 mg via RESPIRATORY_TRACT
  Filled 2015-09-06: qty 3

## 2015-09-06 MED ORDER — METOPROLOL TARTRATE 25 MG PO TABS
12.5000 mg | ORAL_TABLET | Freq: Two times a day (BID) | ORAL | Status: DC
  Administered 2015-09-06: 12.5 mg via ORAL
  Filled 2015-09-06 (×2): qty 1

## 2015-09-06 MED ORDER — HEPARIN SODIUM (PORCINE) 5000 UNIT/ML IJ SOLN
5000.0000 [IU] | Freq: Three times a day (TID) | INTRAMUSCULAR | Status: DC
  Administered 2015-09-06 – 2015-09-08 (×7): 5000 [IU] via SUBCUTANEOUS
  Filled 2015-09-06 (×8): qty 1

## 2015-09-06 MED ORDER — SENNOSIDES-DOCUSATE SODIUM 8.6-50 MG PO TABS
1.0000 | ORAL_TABLET | Freq: Two times a day (BID) | ORAL | Status: DC
  Administered 2015-09-06 – 2015-09-07 (×4): 1 via ORAL
  Filled 2015-09-06 (×4): qty 1

## 2015-09-06 MED ORDER — ALBUTEROL SULFATE (2.5 MG/3ML) 0.083% IN NEBU
2.5000 mg | INHALATION_SOLUTION | Freq: Three times a day (TID) | RESPIRATORY_TRACT | Status: DC
  Administered 2015-09-06 – 2015-09-08 (×7): 2.5 mg via RESPIRATORY_TRACT
  Filled 2015-09-06 (×7): qty 3

## 2015-09-06 MED ORDER — LEVOFLOXACIN IN D5W 750 MG/150ML IV SOLN: 750.0000 mg | INTRAVENOUS | Status: DC

## 2015-09-06 MED ORDER — CITALOPRAM HYDROBROMIDE 20 MG PO TABS
20.0000 mg | ORAL_TABLET | Freq: Every day | ORAL | Status: DC
  Administered 2015-09-06 – 2015-09-07 (×3): 20 mg via ORAL
  Filled 2015-09-06 (×3): qty 1

## 2015-09-06 MED ORDER — VITAMIN B-12 100 MCG PO TABS
100.0000 ug | ORAL_TABLET | Freq: Every day | ORAL | Status: DC
  Administered 2015-09-06 – 2015-09-07 (×2): 100 ug via ORAL
  Filled 2015-09-06 (×4): qty 1

## 2015-09-06 MED ORDER — LISINOPRIL 5 MG PO TABS: 5.0000 mg | ORAL_TABLET | Freq: Every day | ORAL | Status: DC

## 2015-09-06 MED ORDER — PREDNISONE 20 MG PO TABS
30.0000 mg | ORAL_TABLET | Freq: Every day | ORAL | Status: DC
  Administered 2015-09-07 – 2015-09-08 (×2): 30 mg via ORAL
  Filled 2015-09-06 (×2): qty 1

## 2015-09-06 MED ORDER — DONEPEZIL HCL 5 MG PO TABS
10.0000 mg | ORAL_TABLET | Freq: Every day | ORAL | Status: DC
  Administered 2015-09-06 – 2015-09-07 (×3): 10 mg via ORAL
  Filled 2015-09-06 (×3): qty 2

## 2015-09-06 MED ORDER — METHYLPREDNISOLONE SODIUM SUCC 40 MG IJ SOLR
40.0000 mg | Freq: Four times a day (QID) | INTRAMUSCULAR | Status: DC
  Administered 2015-09-06: 40 mg via INTRAVENOUS
  Filled 2015-09-06: qty 1

## 2015-09-06 MED ORDER — DILTIAZEM HCL ER COATED BEADS 180 MG PO CP24
180.0000 mg | ORAL_CAPSULE | Freq: Two times a day (BID) | ORAL | Status: DC
  Administered 2015-09-06: 180 mg via ORAL
  Filled 2015-09-06: qty 1

## 2015-09-06 MED ORDER — ALBUTEROL SULFATE (2.5 MG/3ML) 0.083% IN NEBU: 2.5000 mg | INHALATION_SOLUTION | RESPIRATORY_TRACT | Status: DC | PRN

## 2015-09-06 MED ORDER — ALLOPURINOL 100 MG PO TABS
100.0000 mg | ORAL_TABLET | Freq: Two times a day (BID) | ORAL | Status: DC
  Administered 2015-09-06 – 2015-09-07 (×5): 100 mg via ORAL
  Filled 2015-09-06 (×5): qty 1

## 2015-09-06 MED ORDER — ALPRAZOLAM 0.25 MG PO TABS
0.2500 mg | ORAL_TABLET | Freq: Three times a day (TID) | ORAL | Status: DC | PRN
  Administered 2015-09-06 – 2015-09-08 (×3): 0.25 mg via ORAL
  Filled 2015-09-06 (×3): qty 1

## 2015-09-06 MED ORDER — LEVOFLOXACIN IN D5W 750 MG/150ML IV SOLN
750.0000 mg | INTRAVENOUS | Status: DC
  Administered 2015-09-06 – 2015-09-07 (×2): 750 mg via INTRAVENOUS
  Filled 2015-09-06 (×2): qty 150

## 2015-09-06 MED ORDER — TRAMADOL HCL 50 MG PO TABS
25.0000 mg | ORAL_TABLET | Freq: Two times a day (BID) | ORAL | Status: DC
  Administered 2015-09-06: 25 mg via ORAL
  Filled 2015-09-06: qty 1

## 2015-09-06 MED ORDER — PANTOPRAZOLE SODIUM 40 MG PO TBEC
40.0000 mg | DELAYED_RELEASE_TABLET | Freq: Every day | ORAL | Status: DC
  Administered 2015-09-06 – 2015-09-07 (×2): 40 mg via ORAL
  Filled 2015-09-06 (×2): qty 1

## 2015-09-06 MED ORDER — FERROUS SULFATE 325 (65 FE) MG PO TABS
325.0000 mg | ORAL_TABLET | Freq: Every morning | ORAL | Status: DC
  Administered 2015-09-06 – 2015-09-07 (×2): 325 mg via ORAL
  Filled 2015-09-06 (×2): qty 1

## 2015-09-06 MED ORDER — ACETAMINOPHEN 325 MG PO TABS: 650.0000 mg | ORAL_TABLET | Freq: Four times a day (QID) | ORAL | Status: DC | PRN

## 2015-09-06 MED ORDER — METOPROLOL TARTRATE 25 MG PO TABS
12.5000 mg | ORAL_TABLET | Freq: Two times a day (BID) | ORAL | Status: DC
  Administered 2015-09-06 – 2015-09-07 (×3): 12.5 mg via ORAL
  Filled 2015-09-06 (×3): qty 1

## 2015-09-06 MED ORDER — TIOTROPIUM BROMIDE MONOHYDRATE 18 MCG IN CAPS
18.0000 ug | ORAL_CAPSULE | Freq: Every day | RESPIRATORY_TRACT | Status: DC
  Administered 2015-09-06 – 2015-09-08 (×3): 18 ug via RESPIRATORY_TRACT
  Filled 2015-09-06 (×2): qty 5

## 2015-09-06 MED ORDER — MIRTAZAPINE 15 MG PO TABS
7.5000 mg | ORAL_TABLET | Freq: Every day | ORAL | Status: DC
  Administered 2015-09-06 – 2015-09-07 (×3): 7.5 mg via ORAL
  Filled 2015-09-06 (×3): qty 1

## 2015-09-06 MED ORDER — CHOLECALCIFEROL 10 MCG (400 UNIT) PO TABS
400.0000 [IU] | ORAL_TABLET | Freq: Every day | ORAL | Status: DC
  Administered 2015-09-06 – 2015-09-07 (×2): 400 [IU] via ORAL
  Filled 2015-09-06 (×2): qty 1

## 2015-09-06 MED ORDER — POLYETHYLENE GLYCOL 3350 17 G PO PACK
17.0000 g | PACK | Freq: Every day | ORAL | Status: DC
  Filled 2015-09-06: qty 1

## 2015-09-06 MED ORDER — ASPIRIN EC 81 MG PO TBEC
81.0000 mg | DELAYED_RELEASE_TABLET | Freq: Two times a day (BID) | ORAL | Status: DC
  Administered 2015-09-06 – 2015-09-07 (×5): 81 mg via ORAL
  Filled 2015-09-06 (×5): qty 1

## 2015-09-06 NOTE — Consult Note (Signed)
   [facility] CM Inpatient Consult   09/06/2015  EVERLEY EVORA 13-Mar-1934 [id number]   Spoke with patient at bedside regarding [facility] services. Patient states she has a daughter who is a Marine scientist at [employer] and another daughter who lives in [place] and they are both supportive. At this time patient does not want to participate with [facility] at this time but will discuss with her daughters for their input. Patient given [facility] brochure and contact information for future reference.  Of note, [facility] Care Management services would not replace or interfere with any services that are arranged by inpatient case management or social work.  For additional questions or referrals please contact:  Royetta Crochet. [name], RN, BSN, [facility] Liaison [phone]

## 2015-09-06 NOTE — Progress Notes (Signed)
1728 - Patient denies chest pain.  Patient resting in bed.

## 2015-09-06 NOTE — Progress Notes (Signed)
PT Cancellation Note  Patient Details Name: Tina [name] MRN: [id number] DOB: [birth year]   Cancelled Treatment:    Reason Eval/Treat Not Completed: Patient not medically ready.  Per RN, [name], pt c/o chest pain.  Vitals were WNL, however still awaiting EKG.  Will check back to perform initial PT evaluation tomorrow.   [name], PT, DPT X: [phone]   09/06/2015, 3:13 PM

## 2015-09-06 NOTE — Care Management Note (Addendum)
Case Management Note  Patient Details  Name: Tina [name] MRN: [id number] Date of Birth: [birth year]  Subjective/Objective:Spoke with patient for discharge plan, patient stated that she lives at home alone but that her daughter and son in law stay with her at night.  Patient stated that her daughter Tina [name] is a Marine scientist at [employer] and that she could answer questions. Patient stated that she has home O2 but cannot remember the company. Has a walker but stated that she doesn't use it. Patient stated that she still Drives. Home Bound not met. Denies issues with medications or RX fills. PCP is Hilma Favors.      Spoke with patient daughter(Tina [name]) immediately after visiting patient room, Tina [name] stated that she has periods of confusion and has been diagnosed with Dementia. Daughter stated that she has had [facility] in the past and would like to continue with them if needed. Patient does not have home O2 nor does she drive self. Tina [name] stated that she does have family supervision at the home.              Action/Plan: Home with self care.   Expected Discharge Date:                  Expected Discharge Plan:  Home/Self Care  In-House Referral:     Discharge planning Services  CM Consult  Post Acute Care Choice:  NA Choice offered to:     DME Arranged:    DME Agency:     HH Arranged:    HH Agency:     Status of Service:  In process, will continue to follow  Medicare Important Message Given:    Date Medicare IM Given:    Medicare IM give by:    Date Additional Medicare IM Given:    Additional Medicare Important Message give by:     If discussed at [place] of Stay Meetings, dates discussed:    Additional Comments:  Alvie Heidelberg, RN 09/06/2015, 12:11 PM

## 2015-09-06 NOTE — Progress Notes (Signed)
[facility] Progress Note  Patient: Tina [name] STM:[id number]   PCP: Purvis Kilts, MD DOB: [birth year]   DOA: 09/05/2015   DOS: 09/06/2015   Date of Service: the patient was seen and examined on 09/06/2015  Subjective: Patient mentions she is feeling better continues to have shortness of breath as well as cough. Also does of nausea without any episodes of vomiting. No other acute complaints. Nutrition: Minimal oral intake, complains of nausea  Brief hospital course: Patient was admitted on 09/05/2015, with complaint of shortness of breath and abdominal pain, was found to have community acquired pneumonia based on the chest x-ray. Currently further plan is continue treating pneumonia.  Assessment and Plan: 1. Community-acquired pneumonia. Acute respiratory failure with hypoxia.  Suspected COPD exacerbation. Next time patient presented with abdominal pain and was found to have a community-acquired pneumonia on the test x-ray. With leukocytosis the patient has been treated with IV antibiotics. IV Solu Medrol was admitted because of history of COPD and wheezing on admission which currently resolved. We will check blood cultures urine antigens. Add incentive spirometry. Changes on the Medrol to prednisone 30 mg starting tomorrow.  2. Abdominal pain. Nausea.  X-ray abdomen did not show any acute abnormality other than constipation. LFTs are normal. Lipase level is also normal. Urinalysis is unremarkable. With this I would start the patient on stool softeners. Monitor for resolution.  3. Mood disorder. Next and continue Xanax continue Celexa and tinea Remeron.  4. Essential hypertension. Next and continue lisinopril continue metoprolol continue Cardizem.  5. Iron deficiency anemia. Continue iron supplementation.  6. GERD. Continue PPI.  7. Chronic pain management. Next on patient has been on when necessary tramadol. On admission she was placed on scheduled  tramadol. I will change her back to when necessary tramadol.  Pain management: When necessary tramadol, when necessary Tylenol Activity: Consulted physical therapy Bowel regimen: last BM prior to admission Diet: Cardiac diet DVT Prophylaxis: subcutaneous Heparin  Advance goals of care discussion: DNR/DNI  Family Communication: no family was present at bedside, at the time of interview.   Disposition:  Discharge to like the home, with home health Expected discharge date: 09/10/2015 pending improvement in oxygenation  Consultants: none Procedures: none  Antibiotics: Anti-infectives    Start     Dose/Rate Route Frequency Ordered Stop   09/06/15 2000  levofloxacin (LEVAQUIN) IVPB 750 mg     750 mg 100 mL/hr over 90 Minutes Intravenous Every 24 hours 09/06/15 0136     09/06/15 0100  levofloxacin (LEVAQUIN) IVPB 750 mg  Status:  Discontinued     750 mg 100 mL/hr over 90 Minutes Intravenous Every 24 hours 09/06/15 0057 09/06/15 0133   09/05/15 1930  cefTRIAXone (ROCEPHIN) 1 g in dextrose 5 % 50 mL IVPB     1 g 100 mL/hr over 30 Minutes Intravenous  Once 09/05/15 1921 09/05/15 2015   09/05/15 1930  azithromycin (ZITHROMAX) 500 mg in dextrose 5 % 250 mL IVPB     500 mg 250 mL/hr over 60 Minutes Intravenous  Once 09/05/15 1921 09/05/15 2151       No intake or output data in the 24 hours ending 09/06/15 0952 Filed Weights   09/05/15 1706  Weight: 72.576 kg (160 lb)    Objective: Physical Exam: Filed Vitals:   09/06/15 0058 09/06/15 0204 09/06/15 0853 09/06/15 0901  BP:      Pulse:      Temp:      TempSrc:  Resp:      Height:      Weight:      SpO2: 96% 98% 95% 100%    General: Alert, Awake and Oriented to Time, Place and Person. Appear in mild distress Eyes: PERRL, Conjunctiva normal ENT: Oral Mucosa clear moist. Neck: no JVD, no Abnormal Mass Or lumps Cardiovascular: S1 and S2 Present, aortic systolic Murmur, Peripheral Pulses Present Respiratory: Bilateral  Air entry equal and Decreased, bilateral Crackles, no wheezes Abdomen: Bowel Sound present, Soft and no tenderness Skin: no redness, no Rash  Extremities: no Pedal edema, no calf tenderness Neurologic: Grossly no focal neuro deficit. Bilaterally Equal motor strength  Data Reviewed: CBC:  Recent Labs Lab 09/05/15 1745 09/06/15 0611  WBC 13.7* 15.1*  HGB 13.4 12.4  HCT 42.1 38.8  MCV 100.7* 100.3*  PLT 435* 035   Basic Metabolic Panel:  Recent Labs Lab 09/05/15 1745 09/06/15 0611  NA 135 137  K 3.9 3.5  CL 101 102  CO2 24 27  GLUCOSE 146* 106*  BUN 16 12  CREATININE 0.90 0.80  CALCIUM 9.5 9.1    Liver Function Tests:  Recent Labs Lab 09/05/15 1745  AST 29  ALT 15  ALKPHOS 79  BILITOT 0.5  PROT 8.5*  ALBUMIN 4.4    Recent Labs Lab 09/05/15 1745  LIPASE 46   No results for input(s): AMMONIA in the last 168 hours. Coagulation Profile: No results for input(s): INR, PROTIME in the last 168 hours. Cardiac Enzymes:  Recent Labs Lab 09/05/15 1745  TROPONINI 0.03   BNP (last 3 results) No results for input(s): PROBNP in the last 8760 hours.  CBG:  Recent Labs Lab 08/30/15 1248 09/05/15 1941  GLUCAP 153* 189*    Studies: Ct Angio Chest Pe W/cm &/or Wo Cm  09/05/2015  CLINICAL DATA:  Hypoxia, tachycardia. EXAM: CT ANGIOGRAPHY CHEST WITH CONTRAST TECHNIQUE: Multidetector CT imaging of the chest was performed using the standard protocol during bolus administration of intravenous contrast. Multiplanar CT image reconstructions and MIPs were obtained to evaluate the vascular anatomy. CONTRAST:  100 cc Isovue 370 IV COMPARISON:  Radiograph earlier this day. FINDINGS: There are no filling defects within the pulmonary arteries to suggest pulmonary embolus. Evaluation of the right lower lobe pulmonary arteries partially obscured by motion. Ectatic atherosclerotic thoracic aorta without aneurysm or dissection. No periaortic soft tissue stranding. Coronary artery  calcifications. Small AP window lymph node, 6 mm short axis, not enlarged by size criteria. No suspicious mediastinal or hilar adenopathy. No pericardial effusion. Patulous dilated esophagus containing intraluminal ingested contents. Surgical sutures in the right upper lobe, small right apical bleb. Moderate emphysema. Pleural-based calcifications in the right lung most prominent anteriorly. No frank pleural effusion. Patchy and confluent left lower lobe consolidation in the dependent lung consistent with pneumonia. Scattered atelectasis and scarring throughout the right lung, no definite right lung consolidation. Trachea and central airways are patent. No acute abnormality in the included upper abdomen. There are no acute or suspicious osseous abnormalities. Review of the MIP images confirms the above findings. IMPRESSION: 1. No pulmonary embolus. 2. Patchy and confluent left lower lobe consolidation most consistent with pneumonia. Recommend radiographic follow-up to ensure clearing with PA and lateral views in 3-4 weeks. 3. Postsurgical change in the right lung. Right lung pleural calcifications, unchanged from remote chest CT dating back to 2005. Emphysema. 4. Atherosclerosis, including coronary artery calcifications. 5. Patulous mildly dilated esophagus containing ingested material. Electronically Signed   By: Fonnie Birkenhead.D.  On: 09/05/2015 22:46   Dg Abd Acute W/chest  09/05/2015  CLINICAL DATA:  Acute onset of generalized abdominal pain and shortness of breath. Diarrhea. Initial encounter. EXAM: DG ABDOMEN ACUTE W/ 1V CHEST COMPARISON:  Chest radiograph performed 06/23/2014, and CT of the abdomen and pelvis performed 08/04/2010 FINDINGS: The lungs are well-aerated. Vascular congestion is noted. Patchy bibasilar airspace opacities raise concern for pneumonia. The appearance is less typical for pulmonary edema. Persistent right apical scarring is noted, similar in appearance to the prior study, with  associated postoperative change. There is no evidence of pleural effusion or pneumothorax. The cardiomediastinal silhouette is borderline normal in size. The visualized bowel gas pattern is unremarkable. Scattered stool and air are seen within the colon; there is no evidence of small bowel dilatation to suggest obstruction. No free intra-abdominal air is identified on the provided upright view. Clips are noted within the right upper quadrant, reflecting prior cholecystectomy. No acute osseous abnormalities are seen; the sacroiliac joints are unremarkable in appearance. Prominence of the right lesser femoral trochanter is thought to reflect prior injury. The visualized portions of the right femoral intramedullary rod and screw are grossly unremarkable. IMPRESSION: 1. Vascular congestion noted. Patchy bibasilar airspace opacities raise concern for pneumonia. The appearance is less typical for pulmonary edema. 2. Stable appearance to right apical scarring, with associated postoperative change. 3. Unremarkable bowel gas pattern; no free intra-abdominal air seen. Moderate amount of stool noted in the colon. Electronically Signed   By: Garald Balding M.D.   On: 09/05/2015 19:02     Scheduled Meds: . albuterol  2.5 mg Nebulization TID  . allopurinol  100 mg Oral BID  . aspirin EC  81 mg Oral BID  . cholecalciferol  400 Units Oral Daily  . citalopram  20 mg Oral QHS  . diltiazem  180 mg Oral BID  . donepezil  10 mg Oral QHS  . ferrous sulfate  325 mg Oral q morning - 10a  . heparin  5,000 Units Subcutaneous Q8H  . levofloxacin (LEVAQUIN) IV  750 mg Intravenous Q24H  . lisinopril  5 mg Oral Daily  . metoprolol tartrate  12.5 mg Oral BID  . mirtazapine  7.5 mg Oral QHS  . pantoprazole  40 mg Oral Daily  . polyethylene glycol  17 g Oral Daily  . [START ON 09/07/2015] predniSONE  30 mg Oral Q breakfast  . senna-docusate  1 tablet Oral BID  . tiotropium  18 mcg Inhalation Daily  . vitamin B-12  100 mcg Oral  Daily   Continuous Infusions:  PRN Meds: acetaminophen, albuterol, ALPRAZolam, traMADol  Time spent: 30 minutes  Author: Berle Mull, MD [facility] Pager: [phone] 09/06/2015 9:52 AM  If 7PM-7AM, please contact night-coverage at [email or web address], password [facility]

## 2015-09-06 NOTE — Progress Notes (Signed)
73 - RN called to room.  Patient c/o chest pain 6/10 and points mid-sternal.  States pain is sharp.  VS in EPIC and stat EKG ordered.  1522 - Patient states pain is now gone.  Daughter at bedside.  Dr. Posey Pronto notified via text page.

## 2015-09-06 NOTE — Progress Notes (Signed)
[place] for Renal Adjustment of ABX if needed ABX:  Levaquin for CAP  Allergies  Allergen Reactions  . Penicillins Other (See Comments)    Unknown Has patient had a PCN reaction causing immediate rash, facial/tongue/throat swelling, SOB or lightheadedness with hypotension: unknown Has patient had a PCN reaction causing severe rash involving mucus membranes or skin necrosis: unknown Has patient had a PCN reaction that required hospitalization unknown Has patient had a PCN reaction occurring within the last 10 years: unknown If all of the above answers are "NO", then may   Patient Measurements: Height: '5\' 8"'$  (172.7 cm) Weight: 160 lb (72.576 kg) IBW/kg (Calculated) : 63.9  Vital Signs: Temp: 97.8 F (36.6 C) (05/30 1434) Temp Source: Oral (05/30 1434) BP: 144/45 mmHg (05/30 1437) Pulse Rate: 77 (05/30 1437)  Labs:  Recent Labs  09/05/15 1745 09/06/15 0611  WBC 13.7* 15.1*  HGB 13.4 12.4  PLT 435* 391  CREATININE 0.90 0.80   No results for input(s): VANCOTROUGH, VANCOPEAK, VANCORANDOM, GENTTROUGH, GENTPEAK, GENTRANDOM, TOBRATROUGH, TOBRAPEAK, TOBRARND, AMIKACINPEAK, AMIKACINTROU, AMIKACIN in the last 72 hours.   Medical History: Past Medical History  Diagnosis Date  . Lower abdominal pain   . Chronic diarrhea   . Barrett's esophagus   . Irritable bowel syndrome   . Helicobacter pylori (H. pylori)   . Gastritis   . Type 2 diabetes mellitus ([place])   . Essential hypertension, benign   . Sleep apnea     Not on CPAP  . 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 Scleroderma ([place])   . COPD (chronic obstructive pulmonary disease) ([place])   . Raynaud's disease   . Gout   . Stroke [facility])    Assessment: 82yo female.  clcr > 50.    Estimated Creatinine Clearance: 56.6 mL/min (by C-G formula based on Cr of 0.8).  Plan: Continue current Rx:  Levaquin '750mg'$  IV q24hrs Switch to PO when improved / appropriate  Ena Dawley, [facility] 09/06/2015

## 2015-09-07 ENCOUNTER — Inpatient Hospital Stay (HOSPITAL_COMMUNITY): Payer: Medicare Other

## 2015-09-07 DIAGNOSIS — J438 Other emphysema: Secondary | ICD-10-CM

## 2015-09-07 DIAGNOSIS — J189 Pneumonia, unspecified organism: Secondary | ICD-10-CM

## 2015-09-07 DIAGNOSIS — R079 Chest pain, unspecified: Secondary | ICD-10-CM

## 2015-09-07 LAB — CBC WITH DIFFERENTIAL/PLATELET
BASOS PCT: 0 %
Basophils Absolute: 0 10*3/uL (ref 0.0–0.1)
EOS ABS: 0 10*3/uL (ref 0.0–0.7)
EOS PCT: 0 %
HEMATOCRIT: 39.4 % (ref 36.0–46.0)
Hemoglobin: 12.6 g/dL (ref 12.0–15.0)
Lymphocytes Relative: 16 %
Lymphs Abs: 2.1 10*3/uL (ref 0.7–4.0)
MCH: 31.9 pg (ref 26.0–34.0)
MCHC: 32 g/dL (ref 30.0–36.0)
MCV: 99.7 fL (ref 78.0–100.0)
MONO ABS: 0.6 10*3/uL (ref 0.1–1.0)
MONOS PCT: 5 %
Neutro Abs: 10.4 10*3/uL — ABNORMAL HIGH (ref 1.7–7.7)
Neutrophils Relative %: 79 %
PLATELETS: 384 10*3/uL (ref 150–400)
RBC: 3.95 MIL/uL (ref 3.87–5.11)
RDW: 14.4 % (ref 11.5–15.5)
WBC: 13 10*3/uL — ABNORMAL HIGH (ref 4.0–10.5)

## 2015-09-07 LAB — HIV ANTIBODY (ROUTINE TESTING W REFLEX): HIV SCREEN 4TH GENERATION: NONREACTIVE

## 2015-09-07 LAB — BASIC METABOLIC PANEL
Anion gap: 9 (ref 5–15)
BUN: 17 mg/dL (ref 6–20)
CALCIUM: 10.3 mg/dL (ref 8.9–10.3)
CO2: 27 mmol/L (ref 22–32)
CREATININE: 0.74 mg/dL (ref 0.44–1.00)
Chloride: 102 mmol/L (ref 101–111)
Glucose, Bld: 149 mg/dL — ABNORMAL HIGH (ref 65–99)
Potassium: 4.5 mmol/L (ref 3.5–5.1)
SODIUM: 138 mmol/L (ref 135–145)

## 2015-09-07 LAB — TROPONIN I: Troponin I: <0.03 ng/mL (ref <0.031)

## 2015-09-07 MED ORDER — PREDNISONE 10 MG PO TABS: 30.0000 mg | ORAL_TABLET | Freq: Every day | ORAL | Status: DC

## 2015-09-07 MED ORDER — LEVOFLOXACIN 500 MG PO TABS: 500.0000 mg | ORAL_TABLET | Freq: Every day | ORAL | Status: DC

## 2015-09-07 NOTE — Evaluation (Signed)
Physical Therapy Evaluation Patient Details Name: Tina [name] MRN: [id number] DOB: 30-May-1933 Today's Date: 09/07/2015   History of Present Illness  82 yo F admitted with abdominal pain & diarrhea, and found to have PNA.  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  Clinical Impression  Pt received in bed, and had just finished with breakfast, and was agreeable to PT evaluation.  Pt is not a reliable historian, therefore some PLOF may not be accurate.  Pt states that she lives alone, but her dtr and son stay the night every night.  Pt states she is independent with ambulation, and ADL's.  Today, during PT evaluation she required Min guard for sit<>stand, and ambulated 381f with Min HHA +1, with some unsteadiness noted.  Pt demonstrated short term memory loss during evaluation as she had the same conversation 3x's within the course of the PT evaluation.  At this point, she is recommended for 24/7 supervision at home and some HHPT to improve balance and strength.     Follow Up Recommendations Home health PT;Supervision/Assistance - 24 hour    Equipment Recommendations  None recommended by PT    Recommendations for Other Services       Precautions / Restrictions Restrictions Weight Bearing Restrictions: No      Mobility  Bed Mobility Overal bed mobility: Needs Assistance Bed Mobility: Supine to Sit     Supine to sit: Min guard;HOB elevated     General bed mobility comments: HOB completely elevated after finishing breakfast.   Transfers Overall transfer level: Needs assistance   Transfers: Sit to/from Stand Sit to Stand: Supervision;Min guard         General transfer comment: However, upon standing, pt demonstrates high guard position with B UE's, and reaches for the wall and other objects to hold onto when she stood up.    Ambulation/Gait Ambulation/Gait assistance: Min assist Ambulation Distance (Feet): 300 Feet Assistive device: 1 person hand held assist Gait Pattern/deviations: Step-through pattern     General Gait Details: Pt continues to demonstrate arms in high guard position when ambulating even with HHA of 1.  Pt demonstrates slight unsteadiness, which is worsened with head turns and change of direction.   Stairs            Wheelchair Mobility    Modified Rankin (Stroke Patients Only)       Balance Overall balance assessment: Needs assistance         Standing balance support: Single extremity supported Standing balance-Leahy Scale: Fair                               Pertinent Vitals/Pain Pain Assessment: No/denies pain    Home Living   Living Arrangements: Children;Alone (Pt states that her dtr and son stay with her at night only, and that she is alone during the day.  ) Available Help at Discharge: Available PRN/intermittently Type of Home: House Home Access: Stairs to enter   Entrance Stairs-Number of Steps: 1 Home Layout: One level Home Equipment: Grab bars - toilet;Grab bars - tub/shower Additional Comments: Pt is not a reliable historian.  CM notes state that she has supervision at home.  No family present during PT evaluation.    Prior Function     Gait / Transfers Assistance Needed: Pt is independent with ambulation  ADL's /  Homemaking Assistance Needed: Dtr runs errands and does the grocery shopping.  She states she is still driving, however notes from 2015 and on this admission stated that she is not driving.  Pt states she is able to dress herself, and bath herself, however her dtr is present when she is bathing.         Hand Dominance   Dominant Hand: Right    Extremity/Trunk Assessment   Upper Extremity Assessment:  (Noted B hand contractures of all distal carpal joints. )           Lower Extremity Assessment: Generalized  weakness         Communication   Communication: No difficulties  Cognition Arousal/Alertness: Awake/alert Behavior During Therapy: WFL for tasks assessed/performed Overall Cognitive Status: History of cognitive impairments - at baseline Area of Impairment: Orientation;Memory;Awareness;Problem solving Orientation Level: Disoriented to;Time;Situation   Memory: Decreased short-term memory       Problem Solving: Requires verbal cues;Requires tactile cues General Comments: Pt had the same conversation x3's with the PT during the course of the evaluation.  Discussed how 1)her older dtr Langley Gauss, works at [employer] in [place], and 2) How PT was the first girl she has met that is taller than her.  Pt required cues for finding her way back to her room after ambulating in the hallway.     General Comments      Exercises        Assessment/Plan    PT Assessment Patient needs continued PT services  PT Diagnosis Difficulty walking   PT Problem List Decreased strength;Decreased activity tolerance;Decreased balance;Decreased mobility;Decreased cognition;Decreased safety awareness;Decreased knowledge of precautions;Decreased knowledge of use of DME;Cardiopulmonary status limiting activity  PT Treatment Interventions DME instruction;Gait training;Functional mobility training;Therapeutic activities;Therapeutic exercise;Balance training;Cognitive remediation;Patient/family education   PT Goals (Current goals can be found in the Care Plan section) Acute Rehab PT Goals Patient Stated Goal: Pt wants to get stronger, and go home.  PT Goal Formulation: With patient Time For Goal Achievement: 09/14/15 Potential to Achieve Goals: Good    Frequency Min 3X/week   Barriers to discharge Decreased caregiver support      Co-evaluation               End of Session Equipment Utilized During Treatment: Gait belt Activity Tolerance: Patient tolerated treatment well Patient left: in chair;with  call bell/phone within reach Nurse Communication: Mobility status    Functional Assessment Tool Used: [place] "6-clicks" Functional Limitation: Mobility: Walking and moving around Mobility: Walking and Moving Around Current Status [phone]): At least 20 percent but less than 40 percent impaired, limited or restricted Mobility: Walking and Moving Around Goal Status [phone]): At least 1 percent but less than 20 percent impaired, limited or restricted    Time: 0857-0921 PT Time Calculation (min) (ACUTE ONLY): 24 min   Charges:   PT Evaluation $PT Eval Low Complexity: 1 Procedure PT Treatments $Gait Training: 8-22 mins   PT G Codes:   PT G-Codes **NOT FOR INPATIENT CLASS** Functional Assessment Tool Used: [employer]	[place] AM-PAK "6-clicks" Functional Limitation: Mobility: Walking and moving around Mobility: Walking and Moving Around Current Status [phone]): At least 20 percent but less than 40 percent impaired, limited or restricted Mobility: Walking and Moving Around Goal Status [phone]): At least 1 percent but less than 20 percent impaired, limited or restricted    Tacy Learn, PT, DPT X: [id number]   09/07/2015, 10:44 AM

## 2015-09-07 NOTE — Progress Notes (Signed)
PROGRESS NOTE  Tina [name]:[id number] DOB: 1933-07-03 DOA: 09/05/2015 PCP: Purvis Kilts, MD   LOS: 2 days   Brief Narrative: Patient was admitted on 09/05/2015, with complaint of shortness of breath and abdominal pain, was found to have community acquired pneumonia based on the chest x-ray.  Assessment & Plan: Active Problems:   Hypertension   High cholesterol   COPD (chronic obstructive pulmonary disease) ([place])   Pneumonia   CAP (community acquired pneumonia)   Community-acquired pneumonia. - continue antibiotics, transition to po on discharge  Acute respiratory failure with hypoxia. - resolved, on room air this morning   Suspected COPD exacerbation. - continue antibiotics, steroids - improving, no longer wheezing   Essential hypertension.  - continue lisinopril continue metoprolol continue Cardizem.  Iron deficiency anemia. - Continue iron supplementation.  Chest pain on 5/30 - CE negative x 3 - 2D echo not yet done, patient can be discharged pending this test.  GERD. - Continue PPI.  Chronic pain management.  - tramadol   DVT prophylaxis: heparin Code Status: Full Family Communication: no family bedside.  Disposition Plan: home when ready   Consultants:   None   Procedures:   2D echo: pending  Antimicrobials:  Ceftriaxone   Azithromycin    Subjective: - no complaints, wants to go home  Objective: Filed Vitals:   09/06/15 2034 09/06/15 2202 09/07/15 0847 09/07/15 0855  BP:  160/52    Pulse:  89    Temp:  97.7 F (36.5 C)    TempSrc:  Oral    Resp:  14    Height:      Weight:      SpO2: 96% 97% 91% 99%    Intake/Output Summary (Last 24 hours) at 09/07/15 1342 Last data filed at 09/07/15 1251  Gross per 24 hour  Intake    480 ml  Output      0 ml  Net    480 ml   Filed Weights   09/05/15 1706  Weight: 72.576 kg (160 lb)    Examination: Constitutional: NAD Filed Vitals:   09/06/15 2034 09/06/15 2202 09/07/15  0847 09/07/15 0855  BP:  160/52    Pulse:  89    Temp:  97.7 F (36.5 C)    TempSrc:  Oral    Resp:  14    Height:      Weight:      SpO2: 96% 97% 91% 99%   Eyes: PERRL, lids and conjunctivae normal ENMT: Mucous membranes are moist. No oropharyngeal exudates Neck: normal, supple, no masses, no thyromegaly Respiratory: clear to auscultation bilaterally, no wheezing, no crackles. Normal respiratory effort. No accessory muscle use.  Cardiovascular: Regular rate and rhythm, no murmurs / rubs / gallops. No LE edema. 2+ pedal pulses. No carotid bruits.  Abdomen: no tenderness. Bowel sounds positive.  Musculoskeletal: no clubbing / cyanosis. No joint deformity upper and lower extremities. No contractures. Normal muscle tone.  Skin: no rashes, lesions, ulcers. No induration Neurologic: CN 2-12 grossly intact. Strength 5/5 in all 4.  Psychiatric: Normal judgment and insight. Alert and oriented x 3. Normal mood.    Data Reviewed: I have personally reviewed following labs and imaging studies  CBC:  Recent Labs Lab 09/05/15 1745 09/06/15 0611 09/07/15 0333  WBC 13.7* 15.1* 13.0*  NEUTROABS  --   --  10.4*  HGB 13.4 12.4 12.6  HCT 42.1 38.8 39.4  MCV 100.7* 100.3* 99.7  PLT 435* 391 350   Basic Metabolic  Panel:  Recent Labs Lab 09/05/15 1745 09/06/15 0611 09/07/15 0333  NA 135 137 138  K 3.9 3.5 4.5  CL 101 102 102  CO2 '24 27 27  '$ GLUCOSE 146* 106* 149*  BUN '16 12 17  '$ CREATININE 0.90 0.80 0.74  CALCIUM 9.5 9.1 10.3   GFR: Estimated Creatinine Clearance: 56.6 mL/min (by C-G formula based on Cr of 0.74). Liver Function Tests:  Recent Labs Lab 09/05/15 1745  AST 29  ALT 15  ALKPHOS 79  BILITOT 0.5  PROT 8.5*  ALBUMIN 4.4    Recent Labs Lab 09/05/15 1745  LIPASE 46   No results for input(s): AMMONIA in the last 168 hours. Coagulation Profile: No results for input(s): INR, PROTIME in the last 168 hours. Cardiac Enzymes:  Recent Labs Lab 09/05/15 1745  09/06/15 1535 09/06/15 2148 09/07/15 0333  TROPONINI 0.03 <0.03 <0.03 <0.03   BNP (last 3 results) No results for input(s): PROBNP in the last 8760 hours. HbA1C: No results for input(s): HGBA1C in the last 72 hours. CBG:  Recent Labs Lab 09/05/15 1941 09/06/15 2146  GLUCAP 189* 191*   Lipid Profile: No results for input(s): CHOL, HDL, LDLCALC, TRIG, CHOLHDL, LDLDIRECT in the last 72 hours. Thyroid Function Tests: No results for input(s): TSH, T4TOTAL, FREET4, T3FREE, THYROIDAB in the last 72 hours. Anemia Panel: No results for input(s): VITAMINB12, FOLATE, FERRITIN, TIBC, IRON, RETICCTPCT in the last 72 hours. Urine analysis:    Component Value Date/Time   COLORURINE YELLOW 09/05/2015 1750   APPEARANCEUR CLEAR 09/05/2015 1750   LABSPEC 1.020 09/05/2015 1750   PHURINE 5.5 09/05/2015 1750   GLUCOSEU NEGATIVE 09/05/2015 1750   HGBUR NEGATIVE 09/05/2015 1750   BILIRUBINUR NEGATIVE 09/05/2015 1750   KETONESUR NEGATIVE 09/05/2015 1750   PROTEINUR NEGATIVE 09/05/2015 1750   UROBILINOGEN 0.2 04/25/2014 2025   NITRITE NEGATIVE 09/05/2015 1750   LEUKOCYTESUR NEGATIVE 09/05/2015 1750   Sepsis Labs: Invalid input(s): PROCALCITONIN, LACTICIDVEN  Recent Results (from the past 240 hour(s))  Urine culture     Status: Abnormal   Collection Time: 08/30/15 10:02 AM  Result Value Ref Range Status   Specimen Description URINE, CLEAN CATCH  Final   Special Requests NONE  Final   Culture MULTIPLE SPECIES PRESENT, SUGGEST RECOLLECTION (A)  Final   Report Status 08/31/2015 FINAL  Final  Culture, blood (routine x 2) Call MD if unable to obtain prior to antibiotics being given     Status: None (Preliminary result)   Collection Time: 09/06/15  3:20 PM  Result Value Ref Range Status   Specimen Description BLOOD LEFT FOREARM  Final   Special Requests BOTTLES DRAWN AEROBIC AND ANAEROBIC 10CC EACH  Final   Culture NO GROWTH < 24 HOURS  Final   Report Status PENDING  Incomplete  Culture,  blood (routine x 2) Call MD if unable to obtain prior to antibiotics being given     Status: None (Preliminary result)   Collection Time: 09/06/15  3:35 PM  Result Value Ref Range Status   Specimen Description BLOOD LEFT FOREARM  Final   Special Requests BOTTLES DRAWN AEROBIC ONLY 5CC  Final   Culture NO GROWTH < 24 HOURS  Final   Report Status PENDING  Incomplete      Radiology Studies: Ct Angio Chest Pe W/cm &/or Wo Cm  09/05/2015  CLINICAL DATA:  Hypoxia, tachycardia. EXAM: CT ANGIOGRAPHY CHEST WITH CONTRAST TECHNIQUE: Multidetector CT imaging of the chest was performed using the standard protocol during bolus administration of intravenous contrast.  Multiplanar CT image reconstructions and MIPs were obtained to evaluate the vascular anatomy. CONTRAST:  100 cc Isovue 370 IV COMPARISON:  Radiograph earlier this day. FINDINGS: There are no filling defects within the pulmonary arteries to suggest pulmonary embolus. Evaluation of the right lower lobe pulmonary arteries partially obscured by motion. Ectatic atherosclerotic thoracic aorta without aneurysm or dissection. No periaortic soft tissue stranding. Coronary artery calcifications. Small AP window lymph node, 6 mm short axis, not enlarged by size criteria. No suspicious mediastinal or hilar adenopathy. No pericardial effusion. Patulous dilated esophagus containing intraluminal ingested contents. Surgical sutures in the right upper lobe, small right apical bleb. Moderate emphysema. Pleural-based calcifications in the right lung most prominent anteriorly. No frank pleural effusion. Patchy and confluent left lower lobe consolidation in the dependent lung consistent with pneumonia. Scattered atelectasis and scarring throughout the right lung, no definite right lung consolidation. Trachea and central airways are patent. No acute abnormality in the included upper abdomen. There are no acute or suspicious osseous abnormalities. Review of the MIP images  confirms the above findings. IMPRESSION: 1. No pulmonary embolus. 2. Patchy and confluent left lower lobe consolidation most consistent with pneumonia. Recommend radiographic follow-up to ensure clearing with PA and lateral views in 3-4 weeks. 3. Postsurgical change in the right lung. Right lung pleural calcifications, unchanged from remote chest CT dating back to 2005. Emphysema. 4. Atherosclerosis, including coronary artery calcifications. 5. Patulous mildly dilated esophagus containing ingested material. Electronically Signed   By: Jeb Levering M.D.   On: 09/05/2015 22:46   Dg Abd Acute W/chest  09/05/2015  CLINICAL DATA:  Acute onset of generalized abdominal pain and shortness of breath. Diarrhea. Initial encounter. EXAM: DG ABDOMEN ACUTE W/ 1V CHEST COMPARISON:  Chest radiograph performed 06/23/2014, and CT of the abdomen and pelvis performed 08/04/2010 FINDINGS: The lungs are well-aerated. Vascular congestion is noted. Patchy bibasilar airspace opacities raise concern for pneumonia. The appearance is less typical for pulmonary edema. Persistent right apical scarring is noted, similar in appearance to the prior study, with associated postoperative change. There is no evidence of pleural effusion or pneumothorax. The cardiomediastinal silhouette is borderline normal in size. The visualized bowel gas pattern is unremarkable. Scattered stool and air are seen within the colon; there is no evidence of small bowel dilatation to suggest obstruction. No free intra-abdominal air is identified on the provided upright view. Clips are noted within the right upper quadrant, reflecting prior cholecystectomy. No acute osseous abnormalities are seen; the sacroiliac joints are unremarkable in appearance. Prominence of the right lesser femoral trochanter is thought to reflect prior injury. The visualized portions of the right femoral intramedullary rod and screw are grossly unremarkable. IMPRESSION: 1. Vascular congestion  noted. Patchy bibasilar airspace opacities raise concern for pneumonia. The appearance is less typical for pulmonary edema. 2. Stable appearance to right apical scarring, with associated postoperative change. 3. Unremarkable bowel gas pattern; no free intra-abdominal air seen. Moderate amount of stool noted in the colon. Electronically Signed   By: Garald Balding M.D.   On: 09/05/2015 19:02     Scheduled Meds: . albuterol  2.5 mg Nebulization TID  . allopurinol  100 mg Oral BID  . aspirin EC  81 mg Oral BID  . cholecalciferol  400 Units Oral Daily  . citalopram  20 mg Oral QHS  . donepezil  10 mg Oral QHS  . ferrous sulfate  325 mg Oral q morning - 10a  . heparin  5,000 Units Subcutaneous Q8H  . levofloxacin (  LEVAQUIN) IV  750 mg Intravenous Q24H  . metoprolol tartrate  12.5 mg Oral BID  . mirtazapine  7.5 mg Oral QHS  . pantoprazole  40 mg Oral Daily  . polyethylene glycol  17 g Oral Daily  . predniSONE  30 mg Oral Q breakfast  . senna-docusate  1 tablet Oral BID  . tiotropium  18 mcg Inhalation Daily  . vitamin B-12  100 mcg Oral Daily   Continuous Infusions:    Marzetta Board, MD, PhD [facility] Pager [phone]  If 7PM-7AM, please contact night-coverage [email or web address] Password [email or web address] 09/07/2015, 1:42 PM

## 2015-09-08 DIAGNOSIS — I10 Essential (primary) hypertension: Secondary | ICD-10-CM

## 2015-09-08 DIAGNOSIS — R079 Chest pain, unspecified: Secondary | ICD-10-CM

## 2015-09-08 LAB — ECHOCARDIOGRAM COMPLETE
HEIGHTINCHES: 68 in
WEIGHTICAEL: 2560 [oz_av]

## 2015-09-08 LAB — HEMOGLOBIN A1C
HEMOGLOBIN A1C: 6.2 % — AB (ref 4.8–5.6)
MEAN PLASMA GLUCOSE: 131 mg/dL

## 2015-09-08 NOTE — Discharge Instructions (Signed)
Follow with Purvis Kilts, MD in 5-7 days  Please get a complete blood count and chemistry panel checked by your Primary MD at your next visit, and again as instructed by your Primary MD. Please get your medications reviewed and adjusted by your Primary MD.  Please request your Primary MD to go over all Hospital Tests and Procedure/Radiological results at the follow up, please get all Hospital records sent to your Prim MD by signing hospital release before you go home.  If you had Pneumonia of Lung problems at the Hospital: Please get a 2 view Chest X ray done in 6-8 weeks after hospital discharge or sooner if instructed by your Primary MD.  If you have Congestive Heart Failure: Please call your Cardiologist or Primary MD anytime you have any of the following symptoms:  1) 3 pound weight gain in 24 hours or 5 pounds in 1 week  2) shortness of breath, with or without a dry hacking cough  3) swelling in the hands, feet or stomach  4) if you have to sleep on extra pillows at night in order to breathe  Follow cardiac low salt diet and 1.5 lit/day fluid restriction.  If you have diabetes Accuchecks 4 times/day, Once in AM empty stomach and then before each meal. Log in all results and show them to your primary doctor at your next visit. If any glucose reading is under 80 or above 300 call your primary MD immediately.  If you have Seizure/Convulsions/Epilepsy: Please do not drive, operate heavy machinery, participate in activities at heights or participate in high speed sports until you have seen by Primary MD or a Neurologist and advised to do so again.  If you had Gastrointestinal Bleeding: Please ask your Primary MD to check a complete blood count within one week of discharge or at your next visit. Your endoscopic/colonoscopic biopsies that are pending at the time of discharge, will also need to followed by your Primary MD.  Get Medicines reviewed and adjusted. Please take all your  medications with you for your next visit with your Primary MD  Please request your Primary MD to go over all hospital tests and procedure/radiological results at the follow up, please ask your Primary MD to get all Hospital records sent to his/her office.  If you experience worsening of your admission symptoms, develop shortness of breath, life threatening emergency, suicidal or homicidal thoughts you must seek medical attention immediately by calling 911 or calling your MD immediately  if symptoms less severe.  You must read complete instructions/literature along with all the possible adverse reactions/side effects for all the Medicines you take and that have been prescribed to you. Take any new Medicines after you have completely understood and accpet all the possible adverse reactions/side effects.   Do not drive or operate heavy machinery when taking Pain medications.   Do not take more than prescribed Pain, Sleep and Anxiety Medications  Special Instructions: If you have smoked or chewed Tobacco  in the last 2 yrs please stop smoking, stop any regular Alcohol  and or any Recreational drug use.  Wear Seat belts while driving.  Please note You were cared for by a hospitalist during your hospital stay. If you have any questions about your discharge medications or the care you received while you were in the hospital after you are discharged, you can call the unit and asked to speak with the hospitalist on call if the hospitalist that took care of you is not available.  Once you are discharged, your primary care physician will handle any further medical issues. Please note that NO REFILLS for any discharge medications will be authorized once you are discharged, as it is imperative that you return to your primary care physician (or establish a relationship with a primary care physician if you do not have one) for your aftercare needs so that they can reassess your need for medications and monitor your  lab values.  You can reach the hospitalist office at phone [phone] or fax [phone]   If you do not have a primary care physician, you can call [phone] for a physician referral.  Activity: As tolerated with Full fall precautions use walker/cane & assistance as needed  Diet: regular  Disposition Home

## 2015-09-08 NOTE — Progress Notes (Signed)
Patient and family state understanding of discharge instructions, prescriptions given.

## 2015-09-08 NOTE — Discharge Summary (Signed)
Physician Discharge Summary  Tina [name] QZE:[id number] DOB: 12-Jun-1933 DOA: 09/05/2015  PCP: Purvis Kilts, MD  Admit date: 09/05/2015 Discharge date: 09/08/2015  Time spent: > 30 minutes  Recommendations for Outpatient Follow-up:  1. Follow up with PCP in 1-2 weeks 2. Levaquin and Prednisone taper on d/c   Discharge Diagnoses:  Active Problems:   Hypertension   High cholesterol   COPD (chronic obstructive pulmonary disease) (HCC)   Pneumonia   CAP (community acquired pneumonia)  Discharge Condition: stable  Diet recommendation: regular  Filed Weights   09/05/15 1706  Weight: 72.576 kg (160 lb)    History of present illness:  See H&P, Labs, Consult and Test reports for all details in brief, patient is a 82 y.o. female with medical history significant of COPD, DM Type 2, and Barrett's esophagus presented with complaints of abdominal pain and mild diarrhea. Patient is mildly confused and unable to give a complete history. Family said she has been coughing for a few days. She was given supplemental oxygen and IV Rocephin and ZIthromax given. Due to her hypoxia, hospitalist was asked to admit her for further Tx.   ED Course: Evaluation in the ED, CAP revealed on CXR. Wbc 13.7; BNP 208.0; mild hypoxia. UA negative  Hospital Course:  Community-acquired pneumonia and acute hypoxic respiratory failure with hypoxia / COPD exacerbation - patient was admitted to the hospital, started on antibiotics, steroids and scheduled breathing treatments. She improved and was able to be weaned off to room air, able to ambulate with PT without significant dyspnea. Her antibiotics and steroids were converted to po and is to complete treatment as an outpatient. She was discharged home in stable condition.  Essential hypertension - continue lisinopril continue metoprolol continue Cardizem. Iron deficiency anemia - Continue iron supplementation. Chest pain on 5/30 - CE negative x 3, 2D echo  with normal EF and grade 1 DD, no WMA, resolved.Suspect related to #1. GERD - Continue PPI. Chronic pain management - tramadol  Procedures:  2D echo Study Conclusions - Left ventricle: The cavity size was normal. Wall thickness was increased in a pattern of mild LVH. Systolic function was normal. The estimated ejection fraction was in the range of 60% to 65%. Wall motion was normal; there were no regional wall motion abnormalities. Doppler parameters are consistent with abnormal left ventricular relaxation (grade 1 diastolic dysfunction). Doppler parameters are consistent with high ventricular filling pressure. - Aortic valve: Valve area (VTI): 2.13 cm^2. Valve area (Vmax): 2.13 cm^2. - Left atrium: The atrium was mildly dilated. - Atrial septum: No defect or patent foramen ovale was identified. - Pulmonary arteries: Systolic pressure was mildly increased. PA peak pressure: 36 mm Hg (S). - Technically adequate study.   Consultations:  None   Discharge Exam: Filed Vitals:   09/07/15 2142 09/07/15 2300 09/08/15 0449 09/08/15 0818  BP:  141/45 129/59   Pulse:  93 41   Temp:  97.7 F (36.5 C) 98.3 F (36.8 C)   TempSrc:  Oral Oral   Resp:  20 17   Height:      Weight:      SpO2: 93% 95% 98% 93%    General: NAD Cardiovascular: RRR Respiratory: CTA biL  Discharge Instructions Activity:  As tolerated   Get Medicines reviewed and adjusted: Please take all your medications with you for your next visit with your Primary MD  Please request your Primary MD to go over all hospital tests and procedure/radiological results at the follow up,  please ask your Primary MD to get all Hospital records sent to his/her office.  If you experience worsening of your admission symptoms, develop shortness of breath, life threatening emergency, suicidal or homicidal thoughts you must seek medical attention immediately by calling 911 or calling your MD immediately if symptoms less severe.  You  must read complete instructions/literature along with all the possible adverse reactions/side effects for all the Medicines you take and that have been prescribed to you. Take any new Medicines after you have completely understood and accpet all the possible adverse reactions/side effects.   Do not drive when taking Pain medications.   Do not take more than prescribed Pain, Sleep and Anxiety Medications  Special Instructions: If you have smoked or chewed Tobacco in the last 2 yrs please stop smoking, stop any regular Alcohol and or any Recreational drug use.  Wear Seat belts while driving.  Please note  You were cared for by a hospitalist during your hospital stay. Once you are discharged, your primary care physician will handle any further medical issues. Please note that NO REFILLS for any discharge medications will be authorized once you are discharged, as it is imperative that you return to your primary care physician (or establish a relationship with a primary care physician if you do not have one) for your aftercare needs so that they can reassess your need for medications and monitor your lab values.    Medication List    STOP taking these medications        zolpidem 10 MG tablet  Commonly known as:  AMBIEN      TAKE these medications        allopurinol 100 MG tablet  Commonly known as:  ZYLOPRIM  Take 100 mg by mouth 2 (two) times daily.     ALPRAZolam 0.25 MG tablet  Commonly known as:  XANAX  Take 1 tablet (0.25 mg total) by mouth 3 (three) times daily as needed for anxiety.     aspirin EC 81 MG tablet  Take 81 mg by mouth 2 (two) times daily.     BEVESPI AEROSPHERE 9-4.8 MCG/ACT Aero  Generic drug:  Glycopyrrolate-Formoterol  Inhale 2 puffs into the lungs 2 (two) times daily.     CARTIA XT 180 MG 24 hr capsule  Generic drug:  diltiazem  Take 180 mg by mouth 2 (two) times daily.     citalopram 20 MG tablet  Commonly known as:  CELEXA  Take 20 mg by mouth at  bedtime.     donepezil 10 MG tablet  Commonly known as:  ARICEPT  Take 10 mg by mouth at bedtime.     ferrous sulfate 325 (65 FE) MG tablet  Take 325 mg by mouth every morning.     hydrochlorothiazide 25 MG tablet  Commonly known as:  HYDRODIURIL  Take 1 tablet (25 mg total) by mouth every morning. Resume on 11/13/13.     levofloxacin 500 MG tablet  Commonly known as:  LEVAQUIN  Take 1 tablet (500 mg total) by mouth daily.     lisinopril 5 MG tablet  Commonly known as:  PRINIVIL,ZESTRIL  Take 5 mg by mouth daily.     Menthol (Topical Analgesic) 154 MG Pads  Apply 1 each topically daily as needed (for arthritis pain to right arm-shoulder).     metoprolol tartrate 25 MG tablet  Commonly known as:  LOPRESSOR  Take 12.5 mg by mouth 2 (two) times daily.     mirtazapine 15  MG tablet  Commonly known as:  REMERON  Take 7.5 mg by mouth at bedtime.     omeprazole 40 MG capsule  Commonly known as:  PRILOSEC  Take 40 mg by mouth every evening.     predniSONE 10 MG tablet  Commonly known as:  DELTASONE  Take 3 tablets (30 mg total) by mouth daily with breakfast. 30 mg x 2 days then 20 mg x 2 days then 10 mg x 1 days     SPIRIVA HANDIHALER 18 MCG inhalation capsule  Generic drug:  tiotropium  USE 1 CAPSULE VIA HANDIHALER ONCE DAILY     traMADol 50 MG tablet  Commonly known as:  ULTRAM  Take 1 tablet (50 mg total) by mouth every 6 (six) hours as needed.     vitamin B-12 100 MCG tablet  Commonly known as:  CYANOCOBALAMIN  Take 100 mcg by mouth daily.     VITAMIN D PO  Take 1 tablet by mouth every morning.           Follow-up Information    Follow up with Purvis Kilts, MD On 09/15/2015.   Specialty:  Family Medicine   Why:  @ 3:15   Contact information:   [address] [place] [address] [phone]       The results of significant diagnostics from this hospitalization (including imaging, microbiology, ancillary and laboratory) are listed below for  reference.    Significant Diagnostic Studies: Ct Angio Chest Pe W/cm &/or Wo Cm  09/05/2015  CLINICAL DATA:  Hypoxia, tachycardia. EXAM: CT ANGIOGRAPHY CHEST WITH CONTRAST TECHNIQUE: Multidetector CT imaging of the chest was performed using the standard protocol during bolus administration of intravenous contrast. Multiplanar CT image reconstructions and MIPs were obtained to evaluate the vascular anatomy. CONTRAST:  100 cc Isovue 370 IV COMPARISON:  Radiograph earlier this day. FINDINGS: There are no filling defects within the pulmonary arteries to suggest pulmonary embolus. Evaluation of the right lower lobe pulmonary arteries partially obscured by motion. Ectatic atherosclerotic thoracic aorta without aneurysm or dissection. No periaortic soft tissue stranding. Coronary artery calcifications. Small AP window lymph node, 6 mm short axis, not enlarged by size criteria. No suspicious mediastinal or hilar adenopathy. No pericardial effusion. Patulous dilated esophagus containing intraluminal ingested contents. Surgical sutures in the right upper lobe, small right apical bleb. Moderate emphysema. Pleural-based calcifications in the right lung most prominent anteriorly. No frank pleural effusion. Patchy and confluent left lower lobe consolidation in the dependent lung consistent with pneumonia. Scattered atelectasis and scarring throughout the right lung, no definite right lung consolidation. Trachea and central airways are patent. No acute abnormality in the included upper abdomen. There are no acute or suspicious osseous abnormalities. Review of the MIP images confirms the above findings. IMPRESSION: 1. No pulmonary embolus. 2. Patchy and confluent left lower lobe consolidation most consistent with pneumonia. Recommend radiographic follow-up to ensure clearing with PA and lateral views in 3-4 weeks. 3. Postsurgical change in the right lung. Right lung pleural calcifications, unchanged from remote chest CT dating  back to 2005. Emphysema. 4. Atherosclerosis, including coronary artery calcifications. 5. Patulous mildly dilated esophagus containing ingested material. Electronically Signed   By: Jeb Levering M.D.   On: 09/05/2015 22:46   Dg Abd Acute W/chest  09/05/2015  CLINICAL DATA:  Acute onset of generalized abdominal pain and shortness of breath. Diarrhea. Initial encounter. EXAM: DG ABDOMEN ACUTE W/ 1V CHEST COMPARISON:  Chest radiograph performed 06/23/2014, and CT of the abdomen and pelvis  performed 08/04/2010 FINDINGS: The lungs are well-aerated. Vascular congestion is noted. Patchy bibasilar airspace opacities raise concern for pneumonia. The appearance is less typical for pulmonary edema. Persistent right apical scarring is noted, similar in appearance to the prior study, with associated postoperative change. There is no evidence of pleural effusion or pneumothorax. The cardiomediastinal silhouette is borderline normal in size. The visualized bowel gas pattern is unremarkable. Scattered stool and air are seen within the colon; there is no evidence of small bowel dilatation to suggest obstruction. No free intra-abdominal air is identified on the provided upright view. Clips are noted within the right upper quadrant, reflecting prior cholecystectomy. No acute osseous abnormalities are seen; the sacroiliac joints are unremarkable in appearance. Prominence of the right lesser femoral trochanter is thought to reflect prior injury. The visualized portions of the right femoral intramedullary rod and screw are grossly unremarkable. IMPRESSION: 1. Vascular congestion noted. Patchy bibasilar airspace opacities raise concern for pneumonia. The appearance is less typical for pulmonary edema. 2. Stable appearance to right apical scarring, with associated postoperative change. 3. Unremarkable bowel gas pattern; no free intra-abdominal air seen. Moderate amount of stool noted in the colon. Electronically Signed   By: Garald Balding M.D.   On: 09/05/2015 19:02    Microbiology: Recent Results (from the past 240 hour(s))  Urine culture     Status: Abnormal   Collection Time: 08/30/15 10:02 AM  Result Value Ref Range Status   Specimen Description URINE, CLEAN CATCH  Final   Special Requests NONE  Final   Culture MULTIPLE SPECIES PRESENT, SUGGEST RECOLLECTION (A)  Final   Report Status 08/31/2015 FINAL  Final  Culture, blood (routine x 2) Call MD if unable to obtain prior to antibiotics being given     Status: None (Preliminary result)   Collection Time: 09/06/15  3:20 PM  Result Value Ref Range Status   Specimen Description BLOOD LEFT FOREARM  Final   Special Requests BOTTLES DRAWN AEROBIC AND ANAEROBIC 10CC EACH  Final   Culture NO GROWTH 2 DAYS  Final   Report Status PENDING  Incomplete  Culture, blood (routine x 2) Call MD if unable to obtain prior to antibiotics being given     Status: None (Preliminary result)   Collection Time: 09/06/15  3:35 PM  Result Value Ref Range Status   Specimen Description BLOOD LEFT FOREARM  Final   Special Requests BOTTLES DRAWN AEROBIC ONLY 5CC  Final   Culture NO GROWTH 2 DAYS  Final   Report Status PENDING  Incomplete     Labs: Basic Metabolic Panel:  Recent Labs Lab 09/05/15 1745 09/06/15 0611 09/07/15 0333  NA 135 137 138  K 3.9 3.5 4.5  CL 101 102 102  CO2 '24 27 27  '$ GLUCOSE 146* 106* 149*  BUN '16 12 17  '$ CREATININE 0.90 0.80 0.74  CALCIUM 9.5 9.1 10.3   Liver Function Tests:  Recent Labs Lab 09/05/15 1745  AST 29  ALT 15  ALKPHOS 79  BILITOT 0.5  PROT 8.5*  ALBUMIN 4.4    Recent Labs Lab 09/05/15 1745  LIPASE 46   No results for input(s): AMMONIA in the last 168 hours. CBC:  Recent Labs Lab 09/05/15 1745 09/06/15 0611 09/07/15 0333  WBC 13.7* 15.1* 13.0*  NEUTROABS  --   --  10.4*  HGB 13.4 12.4 12.6  HCT 42.1 38.8 39.4  MCV 100.7* 100.3* 99.7  PLT 435* 391 384   Cardiac Enzymes:  Recent Labs Lab 09/05/15 1745  09/06/15 1535 09/06/15 2148 09/07/15 0333  TROPONINI 0.03 <0.03 <0.03 <0.03   BNP: BNP (last 3 results)  Recent Labs  09/05/15 1745  BNP 208.0*    ProBNP (last 3 results) No results for input(s): PROBNP in the last 8760 hours.  CBG:  Recent Labs Lab 09/05/15 1941 09/06/15 2146  GLUCAP 189* 191*       Signed:  Marzetta Board  [facility] 09/08/2015, 11:21 AM

## 2015-09-11 LAB — CULTURE, BLOOD (ROUTINE X 2)
CULTURE: NO GROWTH
Culture: NO GROWTH

## 2015-09-13 DIAGNOSIS — I7300 Raynaud's syndrome without gangrene: Secondary | ICD-10-CM | POA: Diagnosis not present

## 2015-09-13 DIAGNOSIS — E119 Type 2 diabetes mellitus without complications: Secondary | ICD-10-CM | POA: Diagnosis not present

## 2015-09-13 DIAGNOSIS — J440 Chronic obstructive pulmonary disease with acute lower respiratory infection: Secondary | ICD-10-CM | POA: Diagnosis not present

## 2015-09-13 DIAGNOSIS — J189 Pneumonia, unspecified organism: Secondary | ICD-10-CM | POA: Diagnosis not present

## 2015-09-13 DIAGNOSIS — D509 Iron deficiency anemia, unspecified: Secondary | ICD-10-CM | POA: Diagnosis not present

## 2015-09-13 DIAGNOSIS — M109 Gout, unspecified: Secondary | ICD-10-CM | POA: Diagnosis not present

## 2015-09-13 DIAGNOSIS — I10 Essential (primary) hypertension: Secondary | ICD-10-CM | POA: Diagnosis not present

## 2015-09-13 DIAGNOSIS — J441 Chronic obstructive pulmonary disease with (acute) exacerbation: Secondary | ICD-10-CM | POA: Diagnosis not present

## 2015-09-15 DIAGNOSIS — E782 Mixed hyperlipidemia: Secondary | ICD-10-CM | POA: Diagnosis not present

## 2015-09-15 DIAGNOSIS — J189 Pneumonia, unspecified organism: Secondary | ICD-10-CM | POA: Diagnosis not present

## 2015-09-15 DIAGNOSIS — J449 Chronic obstructive pulmonary disease, unspecified: Secondary | ICD-10-CM | POA: Diagnosis not present

## 2015-09-15 DIAGNOSIS — J9690 Respiratory failure, unspecified, unspecified whether with hypoxia or hypercapnia: Secondary | ICD-10-CM | POA: Diagnosis not present

## 2015-09-15 DIAGNOSIS — Z6824 Body mass index (BMI) 24.0-24.9, adult: Secondary | ICD-10-CM | POA: Diagnosis not present

## 2015-09-15 DIAGNOSIS — Z1389 Encounter for screening for other disorder: Secondary | ICD-10-CM | POA: Diagnosis not present

## 2015-09-15 DIAGNOSIS — E119 Type 2 diabetes mellitus without complications: Secondary | ICD-10-CM | POA: Diagnosis not present

## 2015-09-19 DIAGNOSIS — I10 Essential (primary) hypertension: Secondary | ICD-10-CM | POA: Diagnosis not present

## 2015-09-19 DIAGNOSIS — E119 Type 2 diabetes mellitus without complications: Secondary | ICD-10-CM | POA: Diagnosis not present

## 2015-09-19 DIAGNOSIS — J441 Chronic obstructive pulmonary disease with (acute) exacerbation: Secondary | ICD-10-CM | POA: Diagnosis not present

## 2015-09-19 DIAGNOSIS — J189 Pneumonia, unspecified organism: Secondary | ICD-10-CM | POA: Diagnosis not present

## 2015-09-19 DIAGNOSIS — J440 Chronic obstructive pulmonary disease with acute lower respiratory infection: Secondary | ICD-10-CM | POA: Diagnosis not present

## 2015-09-19 DIAGNOSIS — D509 Iron deficiency anemia, unspecified: Secondary | ICD-10-CM | POA: Diagnosis not present

## 2015-09-22 DIAGNOSIS — D509 Iron deficiency anemia, unspecified: Secondary | ICD-10-CM | POA: Diagnosis not present

## 2015-09-22 DIAGNOSIS — J441 Chronic obstructive pulmonary disease with (acute) exacerbation: Secondary | ICD-10-CM | POA: Diagnosis not present

## 2015-09-22 DIAGNOSIS — E119 Type 2 diabetes mellitus without complications: Secondary | ICD-10-CM | POA: Diagnosis not present

## 2015-09-22 DIAGNOSIS — I10 Essential (primary) hypertension: Secondary | ICD-10-CM | POA: Diagnosis not present

## 2015-09-22 DIAGNOSIS — J440 Chronic obstructive pulmonary disease with acute lower respiratory infection: Secondary | ICD-10-CM | POA: Diagnosis not present

## 2015-09-22 DIAGNOSIS — J189 Pneumonia, unspecified organism: Secondary | ICD-10-CM | POA: Diagnosis not present

## 2015-09-26 DIAGNOSIS — J189 Pneumonia, unspecified organism: Secondary | ICD-10-CM | POA: Diagnosis not present

## 2015-09-26 DIAGNOSIS — E119 Type 2 diabetes mellitus without complications: Secondary | ICD-10-CM | POA: Diagnosis not present

## 2015-09-26 DIAGNOSIS — D509 Iron deficiency anemia, unspecified: Secondary | ICD-10-CM | POA: Diagnosis not present

## 2015-09-26 DIAGNOSIS — J441 Chronic obstructive pulmonary disease with (acute) exacerbation: Secondary | ICD-10-CM | POA: Diagnosis not present

## 2015-09-26 DIAGNOSIS — I10 Essential (primary) hypertension: Secondary | ICD-10-CM | POA: Diagnosis not present

## 2015-09-26 DIAGNOSIS — J440 Chronic obstructive pulmonary disease with acute lower respiratory infection: Secondary | ICD-10-CM | POA: Diagnosis not present

## 2015-09-28 DIAGNOSIS — J189 Pneumonia, unspecified organism: Secondary | ICD-10-CM | POA: Diagnosis not present

## 2015-09-28 DIAGNOSIS — J441 Chronic obstructive pulmonary disease with (acute) exacerbation: Secondary | ICD-10-CM | POA: Diagnosis not present

## 2015-09-28 DIAGNOSIS — E119 Type 2 diabetes mellitus without complications: Secondary | ICD-10-CM | POA: Diagnosis not present

## 2015-09-28 DIAGNOSIS — D509 Iron deficiency anemia, unspecified: Secondary | ICD-10-CM | POA: Diagnosis not present

## 2015-09-28 DIAGNOSIS — I10 Essential (primary) hypertension: Secondary | ICD-10-CM | POA: Diagnosis not present

## 2015-09-28 DIAGNOSIS — J440 Chronic obstructive pulmonary disease with acute lower respiratory infection: Secondary | ICD-10-CM | POA: Diagnosis not present

## 2015-09-30 DIAGNOSIS — D509 Iron deficiency anemia, unspecified: Secondary | ICD-10-CM | POA: Diagnosis not present

## 2015-09-30 DIAGNOSIS — I10 Essential (primary) hypertension: Secondary | ICD-10-CM | POA: Diagnosis not present

## 2015-09-30 DIAGNOSIS — J440 Chronic obstructive pulmonary disease with acute lower respiratory infection: Secondary | ICD-10-CM | POA: Diagnosis not present

## 2015-09-30 DIAGNOSIS — E119 Type 2 diabetes mellitus without complications: Secondary | ICD-10-CM | POA: Diagnosis not present

## 2015-09-30 DIAGNOSIS — J189 Pneumonia, unspecified organism: Secondary | ICD-10-CM | POA: Diagnosis not present

## 2015-09-30 DIAGNOSIS — J441 Chronic obstructive pulmonary disease with (acute) exacerbation: Secondary | ICD-10-CM | POA: Diagnosis not present

## 2015-10-03 DIAGNOSIS — J440 Chronic obstructive pulmonary disease with acute lower respiratory infection: Secondary | ICD-10-CM | POA: Diagnosis not present

## 2015-10-03 DIAGNOSIS — E119 Type 2 diabetes mellitus without complications: Secondary | ICD-10-CM | POA: Diagnosis not present

## 2015-10-03 DIAGNOSIS — D509 Iron deficiency anemia, unspecified: Secondary | ICD-10-CM | POA: Diagnosis not present

## 2015-10-03 DIAGNOSIS — J189 Pneumonia, unspecified organism: Secondary | ICD-10-CM | POA: Diagnosis not present

## 2015-10-03 DIAGNOSIS — J441 Chronic obstructive pulmonary disease with (acute) exacerbation: Secondary | ICD-10-CM | POA: Diagnosis not present

## 2015-10-03 DIAGNOSIS — I10 Essential (primary) hypertension: Secondary | ICD-10-CM | POA: Diagnosis not present

## 2015-10-05 DIAGNOSIS — J440 Chronic obstructive pulmonary disease with acute lower respiratory infection: Secondary | ICD-10-CM | POA: Diagnosis not present

## 2015-10-05 DIAGNOSIS — I10 Essential (primary) hypertension: Secondary | ICD-10-CM | POA: Diagnosis not present

## 2015-10-05 DIAGNOSIS — D509 Iron deficiency anemia, unspecified: Secondary | ICD-10-CM | POA: Diagnosis not present

## 2015-10-05 DIAGNOSIS — E119 Type 2 diabetes mellitus without complications: Secondary | ICD-10-CM | POA: Diagnosis not present

## 2015-10-05 DIAGNOSIS — J441 Chronic obstructive pulmonary disease with (acute) exacerbation: Secondary | ICD-10-CM | POA: Diagnosis not present

## 2015-10-05 DIAGNOSIS — J189 Pneumonia, unspecified organism: Secondary | ICD-10-CM | POA: Diagnosis not present

## 2015-10-06 DIAGNOSIS — E119 Type 2 diabetes mellitus without complications: Secondary | ICD-10-CM | POA: Diagnosis not present

## 2015-10-06 DIAGNOSIS — D509 Iron deficiency anemia, unspecified: Secondary | ICD-10-CM | POA: Diagnosis not present

## 2015-10-06 DIAGNOSIS — I10 Essential (primary) hypertension: Secondary | ICD-10-CM | POA: Diagnosis not present

## 2015-10-06 DIAGNOSIS — J189 Pneumonia, unspecified organism: Secondary | ICD-10-CM | POA: Diagnosis not present

## 2015-10-06 DIAGNOSIS — J441 Chronic obstructive pulmonary disease with (acute) exacerbation: Secondary | ICD-10-CM | POA: Diagnosis not present

## 2015-10-06 DIAGNOSIS — J440 Chronic obstructive pulmonary disease with acute lower respiratory infection: Secondary | ICD-10-CM | POA: Diagnosis not present

## 2015-10-12 DIAGNOSIS — E119 Type 2 diabetes mellitus without complications: Secondary | ICD-10-CM | POA: Diagnosis not present

## 2015-10-12 DIAGNOSIS — J440 Chronic obstructive pulmonary disease with acute lower respiratory infection: Secondary | ICD-10-CM | POA: Diagnosis not present

## 2015-10-12 DIAGNOSIS — D509 Iron deficiency anemia, unspecified: Secondary | ICD-10-CM | POA: Diagnosis not present

## 2015-10-12 DIAGNOSIS — J189 Pneumonia, unspecified organism: Secondary | ICD-10-CM | POA: Diagnosis not present

## 2015-10-12 DIAGNOSIS — J441 Chronic obstructive pulmonary disease with (acute) exacerbation: Secondary | ICD-10-CM | POA: Diagnosis not present

## 2015-10-12 DIAGNOSIS — I10 Essential (primary) hypertension: Secondary | ICD-10-CM | POA: Diagnosis not present

## 2015-10-17 DIAGNOSIS — E119 Type 2 diabetes mellitus without complications: Secondary | ICD-10-CM | POA: Diagnosis not present

## 2015-10-17 DIAGNOSIS — J440 Chronic obstructive pulmonary disease with acute lower respiratory infection: Secondary | ICD-10-CM | POA: Diagnosis not present

## 2015-10-17 DIAGNOSIS — J189 Pneumonia, unspecified organism: Secondary | ICD-10-CM | POA: Diagnosis not present

## 2015-10-17 DIAGNOSIS — I10 Essential (primary) hypertension: Secondary | ICD-10-CM | POA: Diagnosis not present

## 2015-10-17 DIAGNOSIS — J441 Chronic obstructive pulmonary disease with (acute) exacerbation: Secondary | ICD-10-CM | POA: Diagnosis not present

## 2015-10-17 DIAGNOSIS — D509 Iron deficiency anemia, unspecified: Secondary | ICD-10-CM | POA: Diagnosis not present

## 2015-10-19 DIAGNOSIS — N281 Cyst of kidney, acquired: Secondary | ICD-10-CM | POA: Diagnosis not present

## 2015-10-19 DIAGNOSIS — R319 Hematuria, unspecified: Secondary | ICD-10-CM | POA: Diagnosis not present

## 2015-10-20 DIAGNOSIS — N3021 Other chronic cystitis with hematuria: Secondary | ICD-10-CM | POA: Diagnosis not present

## 2015-10-21 DIAGNOSIS — E119 Type 2 diabetes mellitus without complications: Secondary | ICD-10-CM | POA: Diagnosis not present

## 2015-10-21 DIAGNOSIS — D509 Iron deficiency anemia, unspecified: Secondary | ICD-10-CM | POA: Diagnosis not present

## 2015-10-21 DIAGNOSIS — J440 Chronic obstructive pulmonary disease with acute lower respiratory infection: Secondary | ICD-10-CM | POA: Diagnosis not present

## 2015-10-21 DIAGNOSIS — J441 Chronic obstructive pulmonary disease with (acute) exacerbation: Secondary | ICD-10-CM | POA: Diagnosis not present

## 2015-10-21 DIAGNOSIS — J189 Pneumonia, unspecified organism: Secondary | ICD-10-CM | POA: Diagnosis not present

## 2015-10-21 DIAGNOSIS — I10 Essential (primary) hypertension: Secondary | ICD-10-CM | POA: Diagnosis not present

## 2015-10-24 ENCOUNTER — Other Ambulatory Visit: Payer: Self-pay | Admitting: Orthopedic Surgery

## 2015-10-27 ENCOUNTER — Other Ambulatory Visit: Payer: Self-pay | Admitting: *Deleted

## 2015-10-27 MED ORDER — TRAMADOL HCL 50 MG PO TABS
50.0000 mg | ORAL_TABLET | Freq: Four times a day (QID) | ORAL | Status: DC | PRN
Start: 2015-10-27 — End: 2015-11-02

## 2015-11-01 ENCOUNTER — Inpatient Hospital Stay (HOSPITAL_COMMUNITY)
Admission: EM | Admit: 2015-11-01 | Discharge: 2015-11-04 | DRG: 381 | Disposition: A | Discharge status: Home Health | Payer: Medicare Other | Attending: Internal Medicine | Admitting: Internal Medicine

## 2015-11-01 ENCOUNTER — Encounter (HOSPITAL_COMMUNITY): Payer: Self-pay | Admitting: *Deleted

## 2015-11-01 DIAGNOSIS — Z808 Family history of malignant neoplasm of other organs or systems: Secondary | ICD-10-CM

## 2015-11-01 DIAGNOSIS — R12 Heartburn: Secondary | ICD-10-CM | POA: Diagnosis not present

## 2015-11-01 DIAGNOSIS — I959 Hypotension, unspecified: Secondary | ICD-10-CM

## 2015-11-01 DIAGNOSIS — M109 Gout, unspecified: Secondary | ICD-10-CM | POA: Diagnosis present

## 2015-11-01 DIAGNOSIS — Z7982 Long term (current) use of aspirin: Secondary | ICD-10-CM

## 2015-11-01 DIAGNOSIS — J449 Chronic obstructive pulmonary disease, unspecified: Secondary | ICD-10-CM | POA: Diagnosis present

## 2015-11-01 DIAGNOSIS — K2970 Gastritis, unspecified, without bleeding: Secondary | ICD-10-CM | POA: Diagnosis not present

## 2015-11-01 DIAGNOSIS — K219 Gastro-esophageal reflux disease without esophagitis: Secondary | ICD-10-CM | POA: Diagnosis not present

## 2015-11-01 DIAGNOSIS — K920 Hematemesis: Secondary | ICD-10-CM | POA: Diagnosis not present

## 2015-11-01 DIAGNOSIS — E119 Type 2 diabetes mellitus without complications: Secondary | ICD-10-CM

## 2015-11-01 DIAGNOSIS — Z7951 Long term (current) use of inhaled steroids: Secondary | ICD-10-CM

## 2015-11-01 DIAGNOSIS — R1319 Other dysphagia: Secondary | ICD-10-CM | POA: Diagnosis present

## 2015-11-01 DIAGNOSIS — K580 Irritable bowel syndrome with diarrhea: Secondary | ICD-10-CM | POA: Diagnosis present

## 2015-11-01 DIAGNOSIS — D62 Acute posthemorrhagic anemia: Secondary | ICD-10-CM | POA: Diagnosis present

## 2015-11-01 DIAGNOSIS — R1111 Vomiting without nausea: Secondary | ICD-10-CM | POA: Diagnosis not present

## 2015-11-01 DIAGNOSIS — D500 Iron deficiency anemia secondary to blood loss (chronic): Secondary | ICD-10-CM | POA: Diagnosis present

## 2015-11-01 DIAGNOSIS — K2211 Ulcer of esophagus with bleeding: Principal | ICD-10-CM | POA: Diagnosis present

## 2015-11-01 DIAGNOSIS — Z87891 Personal history of nicotine dependence: Secondary | ICD-10-CM

## 2015-11-01 DIAGNOSIS — K2270 Barrett's esophagus without dysplasia: Secondary | ICD-10-CM | POA: Diagnosis present

## 2015-11-01 DIAGNOSIS — R1314 Dysphagia, pharyngoesophageal phase: Secondary | ICD-10-CM | POA: Diagnosis not present

## 2015-11-01 DIAGNOSIS — Z8673 Personal history of transient ischemic attack (TIA), and cerebral infarction without residual deficits: Secondary | ICD-10-CM

## 2015-11-01 DIAGNOSIS — I10 Essential (primary) hypertension: Secondary | ICD-10-CM | POA: Diagnosis not present

## 2015-11-01 DIAGNOSIS — Z66 Do not resuscitate: Secondary | ICD-10-CM | POA: Diagnosis present

## 2015-11-01 DIAGNOSIS — K922 Gastrointestinal hemorrhage, unspecified: Secondary | ICD-10-CM

## 2015-11-01 DIAGNOSIS — Z825 Family history of asthma and other chronic lower respiratory diseases: Secondary | ICD-10-CM

## 2015-11-01 DIAGNOSIS — K210 Gastro-esophageal reflux disease with esophagitis: Secondary | ICD-10-CM | POA: Diagnosis present

## 2015-11-01 DIAGNOSIS — R1310 Dysphagia, unspecified: Secondary | ICD-10-CM | POA: Diagnosis present

## 2015-11-01 DIAGNOSIS — G4730 Sleep apnea, unspecified: Secondary | ICD-10-CM | POA: Diagnosis present

## 2015-11-01 DIAGNOSIS — R0789 Other chest pain: Secondary | ICD-10-CM | POA: Diagnosis not present

## 2015-11-01 DIAGNOSIS — Z7984 Long term (current) use of oral hypoglycemic drugs: Secondary | ICD-10-CM

## 2015-11-01 LAB — COMPREHENSIVE METABOLIC PANEL
ALK PHOS: 74 U/L (ref 38–126)
ALT: 13 U/L — AB (ref 14–54)
AST: 23 U/L (ref 15–41)
Albumin: 4.9 g/dL (ref 3.5–5.0)
Anion gap: 9 (ref 5–15)
BUN: 12 mg/dL (ref 6–20)
CALCIUM: 9.7 mg/dL (ref 8.9–10.3)
CHLORIDE: 102 mmol/L (ref 101–111)
CO2: 23 mmol/L (ref 22–32)
CREATININE: 0.88 mg/dL (ref 0.44–1.00)
GFR calc Af Amer: >60 mL/min (ref >60)
Glucose, Bld: 196 mg/dL — ABNORMAL HIGH (ref 65–99)
Potassium: 4.1 mmol/L (ref 3.5–5.1)
Sodium: 134 mmol/L — ABNORMAL LOW (ref 135–145)
Total Bilirubin: 0.7 mg/dL (ref 0.3–1.2)
Total Protein: 8.3 g/dL — ABNORMAL HIGH (ref 6.5–8.1)

## 2015-11-01 LAB — TYPE AND SCREEN
ABO/RH(D): A POS
Antibody Screen: NEGATIVE

## 2015-11-01 LAB — CBC
HCT: 36 % (ref 36.0–46.0)
Hemoglobin: 11.6 g/dL — ABNORMAL LOW (ref 12.0–15.0)
MCH: 32.4 pg (ref 26.0–34.0)
MCHC: 32.2 g/dL (ref 30.0–36.0)
MCV: 100.6 fL — AB (ref 78.0–100.0)
PLATELETS: 365 10*3/uL (ref 150–400)
RBC: 3.58 MIL/uL — ABNORMAL LOW (ref 3.87–5.11)
RDW: 15.5 % (ref 11.5–15.5)
WBC: 18.5 10*3/uL — ABNORMAL HIGH (ref 4.0–10.5)

## 2015-11-01 LAB — LIPASE, BLOOD: LIPASE: 33 U/L (ref 11–51)

## 2015-11-01 LAB — OCCULT BLOOD GASTRIC / DUODENUM (SPECIMEN CUP)
Occult Blood, Gastric: POSITIVE — AB
pH, Gastric: 1

## 2015-11-01 LAB — TROPONIN I: Troponin I: <0.03 ng/mL (ref <0.03)

## 2015-11-01 MED ORDER — PANTOPRAZOLE SODIUM 40 MG IV SOLR: 40.0000 mg | Freq: Two times a day (BID) | INTRAVENOUS | Status: DC

## 2015-11-01 MED ORDER — SODIUM CHLORIDE 0.9 % IV SOLN
8.0000 mg/h | INTRAVENOUS | Status: DC
  Administered 2015-11-01 – 2015-11-04 (×6): 8 mg/h via INTRAVENOUS
  Filled 2015-11-01 (×9): qty 80

## 2015-11-01 MED ORDER — ONDANSETRON HCL 4 MG/2ML IJ SOLN
4.0000 mg | Freq: Once | INTRAMUSCULAR | Status: AC
  Administered 2015-11-01: 4 mg via INTRAVENOUS
  Filled 2015-11-01: qty 2

## 2015-11-01 MED ORDER — PANTOPRAZOLE SODIUM 40 MG IV SOLR
40.0000 mg | Freq: Once | INTRAVENOUS | Status: AC
  Administered 2015-11-01: 40 mg via INTRAVENOUS
  Filled 2015-11-01: qty 40

## 2015-11-01 MED ORDER — SODIUM CHLORIDE 0.9 % IV SOLN
1000.0000 mL | Freq: Once | INTRAVENOUS | Status: AC
  Administered 2015-11-01: 1000 mL via INTRAVENOUS

## 2015-11-01 MED ORDER — PANTOPRAZOLE SODIUM 40 MG IV SOLR
INTRAVENOUS | Status: AC
  Filled 2015-11-01: qty 80

## 2015-11-01 NOTE — ED Notes (Signed)
Patient's family at nursing station stating that patient had to urinate and was vomiting. Patient actively throwing up when entering room.  Placed patient on bed pan patient urinated and had stool. Unable to get clean catch at this time.

## 2015-11-01 NOTE — ED Notes (Signed)
Pt has episode of diarrhea on bedpan (checked hemocult and stool was negative).  Pt then suddenly vomits 500+cc frank red blood.  EDP notified and to the bedside.  Pt is alert and oriented.  VSS.  Continues to have belching and epigastic pain and nausea

## 2015-11-01 NOTE — H&P (Signed)
History and Physical    Tina [name] VOZ:[id number] DOB: [birth year] DOA: 11/01/2015  Referring MD/NP/PA: [name], MD  PCP: Purvis Kilts, MD Outpatient Specialists:  Patient coming from: Home   Chief Complaint: Hematemesis.  HPI: Tina [name] is a 82 y.o. female with medical history significant of Barrnett's esophagus, COPD, essential HTN presented with complaints of hematemesis x2 that onset this evening. She also reports associated epigastric pain described as aching and gradually worsening with indigestion, nausea and diarrhea. Per notes patient was recently on prednisone and ASA. She denies additional NSAID use. Daughter bedside reports that blood was found in her urine one week ago and she was seen by urology, who suspected early onset UTI. She was given antibiotic to take should she has symptoms, but she didn't. No black stool, no bloody stool.    ED Course: CMP unremarkable. WBC 18.5, gastric occult blood positive. She was also noted to have SBP of 77. Responded to IVF to 104.  Her BUN was normal.  Due to concerns for UGIB and hypotension she has been referred for admission.      Review of Systems: As per HPI otherwise 10 point review of systems negative.    Past Medical History:  Diagnosis Date  . Barrett's esophagus   . Chronic diarrhea   . COPD (chronic obstructive pulmonary disease) ([place])   . Essential hypertension, benign   . Gastritis   . Gout   . Helicobacter pylori (H. pylori)   . Irritable bowel syndrome   . Lower abdominal pain   . Raynaud's disease   . 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 Scleroderma ([place])   . Sleep apnea    Not on CPAP  . Stroke ([place])   . Type 2 diabetes mellitus ([place])     Past Surgical History:  Procedure Laterality Date  . BACK SURGERY    . CHOLECYSTECTOMY    . COLONOSCOPY  12/2008  . ESOPHAGOGASTRODUODENOSCOPY  06/09  . ESOPHAGOGASTRODUODENOSCOPY  02/02/05   EGD ED  . ESOPHAGOGASTRODUODENOSCOPY  03/08/00   EGD ED  .  INTRAMEDULLARY (IM) NAIL INTERTROCHANTERIC Right 11/04/2013   Procedure: INTERNAL FIXATION RIGHT HIP WITH GAMMA NAIL;  Surgeon: Carole Civil, MD;  Location: [facility];  Service: Orthopedics;  Laterality: Right;  . LUNG SURGERY  2002   Lung collapsed  . PATELLA FRACTURE SURGERY       reports that she has quit smoking. Her smoking use included Cigarettes. She has never used smokeless tobacco. She reports that she does not drink alcohol or use drugs.    Family History  Problem Relation Age of Onset  . Asthma Father   . Melanoma Brother     Prior to Admission medications   Medication Sig Start Date End Date Taking? Authorizing Provider  acetaminophen (TYLENOL) 500 MG tablet Take 500 mg by mouth 2 (two) times daily.   Yes Historical Provider, MD  allopurinol (ZYLOPRIM) 100 MG tablet Take 100 mg by mouth 2 (two) times daily.    Yes Historical Provider, MD  ALPRAZolam (XANAX) 0.25 MG tablet Take 1 tablet (0.25 mg total) by mouth 3 (three) times daily as needed for anxiety. Patient taking differently: Take 0.25 mg by mouth 3 (three) times daily as needed for anxiety or sleep.  11/06/13  Yes Radene Gunning, NP  aspirin EC 81 MG tablet Take 81 mg by mouth 2 (two) times daily.    Yes Historical Provider, MD  Cholecalciferol (VITAMIN D PO) Take 1 tablet by  mouth every morning.   Yes Historical Provider, MD  citalopram (CELEXA) 20 MG tablet Take 20 mg by mouth at bedtime.    Yes Historical Provider, MD  diltiazem (CARTIA XT) 180 MG 24 hr capsule Take 180 mg by mouth 2 (two) times daily.   Yes Historical Provider, MD  donepezil (ARICEPT) 10 MG tablet Take 10 mg by mouth at bedtime.   Yes Historical Provider, MD  ferrous sulfate 325 (65 FE) MG tablet Take 325 mg by mouth every morning.   Yes Historical Provider, MD  Glycopyrrolate-Formoterol (BEVESPI AEROSPHERE) 9-4.8 MCG/ACT AERO Inhale 2 puffs into the lungs 2 (two) times daily.   Yes Historical Provider, MD  hydrochlorothiazide (HYDRODIURIL) 25 MG  tablet Take 1 tablet (25 mg total) by mouth every morning. Resume on 11/13/13. 11/06/13  Yes Lezlie Octave Black, NP  lisinopril (PRINIVIL,ZESTRIL) 5 MG tablet Take 5 mg by mouth daily.   Yes Historical Provider, MD  Menthol, Topical Analgesic, 154 MG PADS Apply 1 each topically daily as needed (for arthritis pain to right arm-shoulder).   Yes Historical Provider, MD  metoprolol tartrate (LOPRESSOR) 25 MG tablet Take 12.5 mg by mouth 2 (two) times daily.    Yes Historical Provider, MD  mirtazapine (REMERON) 15 MG tablet Take 7.5 mg by mouth at bedtime.   Yes Historical Provider, MD  nitroGLYCERIN (NITROSTAT) 0.4 MG SL tablet Place 0.4 mg under the tongue every 5 (five) minutes as needed for chest pain.   Yes Historical Provider, MD  omeprazole (PRILOSEC) 20 MG capsule Take 20 mg by mouth daily.   Yes Historical Provider, MD  SPIRIVA HANDIHALER 18 MCG inhalation capsule USE 1 CAPSULE VIA HANDIHALER ONCE DAILY 07/20/15  Yes Historical Provider, MD  vitamin B-12 (CYANOCOBALAMIN) 250 MCG tablet Take 250 mcg by mouth daily.    Yes Historical Provider, MD  vitamin E 400 UNIT capsule Take 400 Units by mouth daily.   Yes Historical Provider, MD  levofloxacin (LEVAQUIN) 500 MG tablet Take 1 tablet (500 mg total) by mouth daily. Patient not taking: Reported on 11/01/2015 09/07/15   Caren Griffins, MD  nitrofurantoin, macrocrystal-monohydrate, (MACROBID) 100 MG capsule Take 100 mg by mouth 2 (two) times daily. 10 day course starting on 10/23/2015 10/23/15   Historical Provider, MD  predniSONE (DELTASONE) 10 MG tablet Take 3 tablets (30 mg total) by mouth daily with breakfast. 30 mg x 2 days then 20 mg x 2 days then 10 mg x 1 days Patient not taking: Reported on 11/01/2015 09/07/15   Caren Griffins, MD  traMADol (ULTRAM) 50 MG tablet Take 1 tablet (50 mg total) by mouth every 6 (six) hours as needed. Patient not taking: Reported on 11/01/2015 10/27/15   Carole Civil, MD    Physical Exam: Vitals:   11/01/15 2222  11/01/15 2230 11/01/15 2311  BP: 129/94 104/76 99/78  Pulse: 83  84  Resp:  17   Temp: 98.1 F (36.7 C)    TempSrc: Oral    SpO2: 99%  98%      Constitutional: NAD, calm, comfortable Vitals:   11/01/15 2222 11/01/15 2230 11/01/15 2311  BP: 129/94 104/76 99/78  Pulse: 83  84  Resp:  17   Temp: 98.1 F (36.7 C)    TempSrc: Oral    SpO2: 99%  98%   Eyes: PERRL, lids and conjunctivae normal ENMT: Mucous membranes are moist. Posterior pharynx clear of any exudate or lesions.Normal dentition.  Neck: normal, supple, no masses, no thyromegaly Respiratory: clear  to auscultation bilaterally, no wheezing, no crackles. Normal respiratory effort. No accessory muscle use.  Cardiovascular: Regular rate and rhythm, no murmurs / rubs / gallops. No extremity edema. Abdomen: no tenderness, no masses palpated. No hepatosplenomegaly. Bowel sounds positive.  Musculoskeletal: no clubbing / cyanosis. No joint deformity upper and lower extremities.   Skin: no rashes, lesions, ulcers. No induration Neurologic: CN 2-12 grossly intact. Sensation intact, DTR normal. .  Psychiatric: Normal judgment and insight. Alert and oriented x 3. Normal mood.     Labs on Admission: I have personally reviewed following labs and imaging studies  CBC:  Recent Labs Lab 11/01/15 2230  WBC 18.5*  HGB 11.6*  HCT 36.0  MCV 100.6*  PLT 287   Basic Metabolic Panel:  Recent Labs Lab 11/01/15 2230  NA 134*  K 4.1  CL 102  CO2 23  GLUCOSE 196*  BUN 12  CREATININE 0.88  CALCIUM 9.7   GFR: CrCl cannot be calculated (Unknown ideal weight.). Liver Function Tests:  Recent Labs Lab 11/01/15 2230  AST 23  ALT 13*  ALKPHOS 74  BILITOT 0.7  PROT 8.3*  ALBUMIN 4.9    Recent Labs Lab 11/01/15 2230  LIPASE 33   No results for input(s): AMMONIA in the last 168 hours. Coagulation Profile: No results for input(s): INR, PROTIME in the last 168 hours. Cardiac Enzymes:  Recent Labs Lab 11/01/15 2230   TROPONINI <0.03   Urine analysis:    Component Value Date/Time   COLORURINE YELLOW 09/05/2015 1750   APPEARANCEUR CLEAR 09/05/2015 1750   LABSPEC 1.020 09/05/2015 1750   PHURINE 5.5 09/05/2015 1750   GLUCOSEU NEGATIVE 09/05/2015 1750   HGBUR NEGATIVE 09/05/2015 1750   BILIRUBINUR NEGATIVE 09/05/2015 1750   KETONESUR NEGATIVE 09/05/2015 1750   PROTEINUR NEGATIVE 09/05/2015 1750   UROBILINOGEN 0.2 04/25/2014 2025   NITRITE NEGATIVE 09/05/2015 1750   LEUKOCYTESUR NEGATIVE 09/05/2015 1750    Assessment/Plan  1. UGIB:  Though she has normal BUN, stable Hb, and could be having MW tear, I am also concerned about ulcer, as she was on Prednisone with ASA.  Will therefore admitted her to the ICU, d/c ASA and start IV Protonix drip. GI has been consulted by EDP. Will likely need to have an EGD tomorrow.  Type and Screen, hold all anti HTN meds and diuretics.  IV access, and check H and H every 4 hours.  Permission for blood transfusion if required was given with R and B explained.  2. Nausea, vomiting.  Continue antiemetics.  3. Essential HTN, patient hypotensive on admission which resolved with x2 bolus. Antihypertensives held. Provided bolus in the ED.  4. Esophageal dysphagia,  5. DM Type 2. Hold oral agents. Started on SSI.  NPO.      DVT prophylaxis: SCD Code Status: DNR Family Communication: Daughter and Son-in-law bedside.  Disposition Plan: Anticipate discharge home once improved.  Consults called: Gastroenterology  Admission status: Inpatient, ICU.    Orvan Falconer,  MD FACP.  [facility] If 7PM-7AM, please contact night-coverage [email or web address] Password [email or web address]  11/01/2015, 11:59 PM   By signing my name below, I, Rennis Harding, attest that this documentation has been prepared under the direction and in the presence of Orvan Falconer, MD. Electronically signed: Rennis Harding, Scribe. 11/02/15 12:10am    I, Dr. Orvan Falconer, personally performed the services described in this  documentation. All medical record entries made by the scribe were at my discretion and in my presence. Orvan Falconer, MD 11/01/15

## 2015-11-01 NOTE — ED Notes (Addendum)
Gastrocult Positive (checked emesis with gastroccult gard)

## 2015-11-01 NOTE — ED Triage Notes (Signed)
EMS responded to home where pt had epigastric pain and belching and reported "heartburn".  EKG was WNL.  Pt then vomited large amount of liquid (thin and reddish per ems).  No sob with this.  Pt arrives still feeling nauseated, belching and having some dry heaves.  CBG was 182 for ems.  IV in her left forearm.

## 2015-11-01 NOTE — ED Provider Notes (Signed)
[place] DEPT Provider Note   CSN: [id number] Arrival date & time: 11/01/15  2154  First Provider Contact:  23:03 PM   By signing my name below, I, Tina [name], attest that this documentation has been prepared under the direction and in the presence of Tina Porter, MD . Electronically Signed: Higinio [name], Scribe. 11/01/2015. 11:13 PM.  History   Chief Complaint Chief Complaint  Patient presents with  . Hematemesis   HPI Comments: Tina [name] is a 82 y.o. female with PMHx of Barrett's esophagus, gastritis, and IBS, who presents to the Emergency Department complaining of 2 episodes of hematemesis that occurred upon arrival to the ED. She notes this is the first time she has ever vomited blood; she states she initially believed it was tomato juice since she recently drank it this evening. Pt also reports gradually worsening, "aching," epigastric abdominal pain and indigestion that began earlier today and worsened this evening. She notes associated nausea and diarrhea earlier today and feeling of "heartburn" that begins in her stomach. Per nurse, EMS was initially dispatched with concerns of heart problems; however, when they arrived, pt described her symptoms as GERD and "a gallon of vomit." She had been advised to take 4 baby aspirin by 911 which she did about 30 minutes before she started vomiting blood. Pt denies taking any new medications recently. She reports she lives by herself; though, her daughter comes to regularly stay the night with her. She states she had been nauseated all day. She denies abdominal pain. She describes she is having epigastric pain now radiating into her chest that she now describes as aching. Nurse reports she's had diarrhea 3 since she's in the ED however it was Hemoccult negative.  PCP Dr Hilma Favors GI Dr Laural Golden  The history is provided by the patient. No language interpreter was used.    Past Medical History:  Diagnosis Date  . Barrett's esophagus   .  Chronic diarrhea   . COPD (chronic obstructive pulmonary disease) ([place])   . Essential hypertension, benign   . Gastritis   . Gout   . Helicobacter pylori (H. pylori)   . Irritable bowel syndrome   . Lower abdominal pain   . Raynaud's disease   . 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 Scleroderma ([place])   . Sleep apnea    Not on CPAP  . Stroke ([place])   . Type 2 diabetes mellitus [facility])     Patient Active Problem List   Diagnosis Date Noted  . Upper GI bleeding 11/02/2015  . UGIB (upper gastrointestinal bleed) 11/02/2015  . Pneumonia 09/05/2015  . CAP (community acquired pneumonia) 09/05/2015  . Intertrochanteric fracture of right femur ([place]) 11/04/2013  . Preoperative cardiovascular examination 11/03/2013  . Fall at home 11/02/2013  . Hip fracture, right ([place]) 11/02/2013  . Closed right hip fracture ([place]) 11/02/2013  . Chest pain 05/05/2013  . HTN (hypertension) 05/05/2013  . DM (diabetes mellitus) ([place]) 05/05/2013  . Hypertension 08/01/2011  . High cholesterol 08/01/2011  . Gout 08/01/2011  . Raynaud's disease 08/01/2011  . COPD (chronic obstructive pulmonary disease) ([place]) 08/01/2011  . Barrett's esophagus 08/01/2011  . Esophageal dysphagia 08/01/2011    Past Surgical History:  Procedure Laterality Date  . BACK SURGERY    . CHOLECYSTECTOMY    . COLONOSCOPY  12/2008  . ESOPHAGOGASTRODUODENOSCOPY  06/09  . ESOPHAGOGASTRODUODENOSCOPY  02/02/05   EGD ED  . ESOPHAGOGASTRODUODENOSCOPY  03/08/00   EGD ED  . INTRAMEDULLARY (IM) NAIL INTERTROCHANTERIC Right 11/04/2013  Procedure: INTERNAL FIXATION RIGHT HIP WITH GAMMA NAIL;  Surgeon: Carole Civil, MD;  Location: [facility];  Service: Orthopedics;  Laterality: Right;  . LUNG SURGERY  2002   Lung collapsed  . PATELLA FRACTURE SURGERY      OB History    No data available       Home Medications    Prior to Admission medications   Medication Sig Start Date End Date Taking? Authorizing Provider  acetaminophen (TYLENOL) 500 MG  tablet Take 500 mg by mouth 2 (two) times daily.   Yes Historical Provider, MD  allopurinol (ZYLOPRIM) 100 MG tablet Take 100 mg by mouth 2 (two) times daily.    Yes Historical Provider, MD  ALPRAZolam (XANAX) 0.25 MG tablet Take 1 tablet (0.25 mg total) by mouth 3 (three) times daily as needed for anxiety. Patient taking differently: Take 0.25 mg by mouth 3 (three) times daily as needed for anxiety or sleep.  11/06/13  Yes Radene Gunning, NP  aspirin EC 81 MG tablet Take 81 mg by mouth 2 (two) times daily.    Yes Historical Provider, MD  Cholecalciferol (VITAMIN D PO) Take 1 tablet by mouth every morning.   Yes Historical Provider, MD  citalopram (CELEXA) 20 MG tablet Take 20 mg by mouth at bedtime.    Yes Historical Provider, MD  diltiazem (CARTIA XT) 180 MG 24 hr capsule Take 180 mg by mouth 2 (two) times daily.   Yes Historical Provider, MD  donepezil (ARICEPT) 10 MG tablet Take 10 mg by mouth at bedtime.   Yes Historical Provider, MD  ferrous sulfate 325 (65 FE) MG tablet Take 325 mg by mouth every morning.   Yes Historical Provider, MD  Glycopyrrolate-Formoterol (BEVESPI AEROSPHERE) 9-4.8 MCG/ACT AERO Inhale 2 puffs into the lungs 2 (two) times daily.   Yes Historical Provider, MD  hydrochlorothiazide (HYDRODIURIL) 25 MG tablet Take 1 tablet (25 mg total) by mouth every morning. Resume on 11/13/13. 11/06/13  Yes Lezlie Octave Black, NP  lisinopril (PRINIVIL,ZESTRIL) 5 MG tablet Take 5 mg by mouth daily.   Yes Historical Provider, MD  Menthol, Topical Analgesic, 154 MG PADS Apply 1 each topically daily as needed (for arthritis pain to right arm-shoulder).   Yes Historical Provider, MD  metoprolol tartrate (LOPRESSOR) 25 MG tablet Take 12.5 mg by mouth 2 (two) times daily.    Yes Historical Provider, MD  mirtazapine (REMERON) 15 MG tablet Take 7.5 mg by mouth at bedtime.   Yes Historical Provider, MD  nitroGLYCERIN (NITROSTAT) 0.4 MG SL tablet Place 0.4 mg under the tongue every 5 (five) minutes as needed  for chest pain.   Yes Historical Provider, MD  omeprazole (PRILOSEC) 20 MG capsule Take 20 mg by mouth daily.   Yes Historical Provider, MD  SPIRIVA HANDIHALER 18 MCG inhalation capsule USE 1 CAPSULE VIA HANDIHALER ONCE DAILY 07/20/15  Yes Historical Provider, MD  vitamin B-12 (CYANOCOBALAMIN) 250 MCG tablet Take 250 mcg by mouth daily.    Yes Historical Provider, MD  vitamin E 400 UNIT capsule Take 400 Units by mouth daily.   Yes Historical Provider, MD  levofloxacin (LEVAQUIN) 500 MG tablet Take 1 tablet (500 mg total) by mouth daily. Patient not taking: Reported on 11/01/2015 09/07/15   Caren Griffins, MD  nitrofurantoin, macrocrystal-monohydrate, (MACROBID) 100 MG capsule Take 100 mg by mouth 2 (two) times daily. 10 day course starting on 10/23/2015 10/23/15   Historical Provider, MD  predniSONE (DELTASONE) 10 MG tablet Take 3 tablets (30  mg total) by mouth daily with breakfast. 30 mg x 2 days then 20 mg x 2 days then 10 mg x 1 days Patient not taking: Reported on 11/01/2015 09/07/15   Caren Griffins, MD  traMADol (ULTRAM) 50 MG tablet Take 1 tablet (50 mg total) by mouth every 6 (six) hours as needed. Patient not taking: Reported on 11/01/2015 10/27/15   Carole Civil, MD    Family History Family History  Problem Relation Age of Onset  . Asthma Father   . Melanoma Brother     Social History Social History  Substance Use Topics  . Smoking status: Former Smoker    Types: Cigarettes  . Smokeless tobacco: Never Used  . Alcohol use No  lives at home Lives alone   Allergies   Penicillins  Review of Systems Review of Systems  Constitutional: Negative for fever.  Gastrointestinal: Positive for abdominal pain, diarrhea, nausea and vomiting (hematemesis).  All other systems reviewed and are negative.  Physical Exam Updated Vital Signs BP 129/94 (BP Location: Right Arm)   Pulse 83   Temp 98.1 F (36.7 C) (Oral)   SpO2 99%   Vital signs normal    Physical Exam    Constitutional: She is oriented to person, place, and time. She appears well-developed and well-nourished.  Non-toxic appearance. She does not appear ill. No distress.  HENT:  Head: Normocephalic and atraumatic.  Right Ear: External ear normal.  Left Ear: External ear normal.  Nose: Nose normal. No mucosal edema or rhinorrhea.  Mouth/Throat: Oropharynx is clear and moist and mucous membranes are normal. No dental abscesses or uvula swelling.  Eyes: Conjunctivae and EOM are normal. Pupils are equal, round, and reactive to light.  Neck: Normal range of motion and full passive range of motion without pain. Neck supple.  Cardiovascular: Normal rate, regular rhythm and normal heart sounds.  Exam reveals no gallop and no friction rub.   No murmur heard. Pulmonary/Chest: Effort normal and breath sounds normal. No respiratory distress. She has no wheezes. She has no rhonchi. She has no rales. She exhibits no tenderness and no crepitus.  Abdominal: Soft. Normal appearance and bowel sounds are normal. She exhibits no distension. There is tenderness. There is no rebound and no guarding.  Epigastric abdominal tenderness  Musculoskeletal: Normal range of motion. She exhibits no edema or tenderness.  Moves all extremities well.   Neurological: She is alert and oriented to person, place, and time. She has normal strength. No cranial nerve deficit.  Skin: Skin is warm, dry and intact. No rash noted. No erythema. No pallor.  Psychiatric: She has a normal mood and affect. Her speech is normal and behavior is normal. Her mood appears not anxious.  Nursing note and vitals reviewed.  ED Treatments / Results   Results for orders placed or performed during the hospital encounter of 11/01/15  Lipase, blood  Result Value Ref Range   Lipase 33 11 - 51 U/L  Comprehensive metabolic panel  Result Value Ref Range   Sodium 134 (L) 135 - 145 mmol/L   Potassium 4.1 3.5 - 5.1 mmol/L   Chloride 102 101 - 111 mmol/L    CO2 23 22 - 32 mmol/L   Glucose, Bld 196 (H) 65 - 99 mg/dL   BUN 12 6 - 20 mg/dL   Creatinine, Ser 0.88 0.44 - 1.00 mg/dL   Calcium 9.7 8.9 - 10.3 mg/dL   Total Protein 8.3 (H) 6.5 - 8.1 g/dL   Albumin 4.9  3.5 - 5.0 g/dL   AST 23 15 - 41 U/L   ALT 13 (L) 14 - 54 U/L   Alkaline Phosphatase 74 38 - 126 U/L   Total Bilirubin 0.7 0.3 - 1.2 mg/dL   GFR calc non Af Amer >60 >60 mL/min   GFR calc Af Amer >60 >60 mL/min   Anion gap 9 5 - 15  CBC  Result Value Ref Range   WBC 18.5 (H) 4.0 - 10.5 K/uL   RBC 3.58 (L) 3.87 - 5.11 MIL/uL   Hemoglobin 11.6 (L) 12.0 - 15.0 g/dL   HCT 36.0 36.0 - 46.0 %   MCV 100.6 (H) 78.0 - 100.0 fL   MCH 32.4 26.0 - 34.0 pg   MCHC 32.2 30.0 - 36.0 g/dL   RDW 15.5 11.5 - 15.5 %   Platelets 365 150 - 400 K/uL  Troponin I  Result Value Ref Range   Troponin I <0.03 <0.03 ng/mL  Occult bld gastric/duodenum (cup to lab)  Result Value Ref Range   pH, Gastric 1    Occult Blood, Gastric POSITIVE (A) NEGATIVE  Type and screen [facility]  Result Value Ref Range   ABO/RH(D) A POS    Antibody Screen NEG    Sample Expiration 11/04/2015    Laboratory interpretation all normal except leukocytosis, mild anemia at 11.6, 3 months ago her hemoglobin was 12.6 and 13.6 so there is a drop of 1-2 g tonight, hyperglycemia Nurse reports her Hemoccult was negative for blood   EKG  EKG Interpretation  Date/Time:  Tuesday November 01 2015 22:22:06 EDT Ventricular Rate:  81 PR Interval:    QRS Duration: 102 QT Interval:  417 QTC Calculation: 485 R Axis:   -25 Text Interpretation:  Sinus rhythm Borderline left axis deviation Minimal ST elevation, lateral leads No significant change since last tracing Confirmed by Christy Gentles  MD, DONALD ([id number]) on 11/01/2015 10:47:51 PM      Radiology No results found.  Procedures Procedures  DIAGNOSTIC STUDIES:  Oxygen Saturation is 99% on RA, normal by my interpretation.    COORDINATION OF CARE:  11:09 PM Discussed  treatment [name], which includes Protonix drip with pt at bedside and pt agreed to [name].   Medications Ordered in ED Medications  pantoprazole (PROTONIX) 80 mg in sodium chloride 0.9 % 250 mL (0.32 mg/mL) infusion (8 mg/hr Intravenous Transfusing/Transfer 11/02/15 0058)  pantoprazole (PROTONIX) injection 40 mg (not administered)  pantoprazole (PROTONIX) injection 40 mg (40 mg Intravenous Given 11/01/15 2232)  ondansetron (ZOFRAN) injection 4 mg (4 mg Intravenous Given 11/01/15 2321)  pantoprazole (PROTONIX) injection 40 mg (40 mg Intravenous Given 11/01/15 2325)  0.9 %  sodium chloride infusion (0 mLs Intravenous Stopped 11/02/15 0025)     Initial Impression / Assessment and [name] / ED Course  I have reviewed the triage vital signs and the nursing notes.  Pertinent labs & imaging results that were available during my care of the patient were reviewed by me and considered in my medical decision making (see chart for details).  Clinical Course   Patient was given bolus of protonix and started on protonix drip for her upper GI bleed. She was given IV Zofran for her nausea.  23:57 Nurse reports BP in the high 80's, given 1 liter bolus  23:58 PM Dr Marin Comment, will admit to ICU, asks to let GI know  BP was 77/65. Her blood pressure did respond to the 1 L bolus of normal saline.  00:16 AM Dr Oneida Alar given  update on patient.   Final Clinical Impressions(s) / ED Diagnoses   Final diagnoses:  Upper GI bleeding  Hypotension, unspecified hypotension type  Heart burn  Gastroesophageal reflux disease, esophagitis presence not specified    [name] admission  Tina Porter, MD, FACEP   CRITICAL CARE Performed by: Denisia Harpole L Young Mulvey Total critical care time: 40 minutes Critical care time was exclusive of separately billable procedures and treating other patients. Critical care was necessary to treat or prevent imminent or life-threatening deterioration. Critical care was time spent personally by me on the  following activities: development of treatment [name] with patient and/or surrogate as well as nursing, discussions with consultants, evaluation of patient's response to treatment, examination of patient, obtaining history from patient or surrogate, ordering and performing treatments and interventions, ordering and review of laboratory studies, ordering and review of radiographic studies, pulse oximetry and re-evaluation of patient's condition.  I personally performed the services described in this documentation, which was scribed in my presence. The recorded information has been reviewed and considered.  Tina Porter, MD, Barbette Or, MD 11/02/15 [phone]

## 2015-11-02 ENCOUNTER — Encounter (HOSPITAL_COMMUNITY): Payer: Self-pay | Admitting: Internal Medicine

## 2015-11-02 ENCOUNTER — Encounter (HOSPITAL_COMMUNITY)
Admission: EM | Disposition: A | Discharge status: Home Health | Payer: Self-pay | Source: Home / Self Care | Attending: Internal Medicine

## 2015-11-02 ENCOUNTER — Other Ambulatory Visit: Payer: Self-pay | Admitting: *Deleted

## 2015-11-02 DIAGNOSIS — K2211 Ulcer of esophagus with bleeding: Secondary | ICD-10-CM | POA: Diagnosis present

## 2015-11-02 DIAGNOSIS — Z825 Family history of asthma and other chronic lower respiratory diseases: Secondary | ICD-10-CM | POA: Diagnosis not present

## 2015-11-02 DIAGNOSIS — K2210 Ulcer of esophagus without bleeding: Secondary | ICD-10-CM | POA: Diagnosis not present

## 2015-11-02 DIAGNOSIS — K922 Gastrointestinal hemorrhage, unspecified: Secondary | ICD-10-CM | POA: Diagnosis not present

## 2015-11-02 DIAGNOSIS — D62 Acute posthemorrhagic anemia: Secondary | ICD-10-CM | POA: Diagnosis present

## 2015-11-02 DIAGNOSIS — D500 Iron deficiency anemia secondary to blood loss (chronic): Secondary | ICD-10-CM | POA: Diagnosis present

## 2015-11-02 DIAGNOSIS — R1314 Dysphagia, pharyngoesophageal phase: Secondary | ICD-10-CM | POA: Diagnosis not present

## 2015-11-02 DIAGNOSIS — K228 Other specified diseases of esophagus: Secondary | ICD-10-CM | POA: Diagnosis not present

## 2015-11-02 DIAGNOSIS — Z87891 Personal history of nicotine dependence: Secondary | ICD-10-CM | POA: Diagnosis not present

## 2015-11-02 DIAGNOSIS — G4730 Sleep apnea, unspecified: Secondary | ICD-10-CM | POA: Diagnosis present

## 2015-11-02 DIAGNOSIS — K580 Irritable bowel syndrome with diarrhea: Secondary | ICD-10-CM | POA: Diagnosis present

## 2015-11-02 DIAGNOSIS — Z7984 Long term (current) use of oral hypoglycemic drugs: Secondary | ICD-10-CM | POA: Diagnosis not present

## 2015-11-02 DIAGNOSIS — Z7951 Long term (current) use of inhaled steroids: Secondary | ICD-10-CM | POA: Diagnosis not present

## 2015-11-02 DIAGNOSIS — K2270 Barrett's esophagus without dysplasia: Secondary | ICD-10-CM | POA: Diagnosis not present

## 2015-11-02 DIAGNOSIS — E119 Type 2 diabetes mellitus without complications: Secondary | ICD-10-CM | POA: Diagnosis present

## 2015-11-02 DIAGNOSIS — R1319 Other dysphagia: Secondary | ICD-10-CM | POA: Diagnosis present

## 2015-11-02 DIAGNOSIS — I959 Hypotension, unspecified: Secondary | ICD-10-CM | POA: Diagnosis present

## 2015-11-02 DIAGNOSIS — Z7982 Long term (current) use of aspirin: Secondary | ICD-10-CM | POA: Diagnosis not present

## 2015-11-02 DIAGNOSIS — J449 Chronic obstructive pulmonary disease, unspecified: Secondary | ICD-10-CM | POA: Diagnosis present

## 2015-11-02 DIAGNOSIS — K920 Hematemesis: Secondary | ICD-10-CM | POA: Diagnosis not present

## 2015-11-02 DIAGNOSIS — Z66 Do not resuscitate: Secondary | ICD-10-CM | POA: Diagnosis present

## 2015-11-02 DIAGNOSIS — Z808 Family history of malignant neoplasm of other organs or systems: Secondary | ICD-10-CM | POA: Diagnosis not present

## 2015-11-02 DIAGNOSIS — I10 Essential (primary) hypertension: Secondary | ICD-10-CM | POA: Diagnosis present

## 2015-11-02 DIAGNOSIS — Z8673 Personal history of transient ischemic attack (TIA), and cerebral infarction without residual deficits: Secondary | ICD-10-CM | POA: Diagnosis not present

## 2015-11-02 DIAGNOSIS — K210 Gastro-esophageal reflux disease with esophagitis: Secondary | ICD-10-CM | POA: Diagnosis not present

## 2015-11-02 DIAGNOSIS — M109 Gout, unspecified: Secondary | ICD-10-CM | POA: Diagnosis present

## 2015-11-02 HISTORY — PX: ESOPHAGEAL DILATION: SHX303

## 2015-11-02 HISTORY — PX: ESOPHAGOGASTRODUODENOSCOPY: SHX5428

## 2015-11-02 LAB — URINALYSIS, ROUTINE W REFLEX MICROSCOPIC
Bilirubin Urine: NEGATIVE
GLUCOSE, UA: NEGATIVE mg/dL
HGB URINE DIPSTICK: NEGATIVE
Ketones, ur: NEGATIVE mg/dL
Leukocytes, UA: NEGATIVE
Nitrite: NEGATIVE
PH: 5.5 (ref 5.0–8.0)
Protein, ur: NEGATIVE mg/dL

## 2015-11-02 LAB — HEMATOCRIT
HCT: 30.6 % — ABNORMAL LOW (ref 36.0–46.0)
HCT: 31.5 % — ABNORMAL LOW (ref 36.0–46.0)
HCT: 32 % — ABNORMAL LOW (ref 36.0–46.0)

## 2015-11-02 LAB — GLUCOSE, CAPILLARY
GLUCOSE-CAPILLARY: 120 mg/dL — AB (ref 65–99)
GLUCOSE-CAPILLARY: 76 mg/dL (ref 65–99)
Glucose-Capillary: 116 mg/dL — ABNORMAL HIGH (ref 65–99)
Glucose-Capillary: 133 mg/dL — ABNORMAL HIGH (ref 65–99)
Glucose-Capillary: 150 mg/dL — ABNORMAL HIGH (ref 65–99)
Glucose-Capillary: 154 mg/dL — ABNORMAL HIGH (ref 65–99)

## 2015-11-02 LAB — PROTIME-INR
INR: 1.09
Prothrombin Time: 14.1 seconds (ref 11.4–15.2)

## 2015-11-02 LAB — MRSA PCR SCREENING: MRSA by PCR: NEGATIVE

## 2015-11-02 LAB — HEMOGLOBIN
HEMOGLOBIN: 10.2 g/dL — AB (ref 12.0–15.0)
HEMOGLOBIN: 10.5 g/dL — AB (ref 12.0–15.0)
Hemoglobin: 10.2 g/dL — ABNORMAL LOW (ref 12.0–15.0)

## 2015-11-02 SURGERY — EGD (ESOPHAGOGASTRODUODENOSCOPY)
Anesthesia: Moderate Sedation

## 2015-11-02 MED ORDER — BUTAMBEN-TETRACAINE-BENZOCAINE 2-2-14 % EX AERO
INHALATION_SPRAY | CUTANEOUS | Status: DC | PRN
  Administered 2015-11-02: 2 via TOPICAL

## 2015-11-02 MED ORDER — MIDAZOLAM HCL 5 MG/5ML IJ SOLN
INTRAMUSCULAR | Status: DC | PRN
  Administered 2015-11-02: 2 mg via INTRAVENOUS
  Administered 2015-11-02 (×2): 1 mg via INTRAVENOUS

## 2015-11-02 MED ORDER — INSULIN ASPART 100 UNIT/ML ~~LOC~~ SOLN
0.0000 [IU] | SUBCUTANEOUS | Status: DC
  Administered 2015-11-02: 2 [IU] via SUBCUTANEOUS
  Administered 2015-11-02: 3 [IU] via SUBCUTANEOUS
  Administered 2015-11-02 – 2015-11-03 (×2): 2 [IU] via SUBCUTANEOUS
  Administered 2015-11-03: 3 [IU] via SUBCUTANEOUS
  Administered 2015-11-03: 2 [IU] via SUBCUTANEOUS

## 2015-11-02 MED ORDER — CITALOPRAM HYDROBROMIDE 20 MG PO TABS
20.0000 mg | ORAL_TABLET | Freq: Every day | ORAL | Status: DC
  Administered 2015-11-02 – 2015-11-03 (×2): 20 mg via ORAL
  Filled 2015-11-02 (×2): qty 1

## 2015-11-02 MED ORDER — CETYLPYRIDINIUM CHLORIDE 0.05 % MT LIQD
7.0000 mL | Freq: Two times a day (BID) | OROMUCOSAL | Status: DC
  Administered 2015-11-02 – 2015-11-04 (×6): 7 mL via OROMUCOSAL

## 2015-11-02 MED ORDER — TRAMADOL HCL 50 MG PO TABS: 50.0000 mg | ORAL_TABLET | Freq: Four times a day (QID) | ORAL | 0 refills | Status: DC | PRN

## 2015-11-02 MED ORDER — ALPRAZOLAM 0.25 MG PO TABS
0.2500 mg | ORAL_TABLET | Freq: Three times a day (TID) | ORAL | Status: DC | PRN
  Administered 2015-11-03: 0.25 mg via ORAL
  Filled 2015-11-02: qty 1

## 2015-11-02 MED ORDER — SODIUM CHLORIDE 0.9 % IV SOLN: INTRAVENOUS | Status: DC

## 2015-11-02 MED ORDER — TIOTROPIUM BROMIDE MONOHYDRATE 18 MCG IN CAPS
18.0000 ug | ORAL_CAPSULE | Freq: Every day | RESPIRATORY_TRACT | Status: DC
  Administered 2015-11-02 – 2015-11-04 (×3): 18 ug via RESPIRATORY_TRACT
  Filled 2015-11-02: qty 5

## 2015-11-02 MED ORDER — DEXTROSE-NACL 5-0.9 % IV SOLN
INTRAVENOUS | Status: DC
  Administered 2015-11-02 – 2015-11-04 (×6): via INTRAVENOUS

## 2015-11-02 MED ORDER — SUCRALFATE 1 GM/10ML PO SUSP
1.0000 g | Freq: Three times a day (TID) | ORAL | Status: DC
  Administered 2015-11-02 – 2015-11-04 (×7): 1 g via ORAL
  Filled 2015-11-02 (×8): qty 10

## 2015-11-02 MED ORDER — MIDAZOLAM HCL 5 MG/5ML IJ SOLN
INTRAMUSCULAR | Status: AC
  Filled 2015-11-02: qty 10

## 2015-11-02 MED ORDER — MEPERIDINE HCL 50 MG/ML IJ SOLN
INTRAMUSCULAR | Status: AC
  Filled 2015-11-02: qty 1

## 2015-11-02 MED ORDER — ONDANSETRON HCL 4 MG/2ML IJ SOLN
4.0000 mg | Freq: Four times a day (QID) | INTRAMUSCULAR | Status: DC | PRN
  Administered 2015-11-02: 4 mg via INTRAVENOUS
  Filled 2015-11-02: qty 2

## 2015-11-02 MED ORDER — SODIUM CHLORIDE 0.9 % IV BOLUS (SEPSIS)
250.0000 mL | INTRAVENOUS | Status: AC
  Administered 2015-11-02: 250 mL via INTRAVENOUS

## 2015-11-02 MED ORDER — STERILE WATER FOR IRRIGATION IR SOLN
Status: DC | PRN
  Administered 2015-11-02: 16:00:00

## 2015-11-02 NOTE — Consult Note (Signed)
Reason for Consult:vomiting blood.  Referring Physician:  [name] is an 82 y.o. female.  HPI:  Admitted thru the ED yesterday. She was having chest pain. She apparently vomited blood x 2.   She tells me she had a significant amt of rectal bleeding. Her BMs have been brown. She has been taking Iron daily for IDA. She took 4 Baby ASA 81 mg at home after EMS was called. No prior hx of vomiting blood.  Daughter states patient does have dysphagia. Takes a Baby ASA 58m BID. Hx significant for systemic scleroderma and has undergone multiple EGD/ED in the past. Hx of Barrett's esophagus.  She was in hospital in June with pneumonia. She was sent home with Prednisone and Levaquin.  [facility] RTresa Endofor hematuria which was normal.  CBC    Component Value Date/Time   WBC 18.5 (H) 11/01/2015 2230   RBC 3.58 (L) 11/01/2015 2230   HGB 10.5 (L) 11/02/2015 0940   HCT 32.0 (L) 11/02/2015 0940   PLT 365 11/01/2015 2230   MCV 100.6 (H) 11/01/2015 2230   MCH 32.4 11/01/2015 2230   MCHC 32.2 11/01/2015 2230   RDW 15.5 11/01/2015 2230   LYMPHSABS 2.1 09/07/2015 0333   MONOABS 0.6 09/07/2015 0333   EOSABS 0.0 09/07/2015 0333   BASOSABS 0.0 09/07/2015 03335   Past Medical History:  Diagnosis Date  . Barrett's esophagus   . Chronic diarrhea   . COPD [place] disease[place]   . Essential hypertension, benign   . Gastritis   . Gout   . Helicobacter pylori (H. pylori)   . Irritable bowel syndrome   . Lower abdominal pain   . Raynaud's disease   . 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 Scleroderma ([place]   . Sleep apnea    Not on CPAP  . [place]roke ([place]   . [place] 2 diabetes mellitus ([place]     Past Surgical History:  Procedure Laterality Date  . BACK SURGERY    . CHOLECYSTECTOMY    . COLONOSCOPY  12/2008  . ESOPHAGOGASTRODUODENOSCOPY  06/09  . ESOPHAGOGASTRODUODENOSCOPY  02/02/05   EGD ED  . ESOPHAGOGASTRODUODENOSCOPY  03/08/00   EGD ED  .  INTRAMEDULLARY (IM) NAIL INTERTROCHANTERIC Right 11/04/2013   Procedure: INTERNAL FIXATION RIGHT HIP WITH GAMMA NAIL;  Surgeon: SCarole Civil MD;  Location: [facility];  Service: Orthopedics;  Laterality: Right;  . LUNG SURGERY  2002   Lung collapsed  . PATELLA FRACTURE SURGERY      Family History  Problem Relation Age of Onset  . Asthma Father   . Melanoma Brother     Social History:  reports that she has quit smoking. Her smoking use included Cigarettes. She has never used smokeless tobacco. She reports that she does not drink alcohol or use drugs.  Allergies:  Allergies  Allergen Reactions  . Penicillins Other (See Comments)    Unknown Has patient had a PCN reaction causing immediate rash, facial/tongue/throat swelling, SOB or lightheadedness with hypotension: Nounknown Has patient had a PCN reaction causing severe rash involving mucus membranes or skin necrosis: Nounknown Has patient had a PCN reaction that required hospitalization Nounknown Has patient had a PCN reaction occurring within the last 10 years: Nounknown If all of the above answers are "NO", then may    Medications: I have reviewed the patient's current medications.  Results for orders placed or performed during the hospital encounter of 11/01/15 (from the past 48  hour(s))  Lipase, blood     Status: None   Collection Time: 11/01/15 10:30 PM  Result Value Ref Range   Lipase 33 11 - 51 U/L  Comprehensive metabolic panel     Status: Abnormal   Collection Time: 11/01/15 10:30 PM  Result Value Ref Range   Sodium 134 (L) 135 - 145 mmol/L   Potassium 4.1 3.5 - 5.1 mmol/L   Chloride 102 101 - 111 mmol/L   CO2 23 22 - 32 mmol/L   Glucose, Bld 196 (H) 65 - 99 mg/dL   BUN 12 6 - 20 mg/dL   Creatinine, Ser 0.88 0.44 - 1.00 mg/dL   Calcium 9.7 8.9 - 10.3 mg/dL   Total Protein 8.3 (H) 6.5 - 8.1 g/dL   Albumin 4.9 3.5 - 5.0 g/dL   AST 23 15 - 41 U/L   ALT 13 (L) 14 - 54 U/L   Alkaline Phosphatase 74 38 - 126 U/L    Total Bilirubin 0.7 0.3 - 1.2 mg/dL   GFR calc non Af Amer >60 >60 mL/min   GFR calc Af Amer >60 >60 mL/min    Comment: (NOTE) The eGFR has been calculated using the CKD EPI equation. This calculation has not been validated in all clinical situations. eGFR's persistently <60 mL/min signify possible Chronic Kidney Disease.    Anion gap 9 5 - 15  CBC     Status: Abnormal   Collection Time: 11/01/15 10:30 PM  Result Value Ref Range   WBC 18.5 (H) 4.0 - 10.5 K/uL   RBC 3.58 (L) 3.87 - 5.11 MIL/uL   Hemoglobin 11.6 (L) 12.0 - 15.0 g/dL   HCT 36.0 36.0 - 46.0 %   MCV 100.6 (H) 78.0 - 100.0 fL   MCH 32.4 26.0 - 34.0 pg   MCHC 32.2 30.0 - 36.0 g/dL   RDW 15.5 11.5 - 15.5 %   Platelets 365 150 - 400 K/uL  Type and screen [facility]     Status: None   Collection Time: 11/01/15 10:30 PM  Result Value Ref Range   ABO/RH(D) A POS    Antibody Screen NEG    Sample Expiration 11/04/2015   Troponin I     Status: None   Collection Time: 11/01/15 10:30 PM  Result Value Ref Range   Troponin I <0.03 <0.03 ng/mL  Occult bld gastric/duodenum (cup to lab)     Status: Abnormal   Collection Time: 11/01/15 11:00 PM  Result Value Ref Range   pH, Gastric 1    Occult Blood, Gastric POSITIVE (A) NEGATIVE  Urinalysis, Routine w reflex microscopic     Status: Abnormal   Collection Time: 11/02/15 12:50 AM  Result Value Ref Range   Color, Urine STRAW (A) YELLOW   APPearance CLEAR CLEAR   Specific Gravity, Urine <1.005 (L) 1.005 - 1.030   pH 5.5 5.0 - 8.0   Glucose, UA NEGATIVE NEGATIVE mg/dL   Hgb urine dipstick NEGATIVE NEGATIVE   Bilirubin Urine NEGATIVE NEGATIVE   Ketones, ur NEGATIVE NEGATIVE mg/dL   Protein, ur NEGATIVE NEGATIVE mg/dL   Nitrite NEGATIVE NEGATIVE   Leukocytes, UA NEGATIVE NEGATIVE    Comment: MICROSCOPIC NOT DONE ON URINES WITH NEGATIVE PROTEIN, BLOOD, LEUKOCYTES, NITRITE, OR GLUCOSE <1000 mg/dL.  MRSA PCR Screening     Status: None   Collection Time: 11/02/15  1:35 AM   Result Value Ref Range   MRSA by PCR NEGATIVE NEGATIVE    Comment:  The GeneXpert MRSA Assay (FDA approved for NASAL specimens only), is one component of a comprehensive MRSA colonization surveillance program. It is not intended to diagnose MRSA infection nor to guide or monitor treatment for MRSA infections.   Hemoglobin     Status: Abnormal   Collection Time: 11/02/15  1:45 AM  Result Value Ref Range   Hemoglobin 10.2 (L) 12.0 - 15.0 g/dL  Hematocrit     Status: Abnormal   Collection Time: 11/02/15  1:45 AM  Result Value Ref Range   HCT 30.6 (L) 36.0 - 46.0 %  Glucose, capillary     Status: Abnormal   Collection Time: 11/02/15  4:11 AM  Result Value Ref Range   Glucose-Capillary 150 (H) 65 - 99 mg/dL  Hemoglobin     Status: Abnormal   Collection Time: 11/02/15  5:38 AM  Result Value Ref Range   Hemoglobin 10.2 (L) 12.0 - 15.0 g/dL  Hematocrit     Status: Abnormal   Collection Time: 11/02/15  5:38 AM  Result Value Ref Range   HCT 31.5 (L) 36.0 - 46.0 %  Glucose, capillary     Status: Abnormal   Collection Time: 11/02/15  7:39 AM  Result Value Ref Range   Glucose-Capillary 120 (H) 65 - 99 mg/dL   Comment 1 Notify RN   Hemoglobin     Status: Abnormal   Collection Time: 11/02/15  9:40 AM  Result Value Ref Range   Hemoglobin 10.5 (L) 12.0 - 15.0 g/dL  Hematocrit     Status: Abnormal   Collection Time: 11/02/15  9:40 AM  Result Value Ref Range   HCT 32.0 (L) 36.0 - 46.0 %  Glucose, capillary     Status: Abnormal   Collection Time: 11/02/15 11:51 AM  Result Value Ref Range   Glucose-Capillary 116 (H) 65 - 99 mg/dL   Comment 1 Notify RN     No results found.  ROS Blood pressure 98/76, pulse 88, temperature 97.6 F (36.4 C), temperature source Oral, resp. rate 18, height 5' 7" (1.702 m), weight 168 lb 10.4 oz (76.5 kg), SpO2 100 %. Physical Exam  Assessment/Plan: Hematemesis and rectal bleeding. I will discuss with Dr. Laural Golden. Patinet has been taking ASA  45m BID. I will discuss with Dr. RLaural Golden [facility]   SETZER,TERRI W 11/02/2015, 12:11 PM    GI attending note: Patient interviewed and examined. Patient well known to me from previous evaluation for reflux esophagitis in the setting of scleroderma. Last EGD with ED was in May 2013. She had erosive esophagitis and pyloric channel inflammation. Esophagus was dilated by passing [place] dilator. Patient now presents with hematemesis.  Patient's PPI was stopped 2 weeks ago and she was begun on cimetidine. She has not vomited in over 12 hours. He complains of dysphagia to solids. He denies abdominal pain. Hemoglobin has dropped from 11.6 on admission to 10.5 from earlier today. Examination pertinent for oral stenosis. Abdominal exam is within normal limits. She has extensive changes to both hands and fingers with tethered skin and contractors involving interphalangeal and distal phalangeal joints as well as muscle atrophy.  Assessment: Upper GI bleed either secondary to reflux esophagitis or peptic ulcer disease which she is at risk to develop. Patient is hemodynamically stable. Will proceed with EGD at bedside with esophageal dilation if indicated. Condition discussed with patient her daughter DArrie Aranand sister who are bedside. They're all in agreement.

## 2015-11-02 NOTE — ED Notes (Signed)
Pt placed on bedpan, pt urinated & loose stool. Linens changed & pt cleaned.

## 2015-11-02 NOTE — Care Management Important Message (Signed)
Important Message  Patient Details  Name: Tina [name] MRN: [id number] Date of Birth: 1933-05-20   Medicare Important Message Given:  Yes    Sherald Barge, RN 11/02/2015, 10:37 AM

## 2015-11-02 NOTE — Progress Notes (Signed)
PROGRESS NOTE    Tina [name]  DGL:[id number] DOB: [birth year] DOA: 11/01/2015 PCP: Purvis Kilts, MD     Brief Narrative:  82 year old woman admitted on 7/25 from home with complaints of hematemesis. She has also been having melena. Hemoglobin has been stable, GI has been consulted.   Assessment & Plan:   Active Problems:   Hypertension   Barrett's esophagus   Esophageal dysphagia   DM (diabetes mellitus) (HCC)   Upper GI bleeding   UGIB (upper gastrointestinal bleed)   Hematemesis/melena -Likely represents an upper source. -Continue IV Protonix for now. -GI consultation has been requested for potential EGD.  Type 2 diabetes -Fair control, continue current management.  History of hypertension -Patient was hypotensive on admission, improved with fluids, antihypertensive agents have been held.   DVT prophylaxis: SCDs Code Status: DO NOT RESUSCITATE Family Communication: Daughters at bedside updated on plan of care and all questions answered Disposition Plan: Transfer to floor  Consultants:   GI  Procedures:   None  Antimicrobials:   None    Subjective: Feels well, no complaints, no further hematemesis after admission  Objective: Vitals:   11/02/15 0700 11/02/15 0800 11/02/15 0900 11/02/15 1009  BP: (!) 86/69 98/71 100/72   Pulse: 95 88 91   Resp: '18 17 19   '$ Temp:      TempSrc:      SpO2: 98% 98% 99% 100%  Weight:      Height:        Intake/Output Summary (Last 24 hours) at 11/02/15 1112 Last data filed at 11/02/15 0939  Gross per 24 hour  Intake          2656.25 ml  Output             2524 ml  Net           132.25 ml   Filed Weights   11/02/15 0135 11/02/15 0500  Weight: 76.6 kg (168 lb 14 oz) 76.5 kg (168 lb 10.4 oz)    Examination:  General exam: Alert, awake, oriented x 3 Respiratory system: Clear to auscultation. Respiratory effort normal. Cardiovascular system:RRR. No murmurs, rubs, gallops. Gastrointestinal system:  Abdomen is nondistended, soft and nontender. No organomegaly or masses felt. Normal bowel sounds heard. Central nervous system: Alert and oriented. No focal neurological deficits. Extremities: No C/C/E, +pedal pulses Skin: No rashes, lesions or ulcers Psychiatry: Judgement and insight appear normal. Mood & affect appropriate.     Data Reviewed: I have personally reviewed following labs and imaging studies  CBC:  Recent Labs Lab 11/01/15 2230 11/02/15 0145 11/02/15 0538 11/02/15 0940  WBC 18.5*  --   --   --   HGB 11.6* 10.2* 10.2* 10.5*  HCT 36.0 30.6* 31.5* 32.0*  MCV 100.6*  --   --   --   PLT 365  --   --   --    Basic Metabolic Panel:  Recent Labs Lab 11/01/15 2230  NA 134*  K 4.1  CL 102  CO2 23  GLUCOSE 196*  BUN 12  CREATININE 0.88  CALCIUM 9.7   GFR: Estimated Creatinine Clearance: 54.4 mL/min (by C-G formula based on SCr of 0.88 mg/dL). Liver Function Tests:  Recent Labs Lab 11/01/15 2230  AST 23  ALT 13*  ALKPHOS 74  BILITOT 0.7  PROT 8.3*  ALBUMIN 4.9    Recent Labs Lab 11/01/15 2230  LIPASE 33   No results for input(s): AMMONIA in the last 168 hours. Coagulation Profile: No  results for input(s): INR, PROTIME in the last 168 hours. Cardiac Enzymes:  Recent Labs Lab 11/01/15 2230  TROPONINI <0.03   BNP (last 3 results) No results for input(s): PROBNP in the last 8760 hours. HbA1C: No results for input(s): HGBA1C in the last 72 hours. CBG:  Recent Labs Lab 11/02/15 0411 11/02/15 0739  GLUCAP 150* 120*   Lipid Profile: No results for input(s): CHOL, HDL, LDLCALC, TRIG, CHOLHDL, LDLDIRECT in the last 72 hours. Thyroid Function Tests: No results for input(s): TSH, T4TOTAL, FREET4, T3FREE, THYROIDAB in the last 72 hours. Anemia Panel: No results for input(s): VITAMINB12, FOLATE, FERRITIN, TIBC, IRON, RETICCTPCT in the last 72 hours. Urine analysis:    Component Value Date/Time   COLORURINE STRAW (A) 11/02/2015 0050    APPEARANCEUR CLEAR 11/02/2015 0050   LABSPEC <1.005 (L) 11/02/2015 0050   PHURINE 5.5 11/02/2015 0050   GLUCOSEU NEGATIVE 11/02/2015 0050   HGBUR NEGATIVE 11/02/2015 0050   BILIRUBINUR NEGATIVE 11/02/2015 0050   KETONESUR NEGATIVE 11/02/2015 0050   PROTEINUR NEGATIVE 11/02/2015 0050   UROBILINOGEN 0.2 04/25/2014 2025   NITRITE NEGATIVE 11/02/2015 0050   LEUKOCYTESUR NEGATIVE 11/02/2015 0050   Sepsis Labs: '@LABRCNTIP'$ (procalcitonin:4,lacticidven:4)  ) Recent Results (from the past 240 hour(s))  MRSA PCR Screening     Status: None   Collection Time: 11/02/15  1:35 AM  Result Value Ref Range Status   MRSA by PCR NEGATIVE NEGATIVE Final    Comment:        The GeneXpert MRSA Assay (FDA approved for NASAL specimens only), is one component of a comprehensive MRSA colonization surveillance program. It is not intended to diagnose MRSA infection nor to guide or monitor treatment for MRSA infections.          Radiology Studies: No results found.      Scheduled Meds: . antiseptic oral rinse  7 mL Mouth Rinse BID  . citalopram  20 mg Oral QHS  . insulin aspart  0-15 Units Subcutaneous Q4H  . [START ON 11/05/2015] pantoprazole  40 mg Intravenous Q12H  . tiotropium  18 mcg Inhalation Daily   Continuous Infusions: . dextrose 5 % and 0.9% NaCl 125 mL/hr at 11/02/15 0939  . pantoprozole (PROTONIX) infusion 8 mg/hr (11/02/15 0948)     LOS: 0 days    Time spent: 25 minutes. Greater than 50% of this time was spent in direct contact with the patient coordinating care.     Lelon Frohlich, MD [facility] Pager [phone]  If 7PM-7AM, please contact night-coverage [email or web address] Password [email or web address] 11/02/2015, 11:12 AM

## 2015-11-02 NOTE — ED Notes (Signed)
Report given to Cindy, RN.

## 2015-11-02 NOTE — Op Note (Signed)
[facility] Patient Name: Tina [name] Procedure Date: 11/02/2015 3:41 PM MRN: [id number] Date of Birth: Apr 20, 1933 Attending MD: Hildred Laser , MD CSN: [id number] Age: 82 Admit Type: Inpatient Procedure:                Upper GI endoscopy Indications:              Reflux esophagitis, Hematemesis Providers:                Hildred Laser, MD, Janeece Riggers, RN, Gwynneth Albright, RN, Randa Spike, Technician Referring MD:             Thersa Salt, MD Medicines:                Cetacaine spray, Midazolam 4 mg IV Complications:            No immediate complications. Estimated Blood Loss:     Estimated blood loss: none. Procedure:                Pre-Anesthesia Assessment:                           - Prior to the procedure, a History and Physical                            was performed, and patient medications and                            allergies were reviewed. The patient's tolerance of                            previous anesthesia was also reviewed. The risks                            and benefits of the procedure and the sedation                            options and risks were discussed with the patient.                            All questions were answered, and informed consent                            was obtained. Prior Anticoagulants: The patient                            last took aspirin 1 day prior to the procedure. ASA                            Grade Assessment: III - A patient with severe                            systemic disease. After reviewing the risks and  benefits, the patient was deemed in satisfactory                            condition to undergo the procedure.                           After obtaining informed consent, the endoscope was                            passed under direct vision. Throughout the                            procedure, the patient's blood pressure, pulse,  and                            oxygen saturations were monitored continuously. The                            EG-299OI ([id number]) scope was introduced through the                            and advanced to the. The EG-299OI ([id number]) scope                            was introduced through the and advanced to the                            second part of duodenum. The upper GI endoscopy was                            accomplished without difficulty. The patient                            tolerated the procedure well. Scope In: 3:49:23 PM Scope Out: 3:54:24 PM Total Procedure Duration: 0 hours 5 minutes 1 second  Findings:      The upper third of the esophagus and middle third of the esophagus were       normal.      Many superficial esophageal ulcers with no bleeding and stigmata of       recent bleeding were found 33 to 39 cm from the incisors. The largest       lesion was 10 mm in largest dimension.      Circumferential salmon-colored mucosa was present from 36 to 39 cm.      Four esophageal ulcers with no bleeding and no stigmata of recent       bleeding were found 38 to 39 cm from the incisors. The largest lesion       was 4 mm in largest dimension.      The Z-line was irregular and was found 39 cm from the incisors.      The entire examined stomach was normal.      The duodenal bulb and second portion of the duodenum were normal. Impression:               - Normal upper third of esophagus and middle third  of esophagus.                           - Non-bleeding esophageal ulcers at distal                            esophagus with stigmata of bleed.                           - Salmon-colored mucosa suspicious for                            short-segment Barrett's esophagus.                           - Non-bleeding esophageal ulcers at GE junction.                           - Z-line irregular, 39 cm from the incisors.                           - Normal  stomach.                           - Normal duodenal bulb and second portion of the                            duodenum.                           - No specimens collected. Moderate Sedation:      Moderate (conscious) sedation was administered by the endoscopy nurse       and supervised by the endoscopist. The following parameters were       monitored: oxygen saturation, heart rate, blood pressure, CO2       capnography and response to care. Total physician intraservice time was       10 minutes. Recommendation:           - Return patient to ICU for ongoing care.                           - Full liquid diet today.                           - Continue present medications.                           - No aspirin, ibuprofen, naproxen, or other                            non-steroidal anti-inflammatory drugs for 2 weeks.                           - Use sucralfate suspension 1 gram PO QID.                           - EGD in 8 weeks Procedure Code(s):        ---  Professional ---                           [phone], Esophagogastroduodenoscopy, flexible,                            transoral; diagnostic, including collection of                            specimen(s) by brushing or washing, when performed                            (separate procedure)                           99152, Moderate sedation services provided by the                            same physician or other qualified health care                            professional performing the diagnostic or                            therapeutic service that the sedation supports,                            requiring the presence of an independent trained                            observer to assist in the monitoring of the                            patient's level of consciousness and physiological                            status; initial 15 minutes of intraservice time,                            patient age 65 years or older Diagnosis  Code(s):        --- Professional ---                           K22.8, Other specified diseases of esophagus                           K22.10, Ulcer of esophagus without bleeding                           K21.0, Gastro-esophageal reflux disease with                            esophagitis                           K92.0, Hematemesis CPT copyright 2016 [employer]. All rights reserved.  The codes documented in this report are preliminary and upon coder review may  be revised to meet current compliance requirements. Hildred Laser, MD Hildred Laser, MD 11/02/2015 4:33:29 PM This report has been signed electronically. Number of Addenda: 0

## 2015-11-02 NOTE — Progress Notes (Signed)
Called report to Vista Deck, RN on dept 300. Verbalized understanding. Pt transferred to room 304 in safe and stable condition.

## 2015-11-02 NOTE — ED Notes (Signed)
Reatha Harps, daughter, [phone]

## 2015-11-03 LAB — CBC
HCT: 27.6 % — ABNORMAL LOW (ref 36.0–46.0)
HEMATOCRIT: 28.5 % — AB (ref 36.0–46.0)
Hemoglobin: 9.2 g/dL — ABNORMAL LOW (ref 12.0–15.0)
Hemoglobin: 9 g/dL — ABNORMAL LOW (ref 12.0–15.0)
MCH: 33 pg (ref 26.0–34.0)
MCH: 32.6 pg (ref 26.0–34.0)
MCHC: 32.3 g/dL (ref 30.0–36.0)
MCHC: 32.6 g/dL (ref 30.0–36.0)
MCV: 102.2 fL — AB (ref 78.0–100.0)
MCV: 100 fL (ref 78.0–100.0)
PLATELETS: 273 10*3/uL (ref 150–400)
PLATELETS: 270 10*3/uL (ref 150–400)
RBC: 2.79 MIL/uL — ABNORMAL LOW (ref 3.87–5.11)
RBC: 2.76 MIL/uL — AB (ref 3.87–5.11)
RDW: 15.5 % (ref 11.5–15.5)
RDW: 15.4 % (ref 11.5–15.5)
WBC: 7.7 10*3/uL (ref 4.0–10.5)
WBC: 8.6 10*3/uL (ref 4.0–10.5)

## 2015-11-03 LAB — BASIC METABOLIC PANEL
Anion gap: 5 (ref 5–15)
BUN: 8 mg/dL (ref 6–20)
CHLORIDE: 112 mmol/L — AB (ref 101–111)
CO2: 25 mmol/L (ref 22–32)
CREATININE: 0.67 mg/dL (ref 0.44–1.00)
Calcium: 8.8 mg/dL — ABNORMAL LOW (ref 8.9–10.3)
GFR calc Af Amer: >60 mL/min (ref >60)
GLUCOSE: 131 mg/dL — AB (ref 65–99)
POTASSIUM: 3.9 mmol/L (ref 3.5–5.1)
SODIUM: 142 mmol/L (ref 135–145)

## 2015-11-03 LAB — GLUCOSE, CAPILLARY
GLUCOSE-CAPILLARY: 136 mg/dL — AB (ref 65–99)
GLUCOSE-CAPILLARY: 105 mg/dL — AB (ref 65–99)
GLUCOSE-CAPILLARY: 101 mg/dL — AB (ref 65–99)
Glucose-Capillary: 102 mg/dL — ABNORMAL HIGH (ref 65–99)
Glucose-Capillary: 129 mg/dL — ABNORMAL HIGH (ref 65–99)

## 2015-11-03 MED ORDER — INSULIN ASPART 100 UNIT/ML ~~LOC~~ SOLN
0.0000 [IU] | Freq: Three times a day (TID) | SUBCUTANEOUS | Status: DC
  Administered 2015-11-04: 2 [IU] via SUBCUTANEOUS

## 2015-11-03 NOTE — Progress Notes (Signed)
  Subjective:  Patient denies hematemesis or melena. She hasn't had a bowel movement this morning. She denies chest pain heartburn or dysphagia. She states she does not have a good appetite.  Objective: Blood pressure (!) 163/60, pulse 88, temperature 98 F (36.7 C), temperature source Oral, resp. rate 18, height '5\' 7"'$  (1.702 m), weight 168 lb 10.4 oz (76.5 kg), SpO2 93 %. Patient is alert and in no acute distress. Abdomen is soft and nontender without organomegaly or masses.  Labs/studies Results:   Recent Labs  11/01/15 2230  11/02/15 0538 11/02/15 0940 11/03/15 0526  WBC 18.5*  --   --   --  7.7  HGB 11.6*  < > 10.2* 10.5* 9.2*  HCT 36.0  < > 31.5* 32.0* 28.5*  PLT 365  --   --   --  273  < > = values in this interval not displayed.  BMET   Recent Labs  11/01/15 2230 11/03/15 0526  NA 134* 142  K 4.1 3.9  CL 102 112*  CO2 23 25  GLUCOSE 196* 131*  BUN 12 8  CREATININE 0.88 0.67  CALCIUM 9.7 8.8*    LFT   Recent Labs  11/01/15 2230  PROT 8.3*  ALBUMIN 4.9  AST 23  ALT 13*  ALKPHOS 74  BILITOT 0.7    PT/INR   Recent Labs  11/02/15 1808  LABPROT 14.1  INR 1.09      Assessment:  #1. Upper GI bleed secondary to esophageal ulcers. Patient underwent EGD yesterday afternoon but did not require therapeutic intervention. Hemoglobin is from by more than 2 g. #2. Anemia secondary to upper GI bleed.   Recommendations:  Continue pantoprazole infusion for another 24 hours. Advance diet heart healthy diet. CBC in a.m.

## 2015-11-03 NOTE — Progress Notes (Signed)
PROGRESS NOTE    Tina [name]  WPY:[id number] DOB: [birth year] DOA: 11/01/2015 PCP: Purvis Kilts, MD     Brief Narrative:  82 year old woman admitted on 7/25 from home with complaints of hematemesis. She has also been having melena. Hemoglobin has been stable, GI has been consulted.   Assessment & Plan:   Active Problems:   Hypertension   Barrett's esophagus   Esophageal dysphagia   DM (diabetes mellitus) (HCC)   Upper GI bleeding   UGIB (upper gastrointestinal bleed)   Hematemesis/melena -EGD with multiple esophageal ulcers. -Continue IV Protonix for another 24 hours per GI recommendations.  Type 2 diabetes -Fair control, continue current management.  History of hypertension -Patient was hypotensive on admission, improved with fluids, antihypertensive agents have been held. -Will likely resume at DC as long as BP holds steady over next 24 hours.   DVT prophylaxis: SCDs Code Status: DO NOT RESUSCITATE Family Communication: Daughter at bedside updated on plan of care and all questions answered Disposition Plan: Transfer to floor  Consultants:   GI  Procedures:   None  Antimicrobials:   None    Subjective: Feels well, no complaints, no further hematemesis since admission; has had some dark stools overnight.  Objective: Vitals:   11/02/15 1500 11/02/15 2123 11/03/15 0535 11/03/15 0817  BP: 93/67 (!) 151/54 (!) 163/60   Pulse: 87 91 88   Resp: '19 18 18   '$ Temp:  98.4 F (36.9 C) 98 F (36.7 C)   TempSrc:  Oral Oral   SpO2: 98% 100% 100% 93%  Weight:      Height:        Intake/Output Summary (Last 24 hours) at 11/03/15 1138 Last data filed at 11/02/15 1827  Gross per 24 hour  Intake              320 ml  Output             1250 ml  Net             -930 ml   Filed Weights   11/02/15 0135 11/02/15 0500  Weight: 76.6 kg (168 lb 14 oz) 76.5 kg (168 lb 10.4 oz)    Examination:  General exam: Alert, awake, oriented x 3 Respiratory  system: Clear to auscultation. Respiratory effort normal. Cardiovascular system:RRR. No murmurs, rubs, gallops. Gastrointestinal system: Abdomen is nondistended, soft and nontender. No organomegaly or masses felt. Normal bowel sounds heard. Central nervous system: Alert and oriented. No focal neurological deficits. Extremities: No C/C/E, +pedal pulses Skin: No rashes, lesions or ulcers Psychiatry: Judgement and insight appear normal. Mood & affect appropriate.     Data Reviewed: I have personally reviewed following labs and imaging studies  CBC:  Recent Labs Lab 11/01/15 2230 11/02/15 0145 11/02/15 0538 11/02/15 0940 11/03/15 0526  WBC 18.5*  --   --   --  7.7  HGB 11.6* 10.2* 10.2* 10.5* 9.2*  HCT 36.0 30.6* 31.5* 32.0* 28.5*  MCV 100.6*  --   --   --  102.2*  PLT 365  --   --   --  053   Basic Metabolic Panel:  Recent Labs Lab 11/01/15 2230 11/03/15 0526  NA 134* 142  K 4.1 3.9  CL 102 112*  CO2 23 25  GLUCOSE 196* 131*  BUN 12 8  CREATININE 0.88 0.67  CALCIUM 9.7 8.8*   GFR: Estimated Creatinine Clearance: 59.9 mL/min (by C-G formula based on SCr of 0.8 mg/dL). Liver Function Tests:  Recent  Labs Lab 11/01/15 2230  AST 23  ALT 13*  ALKPHOS 74  BILITOT 0.7  PROT 8.3*  ALBUMIN 4.9    Recent Labs Lab 11/01/15 2230  LIPASE 33   No results for input(s): AMMONIA in the last 168 hours. Coagulation Profile:  Recent Labs Lab 11/02/15 1808  INR 1.09   Cardiac Enzymes:  Recent Labs Lab 11/01/15 2230  TROPONINI <0.03   BNP (last 3 results) No results for input(s): PROBNP in the last 8760 hours. HbA1C: No results for input(s): HGBA1C in the last 72 hours. CBG:  Recent Labs Lab 11/02/15 1636 11/02/15 1933 11/02/15 2354 11/03/15 0330 11/03/15 0732  GLUCAP 76 133* 154* 102* 129*   Lipid Profile: No results for input(s): CHOL, HDL, LDLCALC, TRIG, CHOLHDL, LDLDIRECT in the last 72 hours. Thyroid Function Tests: No results for input(s):  TSH, T4TOTAL, FREET4, T3FREE, THYROIDAB in the last 72 hours. Anemia Panel: No results for input(s): VITAMINB12, FOLATE, FERRITIN, TIBC, IRON, RETICCTPCT in the last 72 hours. Urine analysis:    Component Value Date/Time   COLORURINE STRAW (A) 11/02/2015 0050   APPEARANCEUR CLEAR 11/02/2015 0050   LABSPEC <1.005 (L) 11/02/2015 0050   PHURINE 5.5 11/02/2015 0050   GLUCOSEU NEGATIVE 11/02/2015 0050   HGBUR NEGATIVE 11/02/2015 0050   BILIRUBINUR NEGATIVE 11/02/2015 0050   KETONESUR NEGATIVE 11/02/2015 0050   PROTEINUR NEGATIVE 11/02/2015 0050   UROBILINOGEN 0.2 04/25/2014 2025   NITRITE NEGATIVE 11/02/2015 0050   LEUKOCYTESUR NEGATIVE 11/02/2015 0050   Sepsis Labs: '@LABRCNTIP'$ (procalcitonin:4,lacticidven:4)  ) Recent Results (from the past 240 hour(s))  MRSA PCR Screening     Status: None   Collection Time: 11/02/15  1:35 AM  Result Value Ref Range Status   MRSA by PCR NEGATIVE NEGATIVE Final    Comment:        The GeneXpert MRSA Assay (FDA approved for NASAL specimens only), is one component of a comprehensive MRSA colonization surveillance program. It is not intended to diagnose MRSA infection nor to guide or monitor treatment for MRSA infections.          Radiology Studies: No results found.      Scheduled Meds: . antiseptic oral rinse  7 mL Mouth Rinse BID  . citalopram  20 mg Oral QHS  . insulin aspart  0-15 Units Subcutaneous Q4H  . [START ON 11/05/2015] pantoprazole  40 mg Intravenous Q12H  . sucralfate  1 g Oral TID WC & HS  . tiotropium  18 mcg Inhalation Daily   Continuous Infusions: . dextrose 5 % and 0.9% NaCl 125 mL/hr at 11/02/15 1920  . pantoprozole (PROTONIX) infusion 8 mg/hr (11/03/15 0539)     LOS: 1 day    Time spent: 25 minutes. Greater than 50% of this time was spent in direct contact with the patient coordinating care.     Lelon Frohlich, MD [facility] Pager [phone]  If 7PM-7AM, please contact  night-coverage [email or web address] Password [facility] 11/03/2015, 11:38 AM

## 2015-11-03 NOTE — Consult Note (Signed)
   [facility] CM Inpatient Consult   11/03/2015  Tina [name] 05/09/1933 [id number]  Met with patient and daughter at bedside. Reviewed [facility] program services again with patient and daughter. Patient continues to state she does not have any needs, she is still getting Home Care and her family is very supportive. Patient daughter took a brochure for future reference. Of note, [facility] program services would not replace or interfere with any services arranged by inpatient case management or social work. For questions contact: Royetta Crochet. [name], RN, BSN, [place] [phone]

## 2015-11-03 NOTE — Care Management Note (Signed)
Case Management Note  Patient Details  Name: Tina [name] MRN: [id number] Date of Birth: April 23, 1933  Subjective/Objective:                  Pt admitted with GIB. She is from home, lives alone and has family who stay with her at night. Pt is ind with ADL's. She has a cane, walker and BSC if she needs them. She is active with [facility] for nursing and PT services. She plans to return home with resumption of those services. Her daughter is at the bedside. Romualdo Bolk, of [facility], is aware of pt admission and will obtain pt info from chart.  Action/Plan: Will cont to follow.   Expected Discharge Date:  11/05/15               Expected Discharge Plan:  [place]  In-House Referral:  NA  Discharge planning Services  CM Consult  Post Acute Care Choice:  Home Health, Resumption of Svcs/PTA Provider Choice offered to:  Patient  DME Arranged:    DME Agency:     HH Arranged:  RN, PT [place] Agency:  [place]  Status of Service:  In process, will continue to follow  If discussed at Long Length of Stay Meetings, dates discussed:    Additional Comments:  Sherald Barge, RN 11/03/2015, 2:07 PM

## 2015-11-04 ENCOUNTER — Encounter (HOSPITAL_COMMUNITY): Payer: Self-pay | Admitting: Internal Medicine

## 2015-11-04 LAB — CBC
HEMATOCRIT: 28.9 % — AB (ref 36.0–46.0)
Hemoglobin: 9.4 g/dL — ABNORMAL LOW (ref 12.0–15.0)
MCH: 32.6 pg (ref 26.0–34.0)
MCHC: 32.5 g/dL (ref 30.0–36.0)
MCV: 100.3 fL — ABNORMAL HIGH (ref 78.0–100.0)
Platelets: 287 10*3/uL (ref 150–400)
RBC: 2.88 MIL/uL — ABNORMAL LOW (ref 3.87–5.11)
RDW: 15.4 % (ref 11.5–15.5)
WBC: 7.3 10*3/uL (ref 4.0–10.5)

## 2015-11-04 LAB — GLUCOSE, CAPILLARY
Glucose-Capillary: 132 mg/dL — ABNORMAL HIGH (ref 65–99)
Glucose-Capillary: 107 mg/dL — ABNORMAL HIGH (ref 65–99)

## 2015-11-04 MED ORDER — PANTOPRAZOLE SODIUM 40 MG PO TBEC: 40.0000 mg | DELAYED_RELEASE_TABLET | Freq: Every day | ORAL | 2 refills | Status: DC

## 2015-11-04 MED ORDER — PANTOPRAZOLE SODIUM 40 MG PO TBEC: 40.0000 mg | DELAYED_RELEASE_TABLET | Freq: Two times a day (BID) | ORAL | 2 refills | Status: AC

## 2015-11-04 NOTE — Care Management Note (Signed)
Case Management Note  Patient Details  Name: NATALLIE RAVENSCROFT MRN: [id number] Date of Birth: 1934/01/26  Expected Discharge Date:  11/05/15               Expected Discharge Plan:  [place]  In-House Referral:  NA  Discharge planning Services  CM Consult  Post Acute Care Choice:  Home Health, Resumption of Svcs/PTA Provider Choice offered to:  Patient  DME Arranged:    DME Agency:     HH Arranged:  RN, PT [place] Agency:  [place]  Status of Service:  Completed, signed off  If discussed at [place] of Stay Meetings, dates discussed:    Additional Comments: Pt discharging home today with resumption of HH services through [facility]. Pt/daughter aware that [place] ha s48 hours to resume services. Blake Divine, of [facility], made aware of DC today and will obtain orders from chart. No further needs at this time.   Sherald Barge, RN 11/04/2015, 11:23 AM

## 2015-11-04 NOTE — Care Management Important Message (Signed)
Important Message  Patient Details  Name: Tina [name] MRN: [id number] Date of Birth: [birth year]   Medicare Important Message Given:  Yes    Sherald Barge, RN 11/04/2015, 9:16 AM

## 2015-11-04 NOTE — Discharge Summary (Addendum)
Physician Discharge Summary  Tina [name] MBE:[id number] DOB: 1933-05-23 DOA: 11/01/2015  PCP: Purvis Kilts, MD  Admit date: 11/01/2015 Discharge date: 11/04/2015  Time spent: 45 minutes  Recommendations for Outpatient Follow-up:  Will be discharged home today Advised to follow-up with primary care provider in 2 weeks   Discharge Diagnoses:  Active Problems:   Hypertension   Barrett's esophagus   Esophageal dysphagia   DM (diabetes mellitus) (HCC)   Upper GI bleeding   UGIB (upper gastrointestinal bleed)   Discharge Condition: Stable and improved  Filed Weights   11/02/15 0135 11/02/15 0500  Weight: 76.6 kg (168 lb 14 oz) 76.5 kg (168 lb 10.4 oz)    History of present illness:  As per Dr. Marin Comment on 7/25: Tina [name] is a 82 y.o. female with medical history significant of Barrnett's esophagus, COPD, essential HTN presented with complaints of hematemesis x2 that onset this evening. She also reports associated epigastric pain described as aching and gradually worsening with indigestion, nausea and diarrhea. Per notes patient was recently on prednisone and ASA. She denies additional NSAID use. Daughter bedside reports that blood was found in her urine one week ago and she was seen by urology, who suspected early onset UTI. She was given antibiotic to take should she has symptoms, but she didn't. No black stool, no bloody stool.    ED Course: CMP unremarkable. WBC 18.5, gastric occult blood positive. She was also noted to have SBP of 77. Responded to IVF to 104.  Her BUN was normal.  Due to concerns for UGIB and hypotension she has been referred for admission.      Hospital Course:   Hematemesis/melena -EGD with multiple esophageal ulcers. -Per GI recommendations will send home on Protonix 40 mg twice daily.  Acute Blood Loss Anemia -Due to above. -No indication for acute transfusion.  Type 2 diabetes -Fair control, continue current management.  History of  hypertension -Patient was hypotensive on admission, improved with fluids, antihypertensive agents had been held. -We'll resume BP meds on discharge given her blood pressure has now risen.  Procedures:  EGD as above   Consultations:  GI  Discharge Instructions  Discharge Instructions    Diet - low sodium heart healthy    Complete by:  As directed   Increase activity slowly    Complete by:  As directed       Medication List    STOP taking these medications   aspirin EC 81 MG tablet   levofloxacin 500 MG tablet Commonly known as:  LEVAQUIN   nitrofurantoin (macrocrystal-monohydrate) 100 MG capsule Commonly known as:  MACROBID   omeprazole 20 MG capsule Commonly known as:  PRILOSEC   predniSONE 10 MG tablet Commonly known as:  DELTASONE     TAKE these medications   acetaminophen 500 MG tablet Commonly known as:  TYLENOL Take 500 mg by mouth 2 (two) times daily.   allopurinol 100 MG tablet Commonly known as:  ZYLOPRIM Take 100 mg by mouth 2 (two) times daily.   ALPRAZolam 0.25 MG tablet Commonly known as:  XANAX Take 1 tablet (0.25 mg total) by mouth 3 (three) times daily as needed for anxiety. What changed:  reasons to take this   BEVESPI AEROSPHERE 9-4.8 MCG/ACT Aero Generic drug:  Glycopyrrolate-Formoterol Inhale 2 puffs into the lungs 2 (two) times daily.   CARTIA XT 180 MG 24 hr capsule Generic drug:  diltiazem Take 180 mg by mouth 2 (two) times daily.  citalopram 20 MG tablet Commonly known as:  CELEXA Take 20 mg by mouth at bedtime.   donepezil 10 MG tablet Commonly known as:  ARICEPT Take 10 mg by mouth at bedtime.   ferrous sulfate 325 (65 FE) MG tablet Take 325 mg by mouth every morning.   hydrochlorothiazide 25 MG tablet Commonly known as:  HYDRODIURIL Take 1 tablet (25 mg total) by mouth every morning. Resume on 11/13/13.   lisinopril 5 MG tablet Commonly known as:  PRINIVIL,ZESTRIL Take 5 mg by mouth daily.   Menthol (Topical  Analgesic) 154 MG Pads Apply 1 each topically daily as needed (for arthritis pain to right arm-shoulder).   metoprolol tartrate 25 MG tablet Commonly known as:  LOPRESSOR Take 12.5 mg by mouth 2 (two) times daily.   mirtazapine 15 MG tablet Commonly known as:  REMERON Take 7.5 mg by mouth at bedtime.   nitroGLYCERIN 0.4 MG SL tablet Commonly known as:  NITROSTAT Place 0.4 mg under the tongue every 5 (five) minutes as needed for chest pain.   pantoprazole 40 MG tablet Commonly known as:  PROTONIX Take 1 tablet (40 mg total) by mouth 2 (two) times daily.   SPIRIVA HANDIHALER 18 MCG inhalation capsule Generic drug:  tiotropium USE 1 CAPSULE VIA HANDIHALER ONCE DAILY   traMADol 50 MG tablet Commonly known as:  ULTRAM Take 1 tablet (50 mg total) by mouth every 6 (six) hours as needed.   vitamin B-12 250 MCG tablet Commonly known as:  CYANOCOBALAMIN Take 250 mcg by mouth daily.   VITAMIN D PO Take 1 tablet by mouth every morning.   vitamin E 400 UNIT capsule Take 400 Units by mouth daily.      Allergies  Allergen Reactions  . Penicillins Other (See Comments)       Follow-up Information    Purvis Kilts, MD. Schedule an appointment as soon as possible for a visit in 2 week(s).   Specialty:  Family Medicine Contact information: [address] [place] [address] [phone]            The results of significant diagnostics from this hospitalization (including imaging, microbiology, ancillary and laboratory) are listed below for reference.    Significant Diagnostic Studies: No results found.  Microbiology: Recent Results (from the past 240 hour(s))  MRSA PCR Screening     Status: None   Collection Time: 11/02/15  1:35 AM  Result Value Ref Range Status   MRSA by PCR NEGATIVE NEGATIVE Final    Comment:        The GeneXpert MRSA Assay (FDA approved for NASAL specimens only), is one component of a comprehensive MRSA colonization surveillance  program. It is not intended to diagnose MRSA infection nor to guide or monitor treatment for MRSA infections.      Labs: Basic Metabolic Panel:  Recent Labs Lab 11/01/15 2230 11/03/15 0526  NA 134* 142  K 4.1 3.9  CL 102 112*  CO2 23 25  GLUCOSE 196* 131*  BUN 12 8  CREATININE 0.88 0.67  CALCIUM 9.7 8.8*   Liver Function Tests:  Recent Labs Lab 11/01/15 2230  AST 23  ALT 13*  ALKPHOS 74  BILITOT 0.7  PROT 8.3*  ALBUMIN 4.9    Recent Labs Lab 11/01/15 2230  LIPASE 33   No results for input(s): AMMONIA in the last 168 hours. CBC:  Recent Labs Lab 11/01/15 2230  11/02/15 0538 11/02/15 0940 11/03/15 0526 11/03/15 2302 11/04/15 0452  WBC 18.5*  --   --   --  7.7 8.6 7.3  HGB 11.6*  < > 10.2* 10.5* 9.2* 9.0* 9.4*  HCT 36.0  < > 31.5* 32.0* 28.5* 27.6* 28.9*  MCV 100.6*  --   --   --  102.2* 100.0 100.3*  PLT 365  --   --   --  273 270 287  < > = values in this interval not displayed. Cardiac Enzymes:  Recent Labs Lab 11/01/15 2230  TROPONINI <0.03   BNP: BNP (last 3 results)  Recent Labs  09/05/15 1745  BNP 208.0*    ProBNP (last 3 results) No results for input(s): PROBNP in the last 8760 hours.  CBG:  Recent Labs Lab 11/03/15 1151 11/03/15 1627 11/03/15 2038 11/04/15 0804 11/04/15 1123  GLUCAP 136* 105* 101* 132* 107*       Signed:  HERNANDEZ ACOSTA,Ermin Parisien  [facility] Pager: [phone] 11/04/2015, 12:51 PM

## 2015-11-04 NOTE — Progress Notes (Signed)
Discharged PT per MD order and protocol. Discharge handouts reviewed/explained. Education completed. Prescriptions given and explained.   Pt verbalized understanding and left with all belongings. VSS. IV catheter D/C.  Patient wheeled down by staff member.

## 2015-11-07 DIAGNOSIS — D509 Iron deficiency anemia, unspecified: Secondary | ICD-10-CM | POA: Diagnosis not present

## 2015-11-07 DIAGNOSIS — J440 Chronic obstructive pulmonary disease with acute lower respiratory infection: Secondary | ICD-10-CM | POA: Diagnosis not present

## 2015-11-07 DIAGNOSIS — J441 Chronic obstructive pulmonary disease with (acute) exacerbation: Secondary | ICD-10-CM | POA: Diagnosis not present

## 2015-11-07 DIAGNOSIS — J189 Pneumonia, unspecified organism: Secondary | ICD-10-CM | POA: Diagnosis not present

## 2015-11-07 DIAGNOSIS — I10 Essential (primary) hypertension: Secondary | ICD-10-CM | POA: Diagnosis not present

## 2015-11-07 DIAGNOSIS — E119 Type 2 diabetes mellitus without complications: Secondary | ICD-10-CM | POA: Diagnosis not present

## 2015-11-10 DIAGNOSIS — E119 Type 2 diabetes mellitus without complications: Secondary | ICD-10-CM | POA: Diagnosis not present

## 2015-11-10 DIAGNOSIS — J440 Chronic obstructive pulmonary disease with acute lower respiratory infection: Secondary | ICD-10-CM | POA: Diagnosis not present

## 2015-11-10 DIAGNOSIS — J189 Pneumonia, unspecified organism: Secondary | ICD-10-CM | POA: Diagnosis not present

## 2015-11-10 DIAGNOSIS — I10 Essential (primary) hypertension: Secondary | ICD-10-CM | POA: Diagnosis not present

## 2015-11-10 DIAGNOSIS — J441 Chronic obstructive pulmonary disease with (acute) exacerbation: Secondary | ICD-10-CM | POA: Diagnosis not present

## 2015-11-10 DIAGNOSIS — D509 Iron deficiency anemia, unspecified: Secondary | ICD-10-CM | POA: Diagnosis not present

## 2015-11-12 DIAGNOSIS — D509 Iron deficiency anemia, unspecified: Secondary | ICD-10-CM | POA: Diagnosis not present

## 2015-11-12 DIAGNOSIS — E119 Type 2 diabetes mellitus without complications: Secondary | ICD-10-CM | POA: Diagnosis not present

## 2015-11-12 DIAGNOSIS — M109 Gout, unspecified: Secondary | ICD-10-CM | POA: Diagnosis not present

## 2015-11-12 DIAGNOSIS — I10 Essential (primary) hypertension: Secondary | ICD-10-CM | POA: Diagnosis not present

## 2015-11-12 DIAGNOSIS — I7300 Raynaud's syndrome without gangrene: Secondary | ICD-10-CM | POA: Diagnosis not present

## 2015-11-12 DIAGNOSIS — J449 Chronic obstructive pulmonary disease, unspecified: Secondary | ICD-10-CM | POA: Diagnosis not present

## 2015-11-12 DIAGNOSIS — K2211 Ulcer of esophagus with bleeding: Secondary | ICD-10-CM | POA: Diagnosis not present

## 2015-11-14 DIAGNOSIS — K922 Gastrointestinal hemorrhage, unspecified: Secondary | ICD-10-CM | POA: Diagnosis not present

## 2015-11-14 DIAGNOSIS — J449 Chronic obstructive pulmonary disease, unspecified: Secondary | ICD-10-CM | POA: Diagnosis not present

## 2015-11-14 DIAGNOSIS — E119 Type 2 diabetes mellitus without complications: Secondary | ICD-10-CM | POA: Diagnosis not present

## 2015-11-14 DIAGNOSIS — Z1389 Encounter for screening for other disorder: Secondary | ICD-10-CM | POA: Diagnosis not present

## 2015-11-14 DIAGNOSIS — K22719 Barrett's esophagus with dysplasia, unspecified: Secondary | ICD-10-CM | POA: Diagnosis not present

## 2015-11-14 DIAGNOSIS — Z6825 Body mass index (BMI) 25.0-25.9, adult: Secondary | ICD-10-CM | POA: Diagnosis not present

## 2015-11-14 DIAGNOSIS — M810 Age-related osteoporosis without current pathological fracture: Secondary | ICD-10-CM | POA: Diagnosis not present

## 2015-11-14 DIAGNOSIS — D649 Anemia, unspecified: Secondary | ICD-10-CM | POA: Diagnosis not present

## 2015-11-14 DIAGNOSIS — M1990 Unspecified osteoarthritis, unspecified site: Secondary | ICD-10-CM | POA: Diagnosis not present

## 2015-11-14 DIAGNOSIS — E663 Overweight: Secondary | ICD-10-CM | POA: Diagnosis not present

## 2015-11-14 DIAGNOSIS — K219 Gastro-esophageal reflux disease without esophagitis: Secondary | ICD-10-CM | POA: Diagnosis not present

## 2015-11-14 DIAGNOSIS — M838 Other adult osteomalacia: Secondary | ICD-10-CM | POA: Diagnosis not present

## 2015-11-14 DIAGNOSIS — I10 Essential (primary) hypertension: Secondary | ICD-10-CM | POA: Diagnosis not present

## 2015-11-14 DIAGNOSIS — E748 Other specified disorders of carbohydrate metabolism: Secondary | ICD-10-CM | POA: Diagnosis not present

## 2015-11-15 DIAGNOSIS — J449 Chronic obstructive pulmonary disease, unspecified: Secondary | ICD-10-CM | POA: Diagnosis not present

## 2015-11-15 DIAGNOSIS — E119 Type 2 diabetes mellitus without complications: Secondary | ICD-10-CM | POA: Diagnosis not present

## 2015-11-15 DIAGNOSIS — D509 Iron deficiency anemia, unspecified: Secondary | ICD-10-CM | POA: Diagnosis not present

## 2015-11-15 DIAGNOSIS — K2211 Ulcer of esophagus with bleeding: Secondary | ICD-10-CM | POA: Diagnosis not present

## 2015-11-15 DIAGNOSIS — M109 Gout, unspecified: Secondary | ICD-10-CM | POA: Diagnosis not present

## 2015-11-15 DIAGNOSIS — I10 Essential (primary) hypertension: Secondary | ICD-10-CM | POA: Diagnosis not present

## 2015-11-22 DIAGNOSIS — I10 Essential (primary) hypertension: Secondary | ICD-10-CM | POA: Diagnosis not present

## 2015-11-22 DIAGNOSIS — M109 Gout, unspecified: Secondary | ICD-10-CM | POA: Diagnosis not present

## 2015-11-22 DIAGNOSIS — J449 Chronic obstructive pulmonary disease, unspecified: Secondary | ICD-10-CM | POA: Diagnosis not present

## 2015-11-22 DIAGNOSIS — D509 Iron deficiency anemia, unspecified: Secondary | ICD-10-CM | POA: Diagnosis not present

## 2015-11-22 DIAGNOSIS — E119 Type 2 diabetes mellitus without complications: Secondary | ICD-10-CM | POA: Diagnosis not present

## 2015-11-22 DIAGNOSIS — K2211 Ulcer of esophagus with bleeding: Secondary | ICD-10-CM | POA: Diagnosis not present

## 2015-11-23 DIAGNOSIS — E119 Type 2 diabetes mellitus without complications: Secondary | ICD-10-CM | POA: Diagnosis not present

## 2015-11-23 DIAGNOSIS — D509 Iron deficiency anemia, unspecified: Secondary | ICD-10-CM | POA: Diagnosis not present

## 2015-11-23 DIAGNOSIS — I10 Essential (primary) hypertension: Secondary | ICD-10-CM | POA: Diagnosis not present

## 2015-11-23 DIAGNOSIS — M109 Gout, unspecified: Secondary | ICD-10-CM | POA: Diagnosis not present

## 2015-11-23 DIAGNOSIS — K2211 Ulcer of esophagus with bleeding: Secondary | ICD-10-CM | POA: Diagnosis not present

## 2015-11-23 DIAGNOSIS — J449 Chronic obstructive pulmonary disease, unspecified: Secondary | ICD-10-CM | POA: Diagnosis not present

## 2015-11-25 DIAGNOSIS — K2211 Ulcer of esophagus with bleeding: Secondary | ICD-10-CM | POA: Diagnosis not present

## 2015-11-25 DIAGNOSIS — M109 Gout, unspecified: Secondary | ICD-10-CM | POA: Diagnosis not present

## 2015-11-25 DIAGNOSIS — J449 Chronic obstructive pulmonary disease, unspecified: Secondary | ICD-10-CM | POA: Diagnosis not present

## 2015-11-25 DIAGNOSIS — E119 Type 2 diabetes mellitus without complications: Secondary | ICD-10-CM | POA: Diagnosis not present

## 2015-11-25 DIAGNOSIS — D509 Iron deficiency anemia, unspecified: Secondary | ICD-10-CM | POA: Diagnosis not present

## 2015-11-25 DIAGNOSIS — I10 Essential (primary) hypertension: Secondary | ICD-10-CM | POA: Diagnosis not present

## 2015-11-28 ENCOUNTER — Telehealth (INDEPENDENT_AMBULATORY_CARE_PROVIDER_SITE_OTHER): Payer: Self-pay | Admitting: Internal Medicine

## 2015-11-28 DIAGNOSIS — K2211 Ulcer of esophagus with bleeding: Secondary | ICD-10-CM | POA: Diagnosis not present

## 2015-11-28 DIAGNOSIS — J449 Chronic obstructive pulmonary disease, unspecified: Secondary | ICD-10-CM | POA: Diagnosis not present

## 2015-11-28 DIAGNOSIS — E119 Type 2 diabetes mellitus without complications: Secondary | ICD-10-CM | POA: Diagnosis not present

## 2015-11-28 DIAGNOSIS — M109 Gout, unspecified: Secondary | ICD-10-CM | POA: Diagnosis not present

## 2015-11-28 DIAGNOSIS — D509 Iron deficiency anemia, unspecified: Secondary | ICD-10-CM | POA: Diagnosis not present

## 2015-11-28 DIAGNOSIS — I10 Essential (primary) hypertension: Secondary | ICD-10-CM | POA: Diagnosis not present

## 2015-11-28 NOTE — Telephone Encounter (Signed)
Patient's daughter, Reatha Harps called and wanted to know if the patient could start her baby aspirin and prilosec again.  [phone]

## 2015-11-28 NOTE — Telephone Encounter (Signed)
To be addressed with Dr.Rehman. 

## 2015-11-29 NOTE — Telephone Encounter (Signed)
Per Dr.Rehman the patient may start the aspirin.

## 2015-11-30 DIAGNOSIS — M109 Gout, unspecified: Secondary | ICD-10-CM | POA: Diagnosis not present

## 2015-11-30 DIAGNOSIS — J449 Chronic obstructive pulmonary disease, unspecified: Secondary | ICD-10-CM | POA: Diagnosis not present

## 2015-11-30 DIAGNOSIS — E119 Type 2 diabetes mellitus without complications: Secondary | ICD-10-CM | POA: Diagnosis not present

## 2015-11-30 DIAGNOSIS — D509 Iron deficiency anemia, unspecified: Secondary | ICD-10-CM | POA: Diagnosis not present

## 2015-11-30 DIAGNOSIS — I10 Essential (primary) hypertension: Secondary | ICD-10-CM | POA: Diagnosis not present

## 2015-11-30 DIAGNOSIS — K2211 Ulcer of esophagus with bleeding: Secondary | ICD-10-CM | POA: Diagnosis not present

## 2015-11-30 NOTE — Telephone Encounter (Signed)
A message was left on the patient's daughter voicemail with Dr.Rehman's recommendation.

## 2015-12-07 DIAGNOSIS — K2211 Ulcer of esophagus with bleeding: Secondary | ICD-10-CM | POA: Diagnosis not present

## 2015-12-07 DIAGNOSIS — I10 Essential (primary) hypertension: Secondary | ICD-10-CM | POA: Diagnosis not present

## 2015-12-07 DIAGNOSIS — M109 Gout, unspecified: Secondary | ICD-10-CM | POA: Diagnosis not present

## 2015-12-07 DIAGNOSIS — J449 Chronic obstructive pulmonary disease, unspecified: Secondary | ICD-10-CM | POA: Diagnosis not present

## 2015-12-07 DIAGNOSIS — E119 Type 2 diabetes mellitus without complications: Secondary | ICD-10-CM | POA: Diagnosis not present

## 2015-12-07 DIAGNOSIS — D509 Iron deficiency anemia, unspecified: Secondary | ICD-10-CM | POA: Diagnosis not present

## 2015-12-08 DIAGNOSIS — K2211 Ulcer of esophagus with bleeding: Secondary | ICD-10-CM | POA: Diagnosis not present

## 2015-12-08 DIAGNOSIS — J449 Chronic obstructive pulmonary disease, unspecified: Secondary | ICD-10-CM | POA: Diagnosis not present

## 2015-12-08 DIAGNOSIS — I10 Essential (primary) hypertension: Secondary | ICD-10-CM | POA: Diagnosis not present

## 2015-12-08 DIAGNOSIS — M109 Gout, unspecified: Secondary | ICD-10-CM | POA: Diagnosis not present

## 2015-12-08 DIAGNOSIS — D509 Iron deficiency anemia, unspecified: Secondary | ICD-10-CM | POA: Diagnosis not present

## 2015-12-08 DIAGNOSIS — E119 Type 2 diabetes mellitus without complications: Secondary | ICD-10-CM | POA: Diagnosis not present

## 2015-12-09 DIAGNOSIS — I10 Essential (primary) hypertension: Secondary | ICD-10-CM | POA: Diagnosis not present

## 2015-12-09 DIAGNOSIS — K2211 Ulcer of esophagus with bleeding: Secondary | ICD-10-CM | POA: Diagnosis not present

## 2015-12-09 DIAGNOSIS — D509 Iron deficiency anemia, unspecified: Secondary | ICD-10-CM | POA: Diagnosis not present

## 2015-12-09 DIAGNOSIS — J449 Chronic obstructive pulmonary disease, unspecified: Secondary | ICD-10-CM | POA: Diagnosis not present

## 2015-12-09 DIAGNOSIS — E119 Type 2 diabetes mellitus without complications: Secondary | ICD-10-CM | POA: Diagnosis not present

## 2015-12-09 DIAGNOSIS — M109 Gout, unspecified: Secondary | ICD-10-CM | POA: Diagnosis not present

## 2015-12-14 DIAGNOSIS — D509 Iron deficiency anemia, unspecified: Secondary | ICD-10-CM | POA: Diagnosis not present

## 2015-12-14 DIAGNOSIS — K2211 Ulcer of esophagus with bleeding: Secondary | ICD-10-CM | POA: Diagnosis not present

## 2015-12-14 DIAGNOSIS — E119 Type 2 diabetes mellitus without complications: Secondary | ICD-10-CM | POA: Diagnosis not present

## 2015-12-14 DIAGNOSIS — M109 Gout, unspecified: Secondary | ICD-10-CM | POA: Diagnosis not present

## 2015-12-14 DIAGNOSIS — J449 Chronic obstructive pulmonary disease, unspecified: Secondary | ICD-10-CM | POA: Diagnosis not present

## 2015-12-14 DIAGNOSIS — I10 Essential (primary) hypertension: Secondary | ICD-10-CM | POA: Diagnosis not present

## 2015-12-15 DIAGNOSIS — I10 Essential (primary) hypertension: Secondary | ICD-10-CM | POA: Diagnosis not present

## 2015-12-15 DIAGNOSIS — M109 Gout, unspecified: Secondary | ICD-10-CM | POA: Diagnosis not present

## 2015-12-15 DIAGNOSIS — D509 Iron deficiency anemia, unspecified: Secondary | ICD-10-CM | POA: Diagnosis not present

## 2015-12-15 DIAGNOSIS — K2211 Ulcer of esophagus with bleeding: Secondary | ICD-10-CM | POA: Diagnosis not present

## 2015-12-15 DIAGNOSIS — E119 Type 2 diabetes mellitus without complications: Secondary | ICD-10-CM | POA: Diagnosis not present

## 2015-12-15 DIAGNOSIS — J449 Chronic obstructive pulmonary disease, unspecified: Secondary | ICD-10-CM | POA: Diagnosis not present

## 2015-12-19 DIAGNOSIS — D509 Iron deficiency anemia, unspecified: Secondary | ICD-10-CM | POA: Diagnosis not present

## 2015-12-19 DIAGNOSIS — M109 Gout, unspecified: Secondary | ICD-10-CM | POA: Diagnosis not present

## 2015-12-19 DIAGNOSIS — K2211 Ulcer of esophagus with bleeding: Secondary | ICD-10-CM | POA: Diagnosis not present

## 2015-12-19 DIAGNOSIS — I10 Essential (primary) hypertension: Secondary | ICD-10-CM | POA: Diagnosis not present

## 2015-12-19 DIAGNOSIS — E119 Type 2 diabetes mellitus without complications: Secondary | ICD-10-CM | POA: Diagnosis not present

## 2015-12-19 DIAGNOSIS — J449 Chronic obstructive pulmonary disease, unspecified: Secondary | ICD-10-CM | POA: Diagnosis not present

## 2015-12-21 DIAGNOSIS — I10 Essential (primary) hypertension: Secondary | ICD-10-CM | POA: Diagnosis not present

## 2015-12-21 DIAGNOSIS — J449 Chronic obstructive pulmonary disease, unspecified: Secondary | ICD-10-CM | POA: Diagnosis not present

## 2015-12-21 DIAGNOSIS — M109 Gout, unspecified: Secondary | ICD-10-CM | POA: Diagnosis not present

## 2015-12-21 DIAGNOSIS — D509 Iron deficiency anemia, unspecified: Secondary | ICD-10-CM | POA: Diagnosis not present

## 2015-12-21 DIAGNOSIS — K2211 Ulcer of esophagus with bleeding: Secondary | ICD-10-CM | POA: Diagnosis not present

## 2015-12-21 DIAGNOSIS — E119 Type 2 diabetes mellitus without complications: Secondary | ICD-10-CM | POA: Diagnosis not present

## 2015-12-26 DIAGNOSIS — E119 Type 2 diabetes mellitus without complications: Secondary | ICD-10-CM | POA: Diagnosis not present

## 2015-12-26 DIAGNOSIS — I10 Essential (primary) hypertension: Secondary | ICD-10-CM | POA: Diagnosis not present

## 2015-12-26 DIAGNOSIS — M109 Gout, unspecified: Secondary | ICD-10-CM | POA: Diagnosis not present

## 2015-12-26 DIAGNOSIS — K2211 Ulcer of esophagus with bleeding: Secondary | ICD-10-CM | POA: Diagnosis not present

## 2015-12-26 DIAGNOSIS — D509 Iron deficiency anemia, unspecified: Secondary | ICD-10-CM | POA: Diagnosis not present

## 2015-12-26 DIAGNOSIS — J449 Chronic obstructive pulmonary disease, unspecified: Secondary | ICD-10-CM | POA: Diagnosis not present

## 2015-12-28 DIAGNOSIS — E119 Type 2 diabetes mellitus without complications: Secondary | ICD-10-CM | POA: Diagnosis not present

## 2015-12-28 DIAGNOSIS — I10 Essential (primary) hypertension: Secondary | ICD-10-CM | POA: Diagnosis not present

## 2015-12-28 DIAGNOSIS — J449 Chronic obstructive pulmonary disease, unspecified: Secondary | ICD-10-CM | POA: Diagnosis not present

## 2015-12-28 DIAGNOSIS — D509 Iron deficiency anemia, unspecified: Secondary | ICD-10-CM | POA: Diagnosis not present

## 2015-12-28 DIAGNOSIS — M109 Gout, unspecified: Secondary | ICD-10-CM | POA: Diagnosis not present

## 2015-12-28 DIAGNOSIS — K2211 Ulcer of esophagus with bleeding: Secondary | ICD-10-CM | POA: Diagnosis not present

## 2016-01-02 DIAGNOSIS — E119 Type 2 diabetes mellitus without complications: Secondary | ICD-10-CM | POA: Diagnosis not present

## 2016-01-02 DIAGNOSIS — M109 Gout, unspecified: Secondary | ICD-10-CM | POA: Diagnosis not present

## 2016-01-02 DIAGNOSIS — D509 Iron deficiency anemia, unspecified: Secondary | ICD-10-CM | POA: Diagnosis not present

## 2016-01-02 DIAGNOSIS — I10 Essential (primary) hypertension: Secondary | ICD-10-CM | POA: Diagnosis not present

## 2016-01-02 DIAGNOSIS — K2211 Ulcer of esophagus with bleeding: Secondary | ICD-10-CM | POA: Diagnosis not present

## 2016-01-02 DIAGNOSIS — J449 Chronic obstructive pulmonary disease, unspecified: Secondary | ICD-10-CM | POA: Diagnosis not present

## 2016-01-04 DIAGNOSIS — K2211 Ulcer of esophagus with bleeding: Secondary | ICD-10-CM | POA: Diagnosis not present

## 2016-01-04 DIAGNOSIS — E119 Type 2 diabetes mellitus without complications: Secondary | ICD-10-CM | POA: Diagnosis not present

## 2016-01-04 DIAGNOSIS — M109 Gout, unspecified: Secondary | ICD-10-CM | POA: Diagnosis not present

## 2016-01-04 DIAGNOSIS — D509 Iron deficiency anemia, unspecified: Secondary | ICD-10-CM | POA: Diagnosis not present

## 2016-01-04 DIAGNOSIS — I10 Essential (primary) hypertension: Secondary | ICD-10-CM | POA: Diagnosis not present

## 2016-01-04 DIAGNOSIS — J449 Chronic obstructive pulmonary disease, unspecified: Secondary | ICD-10-CM | POA: Diagnosis not present

## 2016-01-05 DIAGNOSIS — E119 Type 2 diabetes mellitus without complications: Secondary | ICD-10-CM | POA: Diagnosis not present

## 2016-01-05 DIAGNOSIS — I10 Essential (primary) hypertension: Secondary | ICD-10-CM | POA: Diagnosis not present

## 2016-01-05 DIAGNOSIS — D509 Iron deficiency anemia, unspecified: Secondary | ICD-10-CM | POA: Diagnosis not present

## 2016-01-05 DIAGNOSIS — K2211 Ulcer of esophagus with bleeding: Secondary | ICD-10-CM | POA: Diagnosis not present

## 2016-01-05 DIAGNOSIS — M109 Gout, unspecified: Secondary | ICD-10-CM | POA: Diagnosis not present

## 2016-01-05 DIAGNOSIS — J449 Chronic obstructive pulmonary disease, unspecified: Secondary | ICD-10-CM | POA: Diagnosis not present

## 2016-01-09 DIAGNOSIS — D509 Iron deficiency anemia, unspecified: Secondary | ICD-10-CM | POA: Diagnosis not present

## 2016-01-09 DIAGNOSIS — E119 Type 2 diabetes mellitus without complications: Secondary | ICD-10-CM | POA: Diagnosis not present

## 2016-01-09 DIAGNOSIS — I10 Essential (primary) hypertension: Secondary | ICD-10-CM | POA: Diagnosis not present

## 2016-01-09 DIAGNOSIS — M109 Gout, unspecified: Secondary | ICD-10-CM | POA: Diagnosis not present

## 2016-01-09 DIAGNOSIS — J449 Chronic obstructive pulmonary disease, unspecified: Secondary | ICD-10-CM | POA: Diagnosis not present

## 2016-01-09 DIAGNOSIS — K2211 Ulcer of esophagus with bleeding: Secondary | ICD-10-CM | POA: Diagnosis not present

## 2016-01-21 DIAGNOSIS — Z23 Encounter for immunization: Secondary | ICD-10-CM | POA: Diagnosis not present

## 2016-02-09 DIAGNOSIS — Z23 Encounter for immunization: Secondary | ICD-10-CM | POA: Diagnosis not present

## 2016-02-26 ENCOUNTER — Other Ambulatory Visit: Payer: Self-pay | Admitting: Orthopedic Surgery

## 2016-06-12 DIAGNOSIS — K219 Gastro-esophageal reflux disease without esophagitis: Secondary | ICD-10-CM | POA: Diagnosis not present

## 2016-06-12 DIAGNOSIS — G4709 Other insomnia: Secondary | ICD-10-CM | POA: Diagnosis not present

## 2016-06-12 DIAGNOSIS — R350 Frequency of micturition: Secondary | ICD-10-CM | POA: Diagnosis not present

## 2016-06-12 DIAGNOSIS — Z6823 Body mass index (BMI) 23.0-23.9, adult: Secondary | ICD-10-CM | POA: Diagnosis not present

## 2016-06-12 DIAGNOSIS — J189 Pneumonia, unspecified organism: Secondary | ICD-10-CM | POA: Diagnosis not present

## 2016-06-12 DIAGNOSIS — J449 Chronic obstructive pulmonary disease, unspecified: Secondary | ICD-10-CM | POA: Diagnosis not present

## 2016-08-29 DIAGNOSIS — N3281 Overactive bladder: Secondary | ICD-10-CM | POA: Diagnosis not present

## 2016-08-29 DIAGNOSIS — Z6822 Body mass index (BMI) 22.0-22.9, adult: Secondary | ICD-10-CM | POA: Diagnosis not present

## 2016-08-29 DIAGNOSIS — R531 Weakness: Secondary | ICD-10-CM | POA: Diagnosis not present

## 2016-08-29 DIAGNOSIS — M349 Systemic sclerosis, unspecified: Secondary | ICD-10-CM | POA: Diagnosis not present

## 2016-08-29 DIAGNOSIS — R350 Frequency of micturition: Secondary | ICD-10-CM | POA: Diagnosis not present

## 2016-08-29 DIAGNOSIS — J449 Chronic obstructive pulmonary disease, unspecified: Secondary | ICD-10-CM | POA: Diagnosis not present

## 2016-09-04 ENCOUNTER — Encounter (HOSPITAL_COMMUNITY): Payer: Self-pay

## 2016-09-04 ENCOUNTER — Emergency Department (HOSPITAL_COMMUNITY): Payer: Medicare Other

## 2016-09-04 ENCOUNTER — Inpatient Hospital Stay (HOSPITAL_COMMUNITY)
Admission: EM | Admit: 2016-09-04 | Discharge: 2016-09-07 | DRG: 178 | Disposition: A | Discharge status: Home Health | Payer: Medicare Other | Attending: Internal Medicine | Admitting: Internal Medicine

## 2016-09-04 DIAGNOSIS — D509 Iron deficiency anemia, unspecified: Secondary | ICD-10-CM | POA: Diagnosis present

## 2016-09-04 DIAGNOSIS — F329 Major depressive disorder, single episode, unspecified: Secondary | ICD-10-CM | POA: Diagnosis present

## 2016-09-04 DIAGNOSIS — R1319 Other dysphagia: Secondary | ICD-10-CM

## 2016-09-04 DIAGNOSIS — J189 Pneumonia, unspecified organism: Secondary | ICD-10-CM | POA: Diagnosis present

## 2016-09-04 DIAGNOSIS — Z87891 Personal history of nicotine dependence: Secondary | ICD-10-CM | POA: Diagnosis not present

## 2016-09-04 DIAGNOSIS — R112 Nausea with vomiting, unspecified: Secondary | ICD-10-CM | POA: Diagnosis present

## 2016-09-04 DIAGNOSIS — J438 Other emphysema: Secondary | ICD-10-CM | POA: Diagnosis not present

## 2016-09-04 DIAGNOSIS — I482 Chronic atrial fibrillation: Secondary | ICD-10-CM | POA: Diagnosis not present

## 2016-09-04 DIAGNOSIS — K449 Diaphragmatic hernia without obstruction or gangrene: Secondary | ICD-10-CM | POA: Diagnosis not present

## 2016-09-04 DIAGNOSIS — E871 Hypo-osmolality and hyponatremia: Secondary | ICD-10-CM | POA: Diagnosis present

## 2016-09-04 DIAGNOSIS — K2270 Barrett's esophagus without dysplasia: Secondary | ICD-10-CM | POA: Diagnosis present

## 2016-09-04 DIAGNOSIS — R1314 Dysphagia, pharyngoesophageal phase: Secondary | ICD-10-CM | POA: Diagnosis not present

## 2016-09-04 DIAGNOSIS — E860 Dehydration: Secondary | ICD-10-CM | POA: Diagnosis not present

## 2016-09-04 DIAGNOSIS — J181 Lobar pneumonia, unspecified organism: Secondary | ICD-10-CM

## 2016-09-04 DIAGNOSIS — J449 Chronic obstructive pulmonary disease, unspecified: Secondary | ICD-10-CM | POA: Diagnosis present

## 2016-09-04 DIAGNOSIS — M349 Systemic sclerosis, unspecified: Secondary | ICD-10-CM | POA: Diagnosis present

## 2016-09-04 DIAGNOSIS — J690 Pneumonitis due to inhalation of food and vomit: Principal | ICD-10-CM | POA: Diagnosis present

## 2016-09-04 DIAGNOSIS — R1310 Dysphagia, unspecified: Secondary | ICD-10-CM

## 2016-09-04 DIAGNOSIS — R0682 Tachypnea, not elsewhere classified: Secondary | ICD-10-CM | POA: Diagnosis not present

## 2016-09-04 DIAGNOSIS — Z79899 Other long term (current) drug therapy: Secondary | ICD-10-CM

## 2016-09-04 DIAGNOSIS — I10 Essential (primary) hypertension: Secondary | ICD-10-CM | POA: Diagnosis present

## 2016-09-04 DIAGNOSIS — F32A Depression, unspecified: Secondary | ICD-10-CM | POA: Diagnosis present

## 2016-09-04 HISTORY — DX: Unspecified dementia, unspecified severity, without behavioral disturbance, psychotic disturbance, mood disturbance, and anxiety: F03.90

## 2016-09-04 HISTORY — DX: Pneumonia, unspecified organism: J18.9

## 2016-09-04 LAB — URINALYSIS, ROUTINE W REFLEX MICROSCOPIC
Bilirubin Urine: NEGATIVE
GLUCOSE, UA: NEGATIVE mg/dL
Hgb urine dipstick: NEGATIVE
Ketones, ur: NEGATIVE mg/dL
Nitrite: NEGATIVE
PH: 5 (ref 5.0–8.0)
Protein, ur: NEGATIVE mg/dL
SPECIFIC GRAVITY, URINE: 1.014 (ref 1.005–1.030)

## 2016-09-04 LAB — COMPREHENSIVE METABOLIC PANEL
ALK PHOS: 70 U/L (ref 38–126)
ALT: 9 U/L — ABNORMAL LOW (ref 14–54)
ANION GAP: 7 (ref 5–15)
AST: 17 U/L (ref 15–41)
Albumin: 2.8 g/dL — ABNORMAL LOW (ref 3.5–5.0)
BUN: 17 mg/dL (ref 6–20)
CALCIUM: 11.1 mg/dL — AB (ref 8.9–10.3)
CO2: 25 mmol/L (ref 22–32)
Chloride: 98 mmol/L — ABNORMAL LOW (ref 101–111)
Creatinine, Ser: 0.86 mg/dL (ref 0.44–1.00)
Glucose, Bld: 89 mg/dL (ref 65–99)
Potassium: 4.3 mmol/L (ref 3.5–5.1)
SODIUM: 130 mmol/L — AB (ref 135–145)
Total Bilirubin: 0.6 mg/dL (ref 0.3–1.2)
Total Protein: 6.3 g/dL — ABNORMAL LOW (ref 6.5–8.1)

## 2016-09-04 LAB — CBC WITH DIFFERENTIAL/PLATELET
Basophils Absolute: 0 10*3/uL (ref 0.0–0.1)
Basophils Relative: 0 %
EOS ABS: 0.2 10*3/uL (ref 0.0–0.7)
EOS PCT: 1 %
HCT: 28 % — ABNORMAL LOW (ref 36.0–46.0)
HEMOGLOBIN: 9.2 g/dL — AB (ref 12.0–15.0)
LYMPHS ABS: 3.6 10*3/uL (ref 0.7–4.0)
Lymphocytes Relative: 20 %
MCH: 32.7 pg (ref 26.0–34.0)
MCHC: 32.9 g/dL (ref 30.0–36.0)
MCV: 99.6 fL (ref 78.0–100.0)
MONOS PCT: 8 %
Monocytes Absolute: 1.4 10*3/uL — ABNORMAL HIGH (ref 0.1–1.0)
Neutro Abs: 12.8 10*3/uL — ABNORMAL HIGH (ref 1.7–7.7)
Neutrophils Relative %: 71 %
PLATELETS: 397 10*3/uL (ref 150–400)
RBC: 2.81 MIL/uL — ABNORMAL LOW (ref 3.87–5.11)
RDW: 14.7 % (ref 11.5–15.5)
WBC: 18.1 10*3/uL — ABNORMAL HIGH (ref 4.0–10.5)

## 2016-09-04 LAB — PROTIME-INR
INR: 1.12
PROTHROMBIN TIME: 14.5 s (ref 11.4–15.2)

## 2016-09-04 LAB — TYPE AND SCREEN
ABO/RH(D): A POS
ANTIBODY SCREEN: NEGATIVE

## 2016-09-04 LAB — I-STAT CG4 LACTIC ACID, ED: Lactic Acid, Venous: 1.99 mmol/L (ref 0.5–1.9)

## 2016-09-04 LAB — LIPASE, BLOOD: LIPASE: 21 U/L (ref 11–51)

## 2016-09-04 LAB — LACTIC ACID, PLASMA: Lactic Acid, Venous: 1.5 mmol/L (ref 0.5–1.9)

## 2016-09-04 LAB — APTT: APTT: 29 s (ref 24–36)

## 2016-09-04 LAB — POC OCCULT BLOOD, ED: FECAL OCCULT BLD: NEGATIVE

## 2016-09-04 MED ORDER — ALLOPURINOL 100 MG PO TABS
100.0000 mg | ORAL_TABLET | Freq: Two times a day (BID) | ORAL | Status: DC
  Administered 2016-09-05 – 2016-09-07 (×4): 100 mg via ORAL
  Filled 2016-09-04 (×5): qty 1

## 2016-09-04 MED ORDER — CLINDAMYCIN PHOSPHATE 600 MG/50ML IV SOLN
600.0000 mg | Freq: Once | INTRAVENOUS | Status: AC
  Administered 2016-09-04: 600 mg via INTRAVENOUS
  Filled 2016-09-04: qty 50

## 2016-09-04 MED ORDER — ARFORMOTEROL TARTRATE 15 MCG/2ML IN NEBU
15.0000 ug | INHALATION_SOLUTION | Freq: Two times a day (BID) | RESPIRATORY_TRACT | Status: DC
  Administered 2016-09-05 – 2016-09-06 (×4): 15 ug via RESPIRATORY_TRACT
  Filled 2016-09-04 (×5): qty 2

## 2016-09-04 MED ORDER — DONEPEZIL HCL 5 MG PO TABS
10.0000 mg | ORAL_TABLET | Freq: Every day | ORAL | Status: DC
  Administered 2016-09-05 – 2016-09-06 (×2): 10 mg via ORAL
  Filled 2016-09-04 (×2): qty 2

## 2016-09-04 MED ORDER — CLINDAMYCIN PHOSPHATE 600 MG/50ML IV SOLN
600.0000 mg | Freq: Three times a day (TID) | INTRAVENOUS | Status: DC
  Administered 2016-09-05 (×3): 600 mg via INTRAVENOUS
  Filled 2016-09-04 (×5): qty 50

## 2016-09-04 MED ORDER — VITAMIN B-12 100 MCG PO TABS
250.0000 ug | ORAL_TABLET | Freq: Every day | ORAL | Status: DC
  Administered 2016-09-05 – 2016-09-07 (×2): 250 ug via ORAL
  Filled 2016-09-04: qty 1
  Filled 2016-09-04 (×2): qty 3
  Filled 2016-09-04 (×2): qty 1

## 2016-09-04 MED ORDER — CHOLECALCIFEROL 10 MCG (400 UNIT) PO TABS
400.0000 [IU] | ORAL_TABLET | Freq: Every day | ORAL | Status: DC
  Administered 2016-09-07: 400 [IU] via ORAL
  Filled 2016-09-04 (×3): qty 1

## 2016-09-04 MED ORDER — SODIUM CHLORIDE 0.9% FLUSH
3.0000 mL | Freq: Two times a day (BID) | INTRAVENOUS | Status: DC
  Administered 2016-09-04 – 2016-09-07 (×4): 3 mL via INTRAVENOUS

## 2016-09-04 MED ORDER — DILTIAZEM HCL ER COATED BEADS 180 MG PO CP24
180.0000 mg | ORAL_CAPSULE | Freq: Two times a day (BID) | ORAL | Status: DC
  Administered 2016-09-05 – 2016-09-07 (×4): 180 mg via ORAL
  Filled 2016-09-04 (×5): qty 1

## 2016-09-04 MED ORDER — CITALOPRAM HYDROBROMIDE 20 MG PO TABS
20.0000 mg | ORAL_TABLET | Freq: Every day | ORAL | Status: DC
  Administered 2016-09-05 – 2016-09-06 (×2): 20 mg via ORAL
  Filled 2016-09-04 (×2): qty 1

## 2016-09-04 MED ORDER — MIRTAZAPINE 15 MG PO TABS
7.5000 mg | ORAL_TABLET | Freq: Every day | ORAL | Status: DC
  Administered 2016-09-05 – 2016-09-06 (×2): 7.5 mg via ORAL
  Filled 2016-09-04 (×2): qty 1

## 2016-09-04 MED ORDER — ONDANSETRON HCL 4 MG PO TABS: 4.0000 mg | ORAL_TABLET | Freq: Four times a day (QID) | ORAL | Status: DC | PRN

## 2016-09-04 MED ORDER — POLYETHYLENE GLYCOL 3350 17 G PO PACK: 17.0000 g | PACK | Freq: Every day | ORAL | Status: DC | PRN

## 2016-09-04 MED ORDER — TIOTROPIUM BROMIDE MONOHYDRATE 18 MCG IN CAPS
18.0000 ug | ORAL_CAPSULE | Freq: Every day | RESPIRATORY_TRACT | Status: DC
  Administered 2016-09-05 – 2016-09-06 (×2): 18 ug via RESPIRATORY_TRACT
  Filled 2016-09-04: qty 5

## 2016-09-04 MED ORDER — METOPROLOL TARTRATE 25 MG PO TABS
12.5000 mg | ORAL_TABLET | Freq: Two times a day (BID) | ORAL | Status: DC
  Administered 2016-09-05 – 2016-09-07 (×4): 12.5 mg via ORAL
  Filled 2016-09-04 (×5): qty 1

## 2016-09-04 MED ORDER — ALPRAZOLAM 0.25 MG PO TABS
0.2500 mg | ORAL_TABLET | Freq: Three times a day (TID) | ORAL | Status: DC | PRN
  Administered 2016-09-05 – 2016-09-06 (×2): 0.25 mg via ORAL
  Filled 2016-09-04 (×2): qty 1

## 2016-09-04 MED ORDER — SODIUM CHLORIDE 0.9 % IV SOLN
INTRAVENOUS | Status: AC
  Administered 2016-09-04: 22:00:00 via INTRAVENOUS

## 2016-09-04 MED ORDER — ENOXAPARIN SODIUM 40 MG/0.4ML ~~LOC~~ SOLN
40.0000 mg | SUBCUTANEOUS | Status: DC
  Administered 2016-09-04 – 2016-09-05 (×2): 40 mg via SUBCUTANEOUS
  Filled 2016-09-04 (×2): qty 0.4

## 2016-09-04 MED ORDER — MENTHOL (TOPICAL ANALGESIC) 154 MG EX PADS: 1.0000 | MEDICATED_PAD | Freq: Every day | CUTANEOUS | Status: DC | PRN

## 2016-09-04 MED ORDER — ACETAMINOPHEN 325 MG PO TABS: 650.0000 mg | ORAL_TABLET | Freq: Four times a day (QID) | ORAL | Status: DC | PRN

## 2016-09-04 MED ORDER — ONDANSETRON HCL 4 MG/2ML IJ SOLN
4.0000 mg | Freq: Four times a day (QID) | INTRAMUSCULAR | Status: DC | PRN
  Administered 2016-09-05: 4 mg via INTRAVENOUS
  Filled 2016-09-04: qty 2

## 2016-09-04 MED ORDER — LEVOFLOXACIN IN D5W 750 MG/150ML IV SOLN: 750.0000 mg | INTRAVENOUS | Status: DC

## 2016-09-04 MED ORDER — FERROUS SULFATE 325 (65 FE) MG PO TABS
325.0000 mg | ORAL_TABLET | Freq: Every morning | ORAL | Status: DC
  Administered 2016-09-05 – 2016-09-07 (×2): 325 mg via ORAL
  Filled 2016-09-04 (×3): qty 1

## 2016-09-04 MED ORDER — GRX ANALGESIC BALM EX OINT
TOPICAL_OINTMENT | Freq: Every day | CUTANEOUS | Status: DC | PRN
  Filled 2016-09-04: qty 28

## 2016-09-04 MED ORDER — LISINOPRIL 5 MG PO TABS
5.0000 mg | ORAL_TABLET | Freq: Every day | ORAL | Status: DC
  Administered 2016-09-05 – 2016-09-07 (×2): 5 mg via ORAL
  Filled 2016-09-04 (×3): qty 1

## 2016-09-04 MED ORDER — IPRATROPIUM-ALBUTEROL 0.5-2.5 (3) MG/3ML IN SOLN: 3.0000 mL | RESPIRATORY_TRACT | Status: DC | PRN

## 2016-09-04 MED ORDER — UMECLIDINIUM BROMIDE 62.5 MCG/INH IN AEPB
1.0000 | INHALATION_SPRAY | Freq: Every day | RESPIRATORY_TRACT | Status: DC
  Administered 2016-09-05 – 2016-09-06 (×2): 1 via RESPIRATORY_TRACT
  Filled 2016-09-04: qty 7

## 2016-09-04 MED ORDER — SODIUM CHLORIDE 0.9 % IV BOLUS (SEPSIS)
1000.0000 mL | Freq: Once | INTRAVENOUS | Status: AC
  Administered 2016-09-04: 1000 mL via INTRAVENOUS

## 2016-09-04 MED ORDER — LEVOFLOXACIN IN D5W 500 MG/100ML IV SOLN
500.0000 mg | Freq: Once | INTRAVENOUS | Status: AC
  Administered 2016-09-04: 500 mg via INTRAVENOUS
  Filled 2016-09-04: qty 100

## 2016-09-04 MED ORDER — TRAMADOL HCL 50 MG PO TABS: 50.0000 mg | ORAL_TABLET | Freq: Four times a day (QID) | ORAL | Status: DC | PRN

## 2016-09-04 MED ORDER — PANTOPRAZOLE SODIUM 40 MG PO TBEC
40.0000 mg | DELAYED_RELEASE_TABLET | Freq: Two times a day (BID) | ORAL | Status: DC
  Administered 2016-09-05 – 2016-09-07 (×4): 40 mg via ORAL
  Filled 2016-09-04 (×5): qty 1

## 2016-09-04 NOTE — ED Triage Notes (Signed)
Pt arrived via ems with c/o nausea x 2 weeks. Pt alert and oriented.  EMS started IV and cbg 115

## 2016-09-04 NOTE — ED Provider Notes (Signed)
[facility] DEPT Provider Note   CSN: [id number] Arrival date & time: 09/04/16  1425     History   Chief Complaint Chief Complaint  Patient presents with  . Nausea    HPI Tina [name] is a 83 y.o. female.  HPI History list limited due to dementia. Level V caveat. Daughters at bedside to give most of history. Per daughter patient has had decreased oral intake over the last 3 weeks. Complains of early satiety, nausea and upper abdominal pain with eating. Has had increased generalized weakness and difficulty ambulating without assistance. Daughter also states patient has had nonproductive cough. Does not seem to be associated with attempted oral intake. Patient does have dark stools but daughter states she thought this was due to her iron intake. Recently taken off of her blood pressure medication other than lisinopril. Past Medical History:  Diagnosis Date  . Barrett's esophagus   . Chronic diarrhea   . COPD (chronic obstructive pulmonary disease) ([place])   . Dementia   . Essential hypertension, benign   . Gastritis   . Gout   . Helicobacter pylori (H. pylori)   . Irritable bowel syndrome   . Lower abdominal pain   . Pneumonia   . Raynaud's disease   . 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 Scleroderma ([place])   . Sleep apnea    Not on CPAP  . Stroke ([place])   . Type 2 diabetes mellitus [facility])     Patient Active Problem List   Diagnosis Date Noted  . Depression 09/04/2016  . Scleroderma ([place]) 09/04/2016  . Iron deficiency anemia 09/04/2016  . Hyponatremia 09/04/2016  . Nausea & vomiting 09/04/2016  . Upper GI bleeding 11/02/2015  . UGIB (upper gastrointestinal bleed) 11/02/2015  . Pneumonia 09/05/2015  . CAP (community acquired pneumonia) 09/05/2015  . Intertrochanteric fracture of right femur ([place]) 11/04/2013  . Preoperative cardiovascular examination 11/03/2013  . Fall at home 11/02/2013  . Hip fracture, right ([place]) 11/02/2013  . Closed right hip fracture ([place]) 11/02/2013   . Chest pain 05/05/2013  . DM (diabetes mellitus) ([place]) 05/05/2013  . Hypertension 08/01/2011  . High cholesterol 08/01/2011  . Gout 08/01/2011  . Raynaud's disease 08/01/2011  . COPD (chronic obstructive pulmonary disease) ([place]) 08/01/2011  . Barrett's esophagus 08/01/2011  . Esophageal dysphagia 08/01/2011    Past Surgical History:  Procedure Laterality Date  . BACK SURGERY    . CHOLECYSTECTOMY    . COLONOSCOPY  12/2008  . ESOPHAGEAL DILATION N/A 11/02/2015   Procedure: ESOPHAGEAL DILATION;  Surgeon: Rogene Houston, MD;  Location: [facility] END[facility];  Service: Endoscopy;  Laterality: N/A;  . ESOPHAGOGASTRODUODENOSCOPY  06/09  . ESOPHAGOGASTRODUODENOSCOPY  02/02/05   EGD ED  . ESOPHAGOGASTRODUODENOSCOPY  03/08/00   EGD ED  . ESOPHAGOGASTRODUODENOSCOPY N/A 11/02/2015   Procedure: ESOPHAGOGASTRODUODENOSCOPY (EGD);  Surgeon: Rogene Houston, MD;  Location: [facility] ENDO SUITE;  Service: Endoscopy;  Laterality: N/A;  . INTRAMEDULLARY (IM) NAIL INTERTROCHANTERIC Right 11/04/2013   Procedure: INTERNAL FIXATION RIGHT HIP WITH GAMMA NAIL;  Surgeon: Carole Civil, MD;  Location: [facility] ORS;  Service: Orthopedics;  Laterality: Right;  . LUNG SURGERY  2002   Lung collapsed  . PATELLA FRACTURE SURGERY      OB History    No data available       Home Medications    Prior to Admission medications   Medication Sig Start Date End Date Taking? Authorizing Provider  acetaminophen (TYLENOL) 500 MG tablet Take 500 mg by mouth 2 (  two) times daily.   Yes [provider]  allopurinol (ZYLOPRIM) 100 MG tablet Take 100 mg by mouth 3 (three) times daily.    Yes [provider]  ALPRAZolam (XANAX) 0.25 MG tablet Take 1 tablet (0.25 mg total) by mouth 3 (three) times daily as needed for anxiety. Patient taking differently: Take 0.25 mg by mouth 3 (three) times daily as needed for anxiety or sleep.  11/06/13  Yes Black, Lezlie Octave, NP  Cholecalciferol (VITAMIN D PO) Take 1 tablet by mouth every  morning.   Yes [provider]  citalopram (CELEXA) 20 MG tablet Take 20 mg by mouth at bedtime.    Yes [provider]  diltiazem (CARTIA XT) 180 MG 24 hr capsule Take 180 mg by mouth 2 (two) times daily.   Yes [provider]  donepezil (ARICEPT) 10 MG tablet Take 10 mg by mouth at bedtime.   Yes [provider]  escitalopram (LEXAPRO) 10 MG tablet Take 10 mg by mouth daily.   Yes [provider]  ferrous sulfate 325 (65 FE) MG tablet Take 325 mg by mouth every morning.   Yes [provider]  Glycopyrrolate-Formoterol (BEVESPI AEROSPHERE) 9-4.8 MCG/ACT AERO Inhale 2 puffs into the lungs 2 (two) times daily.   Yes [provider]  hydrochlorothiazide (HYDRODIURIL) 25 MG tablet Take 1 tablet (25 mg total) by mouth every morning. Resume on 11/13/13. 11/06/13  Yes Black, Lezlie Octave, NP  lisinopril (PRINIVIL,ZESTRIL) 5 MG tablet Take 5 mg by mouth daily.   Yes [provider]  Menthol, Topical Analgesic, 154 MG PADS Apply 1 each topically daily as needed (for arthritis pain to right arm-shoulder).   Yes [provider]  metoprolol tartrate (LOPRESSOR) 25 MG tablet Take 25 mg by mouth 2 (two) times daily.    Yes [provider]  mirtazapine (REMERON) 30 MG tablet Take 30 mg by mouth at bedtime.    Yes [provider]  nitroGLYCERIN (NITROSTAT) 0.4 MG SL tablet Place 0.4 mg under the tongue every 5 (five) minutes as needed for chest pain.   Yes [provider]  omeprazole (PRILOSEC) 40 MG capsule Take 40 mg by mouth daily.   Yes [provider]  ondansetron (ZOFRAN) 4 MG tablet Take 4 mg by mouth 2 (two) times daily.   Yes [provider]  pantoprazole (PROTONIX) 40 MG tablet Take 1 tablet (40 mg total) by mouth 2 (two) times daily. 11/04/15  Yes Isaac Bliss, Rayford Halsted, MD  SPIRIVA HANDIHALER 18 MCG inhalation capsule USE 1 CAPSULE VIA HANDIHALER ONCE DAILY 07/20/15  Yes [provider]  tolterodine (DETROL) 2 MG tablet Take 2 mg by mouth 2 (two) times daily.   Yes [provider]  traMADol (ULTRAM) 50 MG tablet TAKE 1 TABLET BY MOUTH EVERY 6 HOURS AS NEEDED 02/27/16  Yes Carole Civil, MD  vitamin B-12 (CYANOCOBALAMIN) 250 MCG tablet Take 250 mcg by mouth daily.    Yes [provider]  vitamin E 400 UNIT capsule Take 400 Units by mouth daily.   Yes [provider]  zolpidem (AMBIEN) 10 MG tablet Take 10 mg by mouth at bedtime as needed for sleep.   Yes [provider]    Family History Family History  Problem Relation Age of Onset  . Asthma Father   . Melanoma Brother     Social History Social History  Substance Use Topics  . Smoking status: Former Smoker    Types: Cigarettes  .  Smokeless tobacco: Never Used  . Alcohol use No     Allergies   Penicillins   Review of Systems Review of Systems  Constitutional: Positive for activity change, appetite change and fatigue. Negative for chills and fever.  HENT: Positive for trouble swallowing. Negative for sinus pressure and sore throat.   Respiratory: Positive for cough. Negative for shortness of breath.   Cardiovascular: Negative for chest pain, palpitations and leg swelling.  Gastrointestinal: Positive for abdominal pain and nausea. Negative for blood in stool, constipation, diarrhea and vomiting.  Genitourinary: Negative for dysuria, flank pain and frequency.  Musculoskeletal: Negative for back pain, myalgias and neck pain.  Skin: Negative for rash and wound.  Neurological: Positive for dizziness, weakness (generalized) and light-headedness. Negative for syncope, numbness and headaches.  Psychiatric/Behavioral: Positive for confusion.  All other systems reviewed and are negative.    Physical Exam Updated Vital Signs BP (!) 132/49 (BP Location: Left Arm)   Pulse 84   Temp 98.4 F (36.9 C) (Oral)   Resp 16   Ht 5\' 7"  (1.702 m)   Wt 67.6 kg (149  lb)   SpO2 96%   BMI 23.34 kg/m   Physical Exam  Constitutional: She appears well-developed and well-nourished. No distress.  HENT:  Head: Normocephalic and atraumatic.  Mouth/Throat: Oropharynx is clear and moist. No oropharyngeal exudate.  Eyes: EOM are normal. Pupils are equal, round, and reactive to light.  Neck: Normal range of motion. Neck supple. No JVD present.  Cardiovascular: Normal rate and regular rhythm.  Exam reveals no gallop and no friction rub.   No murmur heard. Pulmonary/Chest: Effort normal and breath sounds normal. No respiratory distress. She has no wheezes. She has no rales. She exhibits no tenderness.  Abdominal: Soft. Bowel sounds are normal. There is tenderness (mild epigastric and left upper quadrant tenderness to palpation. No rebound or guarding.). There is no rebound and no guarding.  Musculoskeletal: Normal range of motion. She exhibits no edema or tenderness.  No lower extremity swelling, asymmetry or tenderness. No CVA tenderness. Chronic deformities to bilateral hands consistent with scleroderma.  Neurological: She is alert.  Patient is oriented to self. She follows simple commands. 5/5 motor in all extremities. Sensation intact.  Skin: Skin is warm and dry. Capillary refill takes less than 2 seconds. No rash noted. No erythema.  Psychiatric: She has a normal mood and affect. Her behavior is normal.  Nursing note and vitals reviewed.    ED Treatments / Results  Labs (all labs ordered are listed, but only abnormal results are displayed) Labs Reviewed  CBC WITH DIFFERENTIAL/PLATELET - Abnormal; Notable for the following:       Result Value   WBC 18.1 (*)    RBC 2.81 (*)    Hemoglobin 9.2 (*)    HCT 28.0 (*)    Neutro Abs 12.8 (*)    Monocytes Absolute 1.4 (*)    All other components within normal limits  COMPREHENSIVE METABOLIC PANEL - Abnormal; Notable for the following:    Sodium 130 (*)    Chloride 98 (*)    Calcium 11.1 (*)    Total  Protein 6.3 (*)    Albumin 2.8 (*)    ALT 9 (*)    All other components within normal limits  URINALYSIS, ROUTINE W REFLEX MICROSCOPIC - Abnormal; Notable for the following:    APPearance CLOUDY (*)    Leukocytes, UA TRACE (*)    Bacteria, UA RARE (*)    Squamous Epithelial /  LPF 6-30 (*)    All other components within normal limits  CBC WITH DIFFERENTIAL/PLATELET - Abnormal; Notable for the following:    WBC 13.3 (*)    RBC 2.78 (*)    Hemoglobin 9.0 (*)    HCT 27.8 (*)    Neutro Abs 9.5 (*)    All other components within normal limits  BASIC METABOLIC PANEL - Abnormal; Notable for the following:    Sodium 132 (*)    Calcium 10.4 (*)    All other components within normal limits  VITAMIN B12 - Abnormal; Notable for the following:    Vitamin B-12 1,573 (*)    All other components within normal limits  BASIC METABOLIC PANEL - Abnormal; Notable for the following:    Sodium 134 (*)    All other components within normal limits  CBC - Abnormal; Notable for the following:    WBC 13.4 (*)    RBC 2.83 (*)    Hemoglobin 9.2 (*)    HCT 28.4 (*)    MCV 100.4 (*)    All other components within normal limits  I-STAT CG4 LACTIC ACID, ED - Abnormal; Notable for the following:    Lactic Acid, Venous 1.99 (*)    All other components within normal limits  CULTURE, BLOOD (ROUTINE X 2)  CULTURE, BLOOD (ROUTINE X 2)  CULTURE, EXPECTORATED SPUTUM-ASSESSMENT  GRAM STAIN  LIPASE, BLOOD  PROTIME-INR  APTT  STREP PNEUMONIAE URINARY ANTIGEN  LACTIC ACID, PLASMA  GLUCOSE, CAPILLARY  GLUCOSE, CAPILLARY  GLUCOSE, CAPILLARY  GLUCOSE, CAPILLARY  GLUCOSE, CAPILLARY  TSH  GLUCOSE, CAPILLARY  GLUCOSE, CAPILLARY  GLUCOSE, CAPILLARY  GLUCOSE, CAPILLARY  GLUCOSE, CAPILLARY  GLUCOSE, CAPILLARY  GLUCOSE, CAPILLARY  GLUCOSE, CAPILLARY  GLUCOSE, CAPILLARY  GLUCOSE, CAPILLARY  GLUCOSE, CAPILLARY  OCCULT BLOOD X 1 CARD TO LAB, STOOL  I-STAT TROPOININ, ED  POC OCCULT BLOOD, ED  TYPE AND SCREEN     EKG  EKG Interpretation  Date/Time:  Tuesday Sep 04 2016 14:39:50 EDT Ventricular Rate:  107 PR Interval:    QRS Duration: 95 QT Interval:  330 QTC Calculation: 441 R Axis:   -22 Text Interpretation:  Sinus tachycardia Borderline left axis deviation Consider anterior infarct No significant change since last tracing Confirmed by Wandra Arthurs [phone]) on 09/05/2016 10:23:04 PM       Radiology Dg Swallowing Func-speech Pathology  Result Date: 09/06/2016 Objective Swallowing Evaluation: Type of Study: MBS-Modified Barium Swallow Study Patient Details Name: Tina [name] MRN: [id number] Date of Birth: [birth year] Today's Date: 09/06/2016 Time: SLP Start Time (ACUTE ONLY): 1330-SLP Stop Time (ACUTE ONLY): 1355 SLP Time Calculation (min) (ACUTE ONLY): 25 min Past Medical History: Past Medical History: Diagnosis Date . Barrett's esophagus  . Chronic diarrhea  . COPD (chronic obstructive pulmonary disease) ([place])  . Dementia  . Essential hypertension, benign  . Gastritis  . Gout  . Helicobacter pylori (H. pylori)  . Irritable bowel syndrome  . Lower abdominal pain  . Pneumonia  . Raynaud's disease  . 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 Scleroderma ([place])  . Sleep apnea   Not on CPAP . Stroke ([place])  . Type 2 diabetes mellitus ([place])  Past Surgical History: Past Surgical History: Procedure Laterality Date . BACK SURGERY   . CHOLECYSTECTOMY   . COLONOSCOPY  12/2008 . ESOPHAGEAL DILATION N/A 11/02/2015  Procedure: ESOPHAGEAL DILATION;  Surgeon: Rogene Houston, MD;  Location[facility] ENDO SU[facility];  Service: Endoscopy;  Laterality: N/A; . ESOPHAGOGASTRODUODENOSCOPY  06/09 . ESOPHAGOGASTRODUODENOSCOPY  02/02/05  EGD ED . ESOPHAGOGASTRODUODENOSCOPY  03/08/00  EGD ED . ESOPHAGOGASTRODUODENOSCOPY N/A 11/02/2015  Procedure: ESOPHAGOGASTRODUODENOSCOPY (EGD);  Surgeon: Rogene Houston, MD;  Location[facility];  Service: Endoscopy;  Laterality: N/A; . INTRAMEDULLARY (IM) NAIL INTERTROCHANTERIC Right 11/04/2013  Procedure: INTERNAL  FIXATION RIGHT HIP WITH GAMMA NAIL;  Surgeon: Carole Civil, MD;  Location: [facility] ORS;  Service: Orthopedics;  Laterality: Right; . LUNG SURGERY  2002  Lung collapsed . PATELLA FRACTURE SURGERY   HPI: JAANAI SALEMI is a 83 y.o. female with medical history significant for hypertension, COPD, dementia, and scleroderma with esophageal dysphagia status post dilations, now presenting to the emergency department with 2 weeks of dysphagia, nausea, vomiting, anorexia, cough, and shortness of breath. Patient had been in her usual state of health until approximately 2 weeks ago when she began having more trouble swallowing and reports that this was accompanied by nausea with occasional vomiting. These symptoms have persisted and may have worsened some over the past 2 weeks, and she has also developed coughing and shortness of breath in recent days. She denies fevers or chills and denies chest pain or palpitations. There has not been any diarrhea and she denies any significant abdominal pain.  Subjective: "I can't get my dentures in." Assessment / Plan / Recommendation CHL IP CLINICAL IMPRESSIONS 09/06/2016 Clinical Impression Pt presents with mild/moderate oral phase dysphagia, mild pharyngeal phase, and suspected primary esophageal phase dysphagia in setting of scleroderma. Pt has extremely limited oral opening due to suspected fibrosis and she was in fact unable to place her dentures for the test. Oral phase was prolonged and pt with delayed anterior posterior transit of bolus and reduced lingual retraction resulting in mild oral residuals necessitating double swallows at times. Pharyngeal phase was timely and without penetration, aspiration, or significant pharyneal residuals across textures and consistencies. Barium tablet was transiently delayed at the UES, but did transfer through UES with additional swallows of thin barium; notable prominent cricopharyngeus with trace backflow of thins into the pyriforms. Esophageal  sweep revealed delayed emptying of esophagus as evidenced by barium filled esophagus for the duration of the study. The barium tablet did pass through the LES. Recommend D3/mech soft and thin liquids with standard aspiration and strict reflux precautions. Pt is at increased risk for post prandial aspiration from possible bolus backflow from esophagus. Pt may benefit from trismus therapy if indicated to facilitate increased maximal incisional opening. Above to Dr. Laural Golden and Pt. SLP will follow while in acute setting. SLP Visit Diagnosis Dysphagia, oropharyngeal phase (R13.12);Dysphagia, pharyngoesophageal phase (R13.14) Attention and concentration deficit following -- Frontal lobe and executive function deficit following -- Impact on safety and function Mild aspiration risk   CHL IP TREATMENT RECOMMENDATION 09/06/2016 Treatment Recommendations Therapy as outlined in treatment plan below   Prognosis 09/06/2016 Prognosis for Safe Diet Advancement Good Barriers to Reach Goals Severity of deficits Barriers/Prognosis Comment -- CHL IP DIET RECOMMENDATION 09/06/2016 SLP Diet Recommendations Dysphagia 3 (Mech soft) solids;Thin liquid Liquid Administration via Cup;Straw Medication Administration Crushed with puree Compensations Multiple dry swallows after each bite/sip Postural Changes Remain semi-upright after after feeds/meals (Comment);Seated upright at 90 degrees   CHL IP OTHER RECOMMENDATIONS 09/06/2016 Recommended Consults Consider esophageal assessment Oral Care Recommendations Oral care BID;Patient independent with oral care Other Recommendations Clarify dietary restrictions   CHL IP FOLLOW UP RECOMMENDATIONS 09/06/2016 Follow up Recommendations None   CHL IP FREQUENCY AND DURATION 09/06/2016 Speech Therapy Frequency (ACUTE ONLY) min 2x/week Treatment Duration 1 week  CHL IP ORAL PHASE 09/06/2016 Oral Phase Impaired Oral - Pudding Teaspoon -- Oral - Pudding Cup -- Oral - Honey Teaspoon -- Oral - Honey Cup -- Oral -  Nectar Teaspoon -- Oral - Nectar Cup -- Oral - Nectar Straw -- Oral - Thin Teaspoon -- Oral - Thin Cup Weak lingual manipulation;Incomplete tongue to palate contact;Lingual/palatal residue Oral - Thin Straw -- Oral - Puree -- Oral - Mech Soft Impaired mastication;Weak lingual manipulation;Lingual/palatal residue;Delayed oral transit;Decreased bolus cohesion Oral - Regular -- Oral - Multi-Consistency -- Oral - Pill Delayed oral transit;Reduced posterior propulsion Oral Phase - Comment lingual residuals trickle down to valleculae  CHL IP PHARYNGEAL PHASE 09/06/2016 Pharyngeal Phase Impaired Pharyngeal- Pudding Teaspoon -- Pharyngeal -- Pharyngeal- Pudding Cup -- Pharyngeal -- Pharyngeal- Honey Teaspoon -- Pharyngeal -- Pharyngeal- Honey Cup -- Pharyngeal -- Pharyngeal- Nectar Teaspoon -- Pharyngeal -- Pharyngeal- Nectar Cup -- Pharyngeal -- Pharyngeal- Nectar Straw -- Pharyngeal -- Pharyngeal- Thin Teaspoon -- Pharyngeal -- Pharyngeal- Thin Cup Pharyngeal residue - valleculae;Pharyngeal residue - cp segment;Reduced tongue base retraction Pharyngeal -- Pharyngeal- Thin Straw -- Pharyngeal -- Pharyngeal- Puree -- Pharyngeal -- Pharyngeal- Mechanical Soft -- Pharyngeal -- Pharyngeal- Regular -- Pharyngeal -- Pharyngeal- Multi-consistency -- Pharyngeal -- Pharyngeal- Pill -- Pharyngeal -- Pharyngeal Comment --  CHL IP CERVICAL ESOPHAGEAL PHASE 09/06/2016 Cervical Esophageal Phase Impaired Pudding Teaspoon -- Pudding Cup -- Honey Teaspoon -- Honey Cup -- Nectar Teaspoon -- Nectar Cup -- Nectar Straw -- Thin Teaspoon -- Thin Cup -- Thin Straw -- Puree Prominent cricopharyngeal segment Mechanical Soft -- Regular -- Multi-consistency -- Pill -- Cervical Esophageal Comment reduced emptying noted in esophagus Thank you, Genene Churn, [place] No flowsheet data found. PORTER,DABNEY 09/06/2016, 5:59 PM               Procedures Procedures (including critical care time)  Medications Ordered in ED Medications  0.9  %  sodium chloride infusion ( Intravenous Stopped 09/05/16 0741)  traMADol (ULTRAM) tablet 50 mg ( Oral MAR Unhold 09/07/16 0901)  acetaminophen (TYLENOL) tablet 650 mg ( Oral MAR Unhold 09/07/16 0901)  pantoprazole (PROTONIX) EC tablet 40 mg (40 mg Oral Given 09/07/16 1036)  umeclidinium bromide (INCRUSE ELLIPTA) 62.5 MCG/INH 1 puff ( Inhalation MAR Unhold 09/07/16 0901)  mirtazapine (REMERON) tablet 7.5 mg ( Oral MAR Unhold 09/07/16 0901)  tiotropium (SPIRIVA) inhalation capsule 18 mcg ( Inhalation MAR Unhold 09/07/16 0901)  lisinopril (PRINIVIL,ZESTRIL) tablet 5 mg (5 mg Oral Given 09/07/16 1035)  vitamin B-12 (CYANOCOBALAMIN) tablet 250 mcg (250 mcg Oral Given 09/07/16 1000)  ALPRAZolam (XANAX) tablet 0.25 mg ( Oral MAR Unhold 09/07/16 0901)  diltiazem (CARDIZEM CD) 24 hr capsule 180 mg (180 mg Oral Given 09/07/16 1000)  donepezil (ARICEPT) tablet 10 mg ( Oral MAR Unhold 09/07/16 0901)  cholecalciferol (VITAMIN D) tablet 400 Units (400 Units Oral Given 09/07/16 1035)  allopurinol (ZYLOPRIM) tablet 100 mg (100 mg Oral Given 09/07/16 1035)  citalopram (CELEXA) tablet 20 mg ( Oral MAR Unhold 09/07/16 0901)  metoprolol tartrate (LOPRESSOR) tablet 12.5 mg (12.5 mg Oral Given 09/07/16 1035)  ferrous sulfate tablet 325 mg (325 mg Oral Given 09/07/16 1000)  sodium chloride flush (NS) 0.9 % injection 3 mL (3 mLs Intravenous Given 09/07/16 1000)  ondansetron (ZOFRAN) tablet 4 mg ( Oral MAR Unhold 09/07/16 0901)    Or  ondansetron (ZOFRAN) injection 4 mg ( Intravenous MAR Unhold 09/07/16 0901)  polyethylene glycol (MIRALAX / GLYCOLAX) packet 17 g ( Oral MAR Unhold 09/07/16 0901)  ipratropium-albuterol (DUONEB) 0.5-2.5 (3) MG/3ML  nebulizer solution 3 mL ( Nebulization MAR Unhold 09/07/16 0901)  arformoterol (BROVANA) nebulizer solution 15 mcg ( Nebulization MAR Unhold 09/07/16 0901)  GRX ANALGESIC BALM OINT ( Topical MAR Unhold 09/07/16 0901)  0.9 %  sodium chloride infusion ( Intravenous New Bag/Given 09/07/16 0514)  clindamycin (CLEOCIN) IVPB  600 mg ( Intravenous MAR Unhold 09/07/16 0901)  midazolam (VERSED) 5 MG/5ML injection (not administered)  lidocaine (XYLOCAINE) 2 % viscous mouth solution (not administered)  sodium chloride 0.9 % bolus 1,000 mL (0 mLs Intravenous Stopped 09/04/16 1842)  levofloxacin (LEVAQUIN) IVPB 500 mg (0 mg Intravenous Stopped 09/04/16 1842)  clindamycin (CLEOCIN) IVPB 600 mg (0 mg Intravenous Stopped 09/04/16 1737)  dextrose 50 % solution (25 mLs  Given 09/05/16 0805)     Initial Impression / Assessment and Plan / ED Course  I have reviewed the triage vital signs and the nursing notes.  Pertinent labs & imaging results that were available during my care of the patient were reviewed by me and considered in my medical decision making (see chart for details).     83 year old female with evidence of right lower lobe pneumonia. Leukocytosis. Given dysphasia and esophageal strictures in the past, concern for aspiration pneumonia. Given clindamycin as well as Levaquin. Discuss with hospitalist and will admit.  Final Clinical Impressions(s) / ED Diagnoses   Final diagnoses:  Community acquired pneumonia of right lower lobe of lung [facility])  Esophageal dysphagia    New Prescriptions Current Discharge Medication List       Julianne Rice, MD 09/07/16 1107

## 2016-09-04 NOTE — H&P (Signed)
History and Physical    Tina [name] XNA:[id number] DOB: [birth year] DOA: 09/04/2016  PCP: Sharilyn Sites, MD   Patient coming from: Home  Chief Complaint: Cough, SOB, nausea, vomiting, dysphagia  HPI: Tina [name] is a 83 y.o. female with medical history significant for hypertension, COPD, dementia, and scleroderma with esophageal dysphagia status post dilations, now presenting to the emergency department with 2 weeks of dysphagia, nausea, vomiting, anorexia, cough, and shortness of breath. Patient had been in her usual state of health until approximately 2 weeks ago when she began having more trouble swallowing and reports that this was accompanied by nausea with occasional vomiting. These symptoms have persisted and may have worsened some over the past 2 weeks, and she has also developed coughing and shortness of breath in recent days. She denies fevers or chills and denies chest pain or palpitations. There has not been any diarrhea and she denies any significant abdominal pain.   ED Course: Upon arrival to the ED, patient is found to be afebrile, saturating adequately on room air, tachypneic, slightly tachycardic, and with stable blood pressure. EKG features sinus tachycardia with rate 107 and anterior Q wave. Chest x-ray is notable for a left basilar density consistent with pneumonia. Chemistry panels notable for a sodium of 1:30 and albumin of 2.8. CBC features a leukocytosis to 18,100 and a stable normocytic anemia with hemoglobin 9.2. Lactic acid is slightly elevated to 1.99 and urinalysis is not suggestive of infection. Blood cultures were obtained, type and screen was performed, FOBT was negative, patient was treated with 1 L of normal saline, and given empiric Levaquin and clindamycin for pneumonia with suspicion for aspiration. Patient remained hemodynamically stable in the ED and appears dyspneic, but not in acute distress, and she will be admitted to the telemetry unit for ongoing  evaluation and management of dysphagia with nausea, vomiting, and left basilar pneumonia suspicious for aspiration.  Review of Systems:  All other systems reviewed and apart from HPI, are negative.  Past Medical History:  Diagnosis Date  . Barrett's esophagus   . Chronic diarrhea   . COPD (chronic obstructive pulmonary disease) ([place])   . Dementia   . Essential hypertension, benign   . Gastritis   . Gout   . Helicobacter pylori (H. pylori)   . Irritable bowel syndrome   . Lower abdominal pain   . Pneumonia   . Raynaud's disease   . 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 Scleroderma ([place])   . Sleep apnea    Not on CPAP  . Stroke ([place])   . Type 2 diabetes mellitus ([place])     Past Surgical History:  Procedure Laterality Date  . BACK SURGERY    . CHOLECYSTECTOMY    . COLONOSCOPY  12/2008  . ESOPHAGEAL DILATION N/A 11/02/2015   Procedure: ESOPHAGEAL DILATION;  Surgeon: Rogene Houston, MD;  Location: [facility];  Service: Endoscopy;  Laterality: N/A;  . ESOPHAGOGASTRODUODENOSCOPY  06/09  . ESOPHAGOGASTRODUODENOSCOPY  02/02/05   EGD ED  . ESOPHAGOGASTRODUODENOSCOPY  03/08/00   EGD ED  . ESOPHAGOGASTRODUODENOSCOPY N/A 11/02/2015   Procedure: ESOPHAGOGASTRODUODENOSCOPY (EGD);  Surgeon: Rogene Houston, MD;  Location: [facility];  Service: Endoscopy;  Laterality: N/A;  . INTRAMEDULLARY (IM) NAIL INTERTROCHANTERIC Right 11/04/2013   Procedure: INTERNAL FIXATION RIGHT HIP WITH GAMMA NAIL;  Surgeon: Carole Civil, MD;  Location: [facility];  Service: Orthopedics;  Laterality: Right;  . LUNG SURGERY  2002   Lung collapsed  . PATELLA FRACTURE  SURGERY       reports that she has quit smoking. Her smoking use included Cigarettes. She has never used smokeless tobacco. She reports that she does not drink alcohol or use drugs.  Allergy: Penicillin (reaction unknown)  Family History  Problem Relation Age of Onset  . Asthma Father   . Melanoma Brother      Prior to Admission medications     Medication Sig Start Date End Date Taking? Authorizing Provider  acetaminophen (TYLENOL) 500 MG tablet Take 500 mg by mouth 2 (two) times daily.    [provider]  allopurinol (ZYLOPRIM) 100 MG tablet Take 100 mg by mouth 2 (two) times daily.     [provider]  ALPRAZolam Duanne Moron) 0.25 MG tablet Take 1 tablet (0.25 mg total) by mouth 3 (three) times daily as needed for anxiety. Patient taking differently: Take 0.25 mg by mouth 3 (three) times daily as needed for anxiety or sleep.  11/06/13   Black, Lezlie Octave, NP  Cholecalciferol (VITAMIN D PO) Take 1 tablet by mouth every morning.    [provider]  citalopram (CELEXA) 20 MG tablet Take 20 mg by mouth at bedtime.     [provider]  diltiazem (CARTIA XT) 180 MG 24 hr capsule Take 180 mg by mouth 2 (two) times daily.    [provider]  donepezil (ARICEPT) 10 MG tablet Take 10 mg by mouth at bedtime.    [provider]  ferrous sulfate 325 (65 FE) MG tablet Take 325 mg by mouth every morning.    [provider]  Glycopyrrolate-Formoterol (BEVESPI AEROSPHERE) 9-4.8 MCG/ACT AERO Inhale 2 puffs into the lungs 2 (two) times daily.    [provider]  hydrochlorothiazide (HYDRODIURIL) 25 MG tablet Take 1 tablet (25 mg total) by mouth every morning. Resume on 11/13/13. 11/06/13   Radene Gunning, NP  lisinopril (PRINIVIL,ZESTRIL) 5 MG tablet Take 5 mg by mouth daily.    [provider]  Menthol, Topical Analgesic, 154 MG PADS Apply 1 each topically daily as needed (for arthritis pain to right arm-shoulder).    [provider]  metoprolol tartrate (LOPRESSOR) 25 MG tablet Take 12.5 mg by mouth 2 (two) times daily.     [provider]  mirtazapine (REMERON) 15 MG tablet Take 7.5 mg by mouth at bedtime.    [provider]  nitroGLYCERIN (NITROSTAT) 0.4 MG SL tablet Place 0.4 mg under the tongue every 5 (five) minutes as needed for chest pain.    [provider]  pantoprazole (PROTONIX) 40 MG tablet Take 1 tablet (40 mg total) by mouth 2 (two) times daily. 11/04/15   Isaac Bliss, Rayford Halsted, MD  SPIRIVA HANDIHALER 18 MCG inhalation capsule USE 1 CAPSULE VIA HANDIHALER ONCE DAILY 07/20/15   [provider]  traMADol (ULTRAM) 50 MG tablet TAKE 1 TABLET BY MOUTH EVERY 6 HOURS AS NEEDED 02/27/16   Carole Civil, MD  vitamin B-12 (CYANOCOBALAMIN) 250 MCG tablet Take 250 mcg by mouth daily.     [provider]  vitamin E 400 UNIT capsule Take 400 Units by mouth daily.    [provider]    Physical Exam: Vitals:   09/04/16 1700 09/04/16 1730 09/04/16 1800 09/04/16 1842  BP: 126/61 (!) 132/53 136/62 (!) 122/57  Pulse:    (!) 114  Resp: (!) 22 (!) 21 19 (!) 23  Temp:      TempSrc:      SpO2:  100%  Weight:      Height:          Constitutional: No acute distress. Mild tachypnea, appears uncomfortable, coughing. No pallor or diaphoresis.  Eyes: PERTLA, lids and conjunctivae normal ENMT: Mucous membranes are moist. Posterior pharynx clear of any exudate or lesions.   Neck: normal, supple, no masses, no thyromegaly Respiratory: Diminished in left base, no wheezing, no crackles. Mild dyspnea with speech. No accessory muscle use.    Cardiovascular: Rate ~110 and regular. No extremity edema. No significant JVD. Abdomen: No distension, no tenderness, no masses palpated. Bowel sounds active.  Musculoskeletal: no clubbing / cyanosis. No joint deformity upper and lower extremities.    Skin: no significant rashes, lesions, ulcers. Warm, dry, well-perfused. Poor turgor.  Neurologic: CN 2-12 grossly intact. Sensation intact, DTR normal. Strength 5/5 in all 4 limbs.  Psychiatric: Alert and oriented to person, place, and situation, but not oriented to month or year. Pleasant and cooperative.     Labs on Admission: I have personally reviewed following labs and imaging studies  CBC:  Recent Labs Lab  09/04/16 1558  WBC 18.1*  NEUTROABS 12.8*  HGB 9.2*  HCT 28.0*  MCV 99.6  PLT 102   Basic Metabolic Panel:  Recent Labs Lab 09/04/16 1558  NA 130*  K 4.3  CL 98*  CO2 25  GLUCOSE 89  BUN 17  CREATININE 0.86  CALCIUM 11.1*   GFR: Estimated Creatinine Clearance: 49.9 mL/min (by C-G formula based on SCr of 0.86 mg/dL). Liver Function Tests:  Recent Labs Lab 09/04/16 1558  AST 17  ALT 9*  ALKPHOS 70  BILITOT 0.6  PROT 6.3*  ALBUMIN 2.8*    Recent Labs Lab 09/04/16 1558  LIPASE 21   No results for input(s): AMMONIA in the last 168 hours. Coagulation Profile:  Recent Labs Lab 09/04/16 1558  INR 1.12   Cardiac Enzymes: No results for input(s): CKTOTAL, CKMB, CKMBINDEX, TROPONINI in the last 168 hours. BNP (last 3 results) No results for input(s): PROBNP in the last 8760 hours. HbA1C: No results for input(s): HGBA1C in the last 72 hours. CBG: No results for input(s): GLUCAP in the last 168 hours. Lipid Profile: No results for input(s): CHOL, HDL, LDLCALC, TRIG, CHOLHDL, LDLDIRECT in the last 72 hours. Thyroid Function Tests: No results for input(s): TSH, T4TOTAL, FREET4, T3FREE, THYROIDAB in the last 72 hours. Anemia Panel: No results for input(s): VITAMINB12, FOLATE, FERRITIN, TIBC, IRON, RETICCTPCT in the last 72 hours. Urine analysis:    Component Value Date/Time   COLORURINE YELLOW 09/04/2016 1523   APPEARANCEUR CLOUDY (A) 09/04/2016 1523   LABSPEC 1.014 09/04/2016 1523   PHURINE 5.0 09/04/2016 1523   GLUCOSEU NEGATIVE 09/04/2016 1523   HGBUR NEGATIVE 09/04/2016 1523   BILIRUBINUR NEGATIVE 09/04/2016 1523   KETONESUR NEGATIVE 09/04/2016 1523   PROTEINUR NEGATIVE 09/04/2016 1523   UROBILINOGEN 0.2 04/25/2014 2025   NITRITE NEGATIVE 09/04/2016 1523   LEUKOCYTESUR TRACE (A) 09/04/2016 1523   Sepsis Labs: @LABRCNTIP (procalcitonin:4,lacticidven:4) ) Recent Results (from the past 240 hour(s))  Culture, blood (Routine X 2) w Reflex to ID Panel      Status: None (Preliminary result)   Collection Time: 09/04/16  3:59 PM  Result Value Ref Range Status   Specimen Description BLOOD RIGHT ARM  Final   Special Requests   Final    BOTTLES DRAWN AEROBIC AND ANAEROBIC Blood Culture results may not be optimal due to an inadequate volume of blood received in culture bottles   Culture PENDING  Incomplete   Report Status PENDING  Incomplete  Culture, blood (Routine X 2) w Reflex to ID Panel     Status: None (Preliminary result)   Collection Time: 09/04/16  4:08 PM  Result Value Ref Range Status   Specimen Description BLOOD RIGHT ARM  Final   Special Requests   Final    BOTTLES DRAWN AEROBIC AND ANAEROBIC Blood Culture results may not be optimal due to an inadequate volume of blood received in culture bottles   Culture PENDING  Incomplete   Report Status PENDING  Incomplete     Radiological Exams on Admission: Dg Chest Port 1 View  Addendum Date: 09/04/2016   ADDENDUM REPORT: 09/04/2016 16:05 ADDENDUM: In the body of the report I erroneously attributed the CT scan which was use for comparison to today's date when in fact it was from May 29th 2017. The density at the left lung base on today's chest x-ray is more conspicuous than on the CT scan of 1 year ago and likely does reflect acute pneumonia. The pleural plaque disease is of course a chronic process. The findings were discussed by telephone with Dr. Lita Mains. Electronically Signed   By: David  Martinique M.D.   On: 09/04/2016 16:05   Result Date: 09/04/2016 CLINICAL DATA:  Two weeks of nausea EXAM: PORTABLE CHEST 1 VIEW COMPARISON:  CT scan of the chest of today's date FINDINGS: The lungs are reasonably well inflated. There is abnormal density in the left mid and lower lung consistent with known pneumonia. There is calcified pleural plaque disease in the left correction right mid hemithorax. There is surgical suture as well in the right mid lung. The heart is normal in size. The pulmonary  vascularity is not engorged. There is dense calcification in the wall of the thoracic aorta. IMPRESSION: Left lower lobe pneumonia demonstrated best on today's CT scan. Followup PA and lateral chest X-ray is recommended in 3-4 weeks following trial of antibiotic therapy to ensure resolution and exclude underlying malignancy. Pleural plaque on the right consistent with previous asbestos exposure. Thoracic aortic atherosclerosis. Electronically Signed: By: David  Martinique M.D. On: 09/04/2016 15:53    EKG: Independently reviewed. Sinus tachycardia (rate 107), anterior Q-wave.   Assessment/Plan  1. Pneumonia  - Pt presents with 2 wks dysphagia, several days cough and dyspnea  - She is found to have WBC 18,100 and left basilar consolidation consistent with PNA  - Given the clinical scenario, aspiration is a strong consideration  - Blood cultures obtained in ED and empiric abx started with Levaquin and clindamycin  - Plan to check sputum culture and strep pneumo urinary antigen, keep NPO, request SLP eval, provide IVF hydration, and continue empiric abx with Levaquin and clindamycin    2. Dysphagia  - Pt has hx of scleroderma and has required esophageal dilations in the past, none in past year  - She presents with 2 wks of progressive dysphagia with wt-loss and suspected aspiration PNA - Keep NPO pending SLP eval, continue IVF hydration    3. COPD  - No wheezing or significant distress on admission  - Continue inhalers, prn DuoNeb  4. Hyponatremia  - Serum sodium is 130 on admission in setting of hypovolemia  - Pt reports 2 wks dysphagia and nausea with anorexia and wt-loss  - She was given 1 liter NS in ED and will be continued on NS infusion  - Repeat chem panel in am   5. Iron-deficiency anemia  - Hgb is stable at 9.2 with  negative FOBT  - Continue iron and B-12 supplementation    6. Hypertension - BP at goal  - Continue lisinopril and Lopressor    DVT prophylaxis: sq Lovenox  Code  Status: DNR Family Communication: Discussed with patient Disposition Plan: Admit to telemetry Consults called: None  Admission status: Inpatient    Vianne Bulls, MD [facility] Pager [phone]  If 7PM-7AM, please contact night-coverage [email or web address] Password [email or web address]  09/04/2016, 7:25 PM

## 2016-09-05 ENCOUNTER — Encounter (HOSPITAL_COMMUNITY): Payer: Self-pay

## 2016-09-05 DIAGNOSIS — J449 Chronic obstructive pulmonary disease, unspecified: Secondary | ICD-10-CM

## 2016-09-05 DIAGNOSIS — R1310 Dysphagia, unspecified: Secondary | ICD-10-CM

## 2016-09-05 DIAGNOSIS — J690 Pneumonitis due to inhalation of food and vomit: Principal | ICD-10-CM

## 2016-09-05 LAB — BASIC METABOLIC PANEL
ANION GAP: 7 (ref 5–15)
BUN: 11 mg/dL (ref 6–20)
CALCIUM: 10.4 mg/dL — AB (ref 8.9–10.3)
CO2: 24 mmol/L (ref 22–32)
Chloride: 101 mmol/L (ref 101–111)
Creatinine, Ser: 0.77 mg/dL (ref 0.44–1.00)
Glucose, Bld: 81 mg/dL (ref 65–99)
Potassium: 4.3 mmol/L (ref 3.5–5.1)
SODIUM: 132 mmol/L — AB (ref 135–145)

## 2016-09-05 LAB — CBC WITH DIFFERENTIAL/PLATELET
BASOS PCT: 0 %
Basophils Absolute: 0 10*3/uL (ref 0.0–0.1)
EOS ABS: 0.2 10*3/uL (ref 0.0–0.7)
EOS PCT: 1 %
HCT: 27.8 % — ABNORMAL LOW (ref 36.0–46.0)
Hemoglobin: 9 g/dL — ABNORMAL LOW (ref 12.0–15.0)
Lymphocytes Relative: 20 %
Lymphs Abs: 2.7 10*3/uL (ref 0.7–4.0)
MCH: 32.4 pg (ref 26.0–34.0)
MCHC: 32.4 g/dL (ref 30.0–36.0)
MCV: 100 fL (ref 78.0–100.0)
MONO ABS: 1 10*3/uL (ref 0.1–1.0)
Monocytes Relative: 7 %
Neutro Abs: 9.5 10*3/uL — ABNORMAL HIGH (ref 1.7–7.7)
Neutrophils Relative %: 72 %
PLATELETS: 379 10*3/uL (ref 150–400)
RBC: 2.78 MIL/uL — ABNORMAL LOW (ref 3.87–5.11)
RDW: 14.8 % (ref 11.5–15.5)
WBC: 13.3 10*3/uL — ABNORMAL HIGH (ref 4.0–10.5)

## 2016-09-05 LAB — GLUCOSE, CAPILLARY
GLUCOSE-CAPILLARY: 68 mg/dL (ref 65–99)
GLUCOSE-CAPILLARY: 72 mg/dL (ref 65–99)
GLUCOSE-CAPILLARY: 78 mg/dL (ref 65–99)
GLUCOSE-CAPILLARY: 83 mg/dL (ref 65–99)
GLUCOSE-CAPILLARY: 93 mg/dL (ref 65–99)
Glucose-Capillary: 67 mg/dL (ref 65–99)
Glucose-Capillary: 87 mg/dL (ref 65–99)

## 2016-09-05 LAB — TSH: TSH: 2.53 u[IU]/mL (ref 0.350–4.500)

## 2016-09-05 LAB — STREP PNEUMONIAE URINARY ANTIGEN: STREP PNEUMO URINARY ANTIGEN: NEGATIVE

## 2016-09-05 MED ORDER — CLINDAMYCIN PHOSPHATE 300 MG/50ML IV SOLN
300.0000 mg | Freq: Once | INTRAVENOUS | Status: DC
Start: 2016-09-05 — End: 2016-09-05
  Filled 2016-09-05: qty 50

## 2016-09-05 MED ORDER — CLINDAMYCIN PHOSPHATE 600 MG/50ML IV SOLN
INTRAVENOUS | Status: AC
  Filled 2016-09-05: qty 50

## 2016-09-05 MED ORDER — CLINDAMYCIN PHOSPHATE 900 MG/50ML IV SOLN
900.0000 mg | Freq: Three times a day (TID) | INTRAVENOUS | Status: DC
  Filled 2016-09-05 (×3): qty 50

## 2016-09-05 MED ORDER — CLINDAMYCIN PHOSPHATE 600 MG/50ML IV SOLN
600.0000 mg | Freq: Three times a day (TID) | INTRAVENOUS | Status: DC
  Administered 2016-09-06 – 2016-09-07 (×4): 600 mg via INTRAVENOUS
  Filled 2016-09-05 (×11): qty 50

## 2016-09-05 MED ORDER — VITAMIN B-12 100 MCG PO TABS: 250.0000 ug | ORAL_TABLET | Freq: Every day | ORAL | Status: DC

## 2016-09-05 MED ORDER — DEXTROSE 50 % IV SOLN
INTRAVENOUS | Status: AC
  Administered 2016-09-05: 25 mL
  Filled 2016-09-05: qty 50

## 2016-09-05 MED ORDER — SODIUM CHLORIDE 0.9 % IV SOLN
INTRAVENOUS | Status: DC
  Administered 2016-09-05 – 2016-09-07 (×3): via INTRAVENOUS

## 2016-09-05 NOTE — Evaluation (Signed)
Physical Therapy Evaluation Patient Details Name: Tina [name] MRN: [id number] DOB: 19-Apr-1933 Today's Date: 09/05/2016   History of Present Illness  Tina [name] is a 83 y.o. female with medical history significant for hypertension, COPD, dementia, and scleroderma with esophageal dysphagia status post dilations, now presenting to the emergency department with 2 weeks of dysphagia, nausea, vomiting, anorexia, cough, and shortness of breath. Patient had been in her usual state of health until approximately 2 weeks ago when she began having more trouble swallowing and reports that this was accompanied by nausea with occasional vomiting. These symptoms have persisted and may have worsened some over the past 2 weeks, and she has also developed coughing and shortness of breath in recent days. She denies fevers or chills and denies chest pain or palpitations. There has not been any diarrhea and she denies any significant abdominal pain.   Clinical Impression  Pt received lying in bed and was agreeable to PT evaluation. SLP entered at 2:25 and was present for rest of session. Per pt, she's from home where she is alone during the day, but has family spend the night 7 days/week. Per pt, she uses rollator at baseline for household and community ambulation and is independent with ADLs. Pt currently mod I for bed mobility with HOB elevated, requires supv to min A for sit <> stand transfers, depending on height (increased difficulty lowering and rising from toilet), and requires supv for amb with RW in room. Pt gait pattern impaired as she had slow gait speed and step-to pattern with RW, and she stepped outside of the RW BOS a few times throughout amb. Pt c/o lightheadedness once sitting EOB, which resolved after SLP gave her some Jell-O. Pt currently functioning below baseline and PT recommends HHPT upon d/c to improve pt's overall strength and decrease risk for falls.    Follow Up Recommendations Home health  PT    Equipment Recommendations  None recommended by PT    Recommendations for Other Services       Precautions / Restrictions Precautions Precautions: Fall Precaution Comments: due to c/o lightheadedness, IV pole, O2, and mild generalized weakness      Mobility  Bed Mobility Overal bed mobility: Modified Independent             General bed mobility comments: increased time supine > sit with HOB elevated  Transfers Overall transfer level: Needs assistance Equipment used: Rolling walker (2 wheeled) Transfers: Sit to/from Stand Sit to Stand: Supervision;Min assist         General transfer comment: supv from bed, min A from toilet   Ambulation/Gait Ambulation/Gait assistance: Supervision Ambulation Distance (Feet): 15 Feet Assistive device: Rolling walker (2 wheeled) Gait Pattern/deviations: Step-to pattern;Decreased stride length;Decreased weight shift to right;Decreased weight shift to left   Gait velocity interpretation: <1.8 ft/sec, indicative of risk for recurrent falls General Gait Details: amb 74ft in room bed > toilet > chair with RW; pt required supv due to decreased gait speed, c/o lightheadedness, as well as oxygen tank and IV pole.  Stairs            Wheelchair Mobility    Modified Rankin (Stroke Patients Only)       Balance Overall balance assessment: Needs assistance Sitting-balance support: No upper extremity supported Sitting balance-Leahy Scale: Good     Standing balance support: Bilateral upper extremity supported Standing balance-Leahy Scale: Fair  Pertinent Vitals/Pain Pain Assessment: No/denies pain    Home Living Family/patient expects to be discharged to:: Private residence Living Arrangements: Children (pt is alone during the day, but her dtr and son in law stay with her overnight every night) Available Help at Discharge: Family Type of Home: House Home Access: Level entry      Home Layout: One level Home Equipment: Environmental consultant - 4 wheels;Cane - single point;Grab bars - toilet;Shower seat Additional Comments: Per chart, pt has baseline cognitive deficits so information provided may not be accurate.    Prior Function Level of Independence: Independent with assistive device(s)         Comments: Pt states she was using the rollator for household and community. She states she was dressing and bathing independently.     Hand Dominance   Dominant Hand: Right    Extremity/Trunk Assessment   Upper Extremity Assessment Upper Extremity Assessment: Overall WFL for tasks assessed    Lower Extremity Assessment Lower Extremity Assessment: Generalized weakness    Cervical / Trunk Assessment Cervical / Trunk Assessment: Kyphotic  Communication   Communication: No difficulties  Cognition Arousal/Alertness: Awake/alert Behavior During Therapy: WFL for tasks assessed/performed Overall Cognitive Status: Within Functional Limits for tasks assessed                                 General Comments: per pt's chart, has h/o dementia. pt oriented to place, but not time or why she was admitted ("I think I'm here because I fell.")      General Comments      Exercises     Assessment/Plan    PT Assessment Patient needs continued PT services  PT Problem List Decreased strength;Decreased activity tolerance;Decreased mobility       PT Treatment Interventions DME instruction;Gait training;Functional mobility training;Therapeutic activities;Therapeutic exercise;Balance training;Patient/family education    PT Goals (Current goals can be found in the Care Plan section)  Acute Rehab PT Goals Patient Stated Goal: to go home PT Goal Formulation: With patient Time For Goal Achievement: 09/08/16 Potential to Achieve Goals: Good    Frequency Min 3X/week   Barriers to discharge        Co-evaluation               AM-PAC PT "6 Clicks" Daily Activity   Outcome Measure Difficulty turning over in bed (including adjusting bedclothes, sheets and blankets)?: None Difficulty moving from lying on back to sitting on the side of the bed? : None Difficulty sitting down on and standing up from a chair with arms (e.g., wheelchair, bedside commode, etc,.)?: A Little Help needed moving to and from a bed to chair (including a wheelchair)?: A Little Help needed walking in hospital room?: A Little Help needed climbing 3-5 steps with a railing? : A Lot 6 Click Score: 19    End of Session Equipment Utilized During Treatment: Gait belt;Oxygen (RW) Activity Tolerance: Patient tolerated treatment well Patient left: in chair;with call bell/phone within reach;with chair alarm set;with family/visitor present Nurse Communication: Mobility status (mobility sheet left in room) PT Visit Diagnosis: Muscle weakness (generalized) (M62.81);Other abnormalities of gait and mobility (R26.89)    Time: 4580-9983 PT Time Calculation (min) (ACUTE ONLY): 45 min   Charges:   PT Evaluation $PT Eval Low Complexity: 1 Procedure PT Treatments $Therapeutic Exercise: 23-37 mins   PT G Codes:   PT G-Codes **NOT FOR INPATIENT CLASS** Functional Assessment Tool Used: AM-PAC 6 Clicks  Basic Mobility;Clinical judgement Functional Limitation: Mobility: Walking and moving around Mobility: Walking and Moving Around Current Status [phone]): At least 20 percent but less than 40 percent impaired, limited or restricted Mobility: Walking and Moving Around Goal Status [phone]): At least 1 percent but less than 20 percent impaired, limited or restricted     Geraldine Solar PT, DPT

## 2016-09-05 NOTE — Progress Notes (Signed)
PROGRESS NOTE    Tina [name]  LGX:[id number] DOB: 01-13-1934 DOA: 09/04/2016 PCP: Sharilyn Sites, MD     Brief Narrative:  83 y/o woman admitted to the hospital from home on 5/29 due to cough and dysphagia. She has a h/o scleroderma with esophageal dysphagia with h/o prior dilatations.   Assessment & Plan:   Principal Problem:   Pneumonia Active Problems:   Hypertension   COPD (chronic obstructive pulmonary disease) (HCC)   Esophageal dysphagia   Depression   Scleroderma (HCC)   Iron deficiency anemia   Hyponatremia   Nausea & vomiting   Dysphagia -Seen by ST with recommendations for nectar thick liquids. -Has a h/o scleroderma with esophageal dilatations. None in the past year. -Will request GI consultation. -Will make NPO after midnight.  Aspiration Pneumonia -Due to esophageal dysphagia. -DC levaquin and place on clindamycin.  COPD -Compensated.  Hyponatremia -Improved with IVF. -Recheck levels in am.  Essential HTN -Well controlled. -Continue home medications.  Generalized Weakness/Deconditioning -TSH/B12 -Consult PT.   DVT prophylaxis: lovenox Code Status: DNR Family Communication: daughter at bedside updated on plan of care and all questions answered. Disposition Plan: EGD in am. Anticipate DC home in 24-48 hours. Daughter requests PT evaluation.  Consultants:   GI  Procedures:   None  Antimicrobials:  Anti-infectives    Start     Dose/Rate Route Frequency Ordered Stop   09/05/16 1800  levofloxacin (LEVAQUIN) IVPB 750 mg     750 mg 100 mL/hr over 90 Minutes Intravenous Every 24 hours 09/04/16 1924 09/09/16 1759   09/05/16 0100  clindamycin (CLEOCIN) IVPB 600 mg     600 mg 100 mL/hr over 30 Minutes Intravenous Every 8 hours 09/04/16 1924     09/04/16 1615  levofloxacin (LEVAQUIN) IVPB 500 mg     500 mg 100 mL/hr over 60 Minutes Intravenous  Once 09/04/16 1605 09/04/16 1842   09/04/16 1615  clindamycin (CLEOCIN) IVPB 600 mg     600  mg 100 mL/hr over 30 Minutes Intravenous  Once 09/04/16 1605 09/04/16 1737       Subjective: C/O significant coughing with swallowing. No other issues. Very pleasant and jovial.  Objective: Vitals:   09/05/16 0529 09/05/16 0812 09/05/16 0925 09/05/16 1533  BP:    (!) 115/48  Pulse:    68  Resp:    20  Temp:    98.4 F (36.9 C)  TempSrc:    Oral  SpO2:  90% 97% 91%  Weight: 67.6 kg (149 lb)     Height:        Intake/Output Summary (Last 24 hours) at 09/05/16 1728 Last data filed at 09/05/16 0300  Gross per 24 hour  Intake           528.33 ml  Output                0 ml  Net           528.33 ml   Filed Weights   09/04/16 1442 09/04/16 2121 09/05/16 0529  Weight: 65.8 kg (145 lb) 64.8 kg (142 lb 14.1 oz) 67.6 kg (149 lb)    Examination:  General exam: Alert, awake, oriented x 3 Respiratory system: Clear to auscultation. Respiratory effort normal. Cardiovascular system:RRR. No murmurs, rubs, gallops. Gastrointestinal system: Abdomen is nondistended, soft and nontender. No organomegaly or masses felt. Normal bowel sounds heard. Central nervous system: Alert and oriented. No focal neurological deficits. Extremities: No C/C/E, +pedal pulses Skin: No rashes, lesions or  ulcers Psychiatry: Judgement and insight appear normal. Mood & affect appropriate.     Data Reviewed: I have personally reviewed following labs and imaging studies  CBC:  Recent Labs Lab 09/04/16 1558 09/05/16 0615  WBC 18.1* 13.3*  NEUTROABS 12.8* 9.5*  HGB 9.2* 9.0*  HCT 28.0* 27.8*  MCV 99.6 100.0  PLT 397 297   Basic Metabolic Panel:  Recent Labs Lab 09/04/16 1558 09/05/16 0615  NA 130* 132*  K 4.3 4.3  CL 98* 101  CO2 25 24  GLUCOSE 89 81  BUN 17 11  CREATININE 0.86 0.77  CALCIUM 11.1* 10.4*   GFR: Estimated Creatinine Clearance: 53.6 mL/min (by C-G formula based on SCr of 0.77 mg/dL). Liver Function Tests:  Recent Labs Lab 09/04/16 1558  AST 17  ALT 9*  ALKPHOS 70    BILITOT 0.6  PROT 6.3*  ALBUMIN 2.8*    Recent Labs Lab 09/04/16 1558  LIPASE 21   No results for input(s): AMMONIA in the last 168 hours. Coagulation Profile:  Recent Labs Lab 09/04/16 1558  INR 1.12   Cardiac Enzymes: No results for input(s): CKTOTAL, CKMB, CKMBINDEX, TROPONINI in the last 168 hours. BNP (last 3 results) No results for input(s): PROBNP in the last 8760 hours. HbA1C: No results for input(s): HGBA1C in the last 72 hours. CBG:  Recent Labs Lab 09/05/16 0030 09/05/16 0526 09/05/16 0732 09/05/16 1113 09/05/16 1621  GLUCAP 83 93 68 78 87   Lipid Profile: No results for input(s): CHOL, HDL, LDLCALC, TRIG, CHOLHDL, LDLDIRECT in the last 72 hours. Thyroid Function Tests: No results for input(s): TSH, T4TOTAL, FREET4, T3FREE, THYROIDAB in the last 72 hours. Anemia Panel: No results for input(s): VITAMINB12, FOLATE, FERRITIN, TIBC, IRON, RETICCTPCT in the last 72 hours. Urine analysis:    Component Value Date/Time   COLORURINE YELLOW 09/04/2016 1523   APPEARANCEUR CLOUDY (A) 09/04/2016 1523   LABSPEC 1.014 09/04/2016 1523   PHURINE 5.0 09/04/2016 1523   GLUCOSEU NEGATIVE 09/04/2016 1523   HGBUR NEGATIVE 09/04/2016 1523   BILIRUBINUR NEGATIVE 09/04/2016 1523   KETONESUR NEGATIVE 09/04/2016 1523   PROTEINUR NEGATIVE 09/04/2016 1523   UROBILINOGEN 0.2 04/25/2014 2025   NITRITE NEGATIVE 09/04/2016 1523   LEUKOCYTESUR TRACE (A) 09/04/2016 1523   Sepsis Labs: @LABRCNTIP (procalcitonin:4,lacticidven:4)  ) Recent Results (from the past 240 hour(s))  Culture, blood (Routine X 2) w Reflex to ID Panel     Status: None (Preliminary result)   Collection Time: 09/04/16  3:59 PM  Result Value Ref Range Status   Specimen Description BLOOD RIGHT ARM  Final   Special Requests   Final    BOTTLES DRAWN AEROBIC AND ANAEROBIC Blood Culture results may not be optimal due to an inadequate volume of blood received in culture bottles   Culture NO GROWTH < 24 HOURS   Final   Report Status PENDING  Incomplete  Culture, blood (Routine X 2) w Reflex to ID Panel     Status: None (Preliminary result)   Collection Time: 09/04/16  4:08 PM  Result Value Ref Range Status   Specimen Description BLOOD RIGHT ARM  Final   Special Requests   Final    BOTTLES DRAWN AEROBIC AND ANAEROBIC Blood Culture results may not be optimal due to an inadequate volume of blood received in culture bottles   Culture NO GROWTH < 24 HOURS  Final   Report Status PENDING  Incomplete         Radiology Studies: Dg Chest Port 1 View  Addendum  Date: 09/04/2016   ADDENDUM REPORT: 09/04/2016 16:05 ADDENDUM: In the body of the report I erroneously attributed the CT scan which was use for comparison to today's date when in fact it was from May 29th 2017. The density at the left lung base on today's chest x-ray is more conspicuous than on the CT scan of 1 year ago and likely does reflect acute pneumonia. The pleural plaque disease is of course a chronic process. The findings were discussed by telephone with Dr. Lita Mains. Electronically Signed   By: David  Martinique M.D.   On: 09/04/2016 16:05   Result Date: 09/04/2016 CLINICAL DATA:  Two weeks of nausea EXAM: PORTABLE CHEST 1 VIEW COMPARISON:  CT scan of the chest of today's date FINDINGS: The lungs are reasonably well inflated. There is abnormal density in the left mid and lower lung consistent with known pneumonia. There is calcified pleural plaque disease in the left correction right mid hemithorax. There is surgical suture as well in the right mid lung. The heart is normal in size. The pulmonary vascularity is not engorged. There is dense calcification in the wall of the thoracic aorta. IMPRESSION: Left lower lobe pneumonia demonstrated best on today's CT scan. Followup PA and lateral chest X-ray is recommended in 3-4 weeks following trial of antibiotic therapy to ensure resolution and exclude underlying malignancy. Pleural plaque on the right  consistent with previous asbestos exposure. Thoracic aortic atherosclerosis. Electronically Signed: By: David  Martinique M.D. On: 09/04/2016 15:53        Scheduled Meds: . allopurinol  100 mg Oral BID  . arformoterol  15 mcg Nebulization BID  . cholecalciferol  400 Units Oral Daily  . citalopram  20 mg Oral QHS  . diltiazem  180 mg Oral BID  . donepezil  10 mg Oral QHS  . enoxaparin (LOVENOX) injection  40 mg Subcutaneous Q24H  . ferrous sulfate  325 mg Oral q morning - 10a  . lisinopril  5 mg Oral Daily  . metoprolol tartrate  12.5 mg Oral BID  . mirtazapine  7.5 mg Oral QHS  . pantoprazole  40 mg Oral BID  . sodium chloride flush  3 mL Intravenous Q12H  . tiotropium  18 mcg Inhalation Daily  . umeclidinium bromide  1 puff Inhalation Daily  . vitamin B-12  250 mcg Oral Daily   Continuous Infusions: . clindamycin (CLEOCIN) IV 600 mg (09/05/16 1647)  . levofloxacin (LEVAQUIN) IV       LOS: 1 day    Time spent: 35 minutes. Greater than 50% of this time was spent in direct contact with the patient coordinating care.     Lelon Frohlich, MD [facility] Pager [phone]  If 7PM-7AM, please contact night-coverage [email or web address] Password [email or web address] 09/05/2016, 5:28 PM

## 2016-09-05 NOTE — Progress Notes (Signed)
Initial Nutrition Assessment   INTERVENTION:  Magic cup TID with meals, each supplement provides 290 kcal and 9 grams of protein    NUTRITION DIAGNOSIS:   Inadequate oral intake related to N/V and  (swallow problem) as evidenced by reported 2 weeks poor oral intake and esophageal dysphagia.   GOAL:   Patient will meet greater than or equal to 90% of their needs   MONITOR:   Diet advancement, PO intake, Labs, Weight trends  REASON FOR ASSESSMENT:   Malnutrition Screening Tool    ASSESSMENT: Ms Rosenkranz is an 83 yo female with hx of dementia, Chronic diarrhea, COPD, Gastritis, Gout, IBS and Barrett's esophagus. She presents with pneumonia and acute nausea vomiting and swallow difficulty for the past 2 weeks. Her daughters are here with her and help with her care though the pt does live alone currently.  Her home diet is regular. She was NPO initially but ST has completed a bedside swallow eval and advanced her diet to Nectar-thick full liquids. She is taking Remeron likely for appetite. Weight hx: the last value we have is July of last year to compare. She has experienced a decrease of 11% over the past 10 months. RD unable to perform nutrition focused exam at this time. NT is here working with pt care.     Recent Labs Lab 09/04/16 1558 09/05/16 0615  NA 130* 132*  K 4.3 4.3  CL 98* 101  CO2 25 24  BUN 17 11  CREATININE 0.86 0.77  CALCIUM 11.1* 10.4*  GLUCOSE 89 81   Labs: sodium 132, elevated calcium  Meds: Iron, vitamin D, Aricept, and Remeron.   Diet Order:  Diet full liquid Room service appropriate? Yes; Fluid consistency: Nectar Thick  Skin:  Reviewed, no issues  Last BM:  5/29  Height:   Ht Readings from Last 1 Encounters:  09/04/16 5\' 7"  (1.702 m)    Weight:   Wt Readings from Last 1 Encounters:  09/05/16 149 lb (67.6 kg)    Ideal Body Weight:  61 kg  BMI:  Body mass index is 23.34 kg/m.  Estimated Nutritional Needs:   Kcal:    1500-1700  Protein:   75-82 gr  Fluid:   1.5-1.7 liters daily  EDUCATION NEEDS:   No education needs identified at this time  Colman Cater MS,RD,CSG,LDN Office: #[phone] Pager: [phone]

## 2016-09-05 NOTE — Care Management Note (Signed)
Case Management Note  Patient Details  Name: Tina [name] MRN: [id number] Date of Birth: 10-17-1933  Subjective/Objective:                  Pt admitted with pneumonia. She is from home, she has two daughter at the bedside who stay with her at night. Pt has RW, does not have home oxygen or neb machine. She has used [facility] in the past. PT has recommended HHPT, pt/family agreeable and would like [facility].   Action/Plan: Plan for return home with [facility] services. [facility] rep made aware and will obtain pt info from chart. Will need to wean from O2 or home O2 assessment. CM will cont to follow   Expected Discharge Date:      09/07/2016            Expected Discharge Plan:  [place]  In-House Referral:  NA  Discharge planning Services  CM Consult  Post Acute Care Choice:  Home Health Choice offered to:  Patient  HH Arranged:  RN, PT [facility] Agency:  [place]  Status of Service:  In process, will continue to follow  Sherald Barge, RN 09/05/2016, 3:39 PM

## 2016-09-05 NOTE — Evaluation (Signed)
Clinical/Bedside Swallow Evaluation Patient Details  Name: Tina [name] MRN: [id number] Date of Birth: [birth year]  Today's Date: 09/05/2016 Time: SLP Start Time (ACUTE ONLY): 1425 SLP Stop Time (ACUTE ONLY): 1515 SLP Time Calculation (min) (ACUTE ONLY): 50 min  Past Medical History:  Past Medical History:  Diagnosis Date  . Barrett's esophagus   . Chronic diarrhea   . COPD (chronic obstructive pulmonary disease) ([place])   . Dementia   . Essential hypertension, benign   . Gastritis   . Gout   . Helicobacter pylori (H. pylori)   . Irritable bowel syndrome   . Lower abdominal pain   . Pneumonia   . Raynaud's disease   . 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 Scleroderma ([place])   . Sleep apnea    Not on CPAP  . Stroke ([place])   . Type 2 diabetes mellitus ([place])    Past Surgical History:  Past Surgical History:  Procedure Laterality Date  . BACK SURGERY    . CHOLECYSTECTOMY    . COLONOSCOPY  12/2008  . ESOPHAGEAL DILATION N/A 11/02/2015   Procedure: ESOPHAGEAL DILATION;  Surgeon: Rogene Houston, MD;  Location: [facility];  Service: Endoscopy;  Laterality: N/A;  . ESOPHAGOGASTRODUODENOSCOPY  06/09  . ESOPHAGOGASTRODUODENOSCOPY  02/02/05   EGD ED  . ESOPHAGOGASTRODUODENOSCOPY  03/08/00   EGD ED  . ESOPHAGOGASTRODUODENOSCOPY N/A 11/02/2015   Procedure: ESOPHAGOGASTRODUODENOSCOPY (EGD);  Surgeon: Rogene Houston, MD;  Location: [facility];  Service: Endoscopy;  Laterality: N/A;  . INTRAMEDULLARY (IM) NAIL INTERTROCHANTERIC Right 11/04/2013   Procedure: INTERNAL FIXATION RIGHT HIP WITH GAMMA NAIL;  Surgeon: Carole Civil, MD;  Location: [facility];  Service: Orthopedics;  Laterality: Right;  . LUNG SURGERY  2002   Lung collapsed  . PATELLA FRACTURE SURGERY     HPI:  Tina [name] is a 83 y.o. female with medical history significant for hypertension, COPD, dementia, and scleroderma with esophageal dysphagia status post dilations, now presenting to the emergency department with 2 weeks  of dysphagia, nausea, vomiting, anorexia, cough, and shortness of breath. Patient had been in her usual state of health until approximately 2 weeks ago when she began having more trouble swallowing and reports that this was accompanied by nausea with occasional vomiting. These symptoms have persisted and may have worsened some over the past 2 weeks, and she has also developed coughing and shortness of breath in recent days. She denies fevers or chills and denies chest pain or palpitations. There has not been any diarrhea and she denies any significant abdominal pain.    Assessment / Plan / Recommendation Clinical Impression  Pt is very pleasantly confused at baseline presenting with a dry and persistent cough also at baseline prior to any PO administrations. Pt has a known hx of esophageal dysphagia and esophageal dialation. PT completed evaluation simultaneously having pt move to chair for more appropriate swallowing positioning. She demonstrated immediate coughing that was mildly wet with all thin liquids trials. She demonstrated no coughing with NTL trials via cup. Family reported premature feeling of fullness and difficulty with solids, pt also reports nausea. Pt consumed jello with no overt s/sx of aspiration. Solids were reportedly difficult to swallow despite no overt s/sx of aspiration noted. Recommend full liquid NECTAR THICK liquid diet pending MBS tomorrow 5/30 and GI f/u. This diet will consist of jell-o, creamy soups, apple sauce and only NTL (no thin water). Recommend MBS. ST to continue to follow while in acute setting. SLP communicated with  MD and family regarding recommendations and daughter who is a Marine scientist was very receptive; all parties in agreement.  SLP Visit Diagnosis: Dysphagia, oropharyngeal phase (R13.12)    Aspiration Risk  Mild aspiration risk;Moderate aspiration risk    Diet Recommendation Honey-thick liquid   Liquid Administration via: Spoon;Cup Medication Administration:  Whole meds with puree Supervision: Patient able to self feed;Staff to assist with self feeding Compensations: Minimize environmental distractions;Slow rate;Small sips/bites;Follow solids with liquid Postural Changes: Seated upright at 90 degrees;Remain upright for at least 30 minutes after po intake    Other  Recommendations Recommended Consults: Consider GI evaluation;Consider esophageal assessment Oral Care Recommendations: Oral care BID Other Recommendations: Order thickener from pharmacy   Follow up Recommendations 24 hour supervision/assistance      Frequency and Duration min 2x/week  1 week       Prognosis Prognosis for Safe Diet Advancement: Fair Barriers to Reach Goals: Severity of deficits      Swallow Study   General Date of Onset: 09/04/16 HPI: Tina [name] is a 83 y.o. female with medical history significant for hypertension, COPD, dementia, and scleroderma with esophageal dysphagia status post dilations, now presenting to the emergency department with 2 weeks of dysphagia, nausea, vomiting, anorexia, cough, and shortness of breath. Patient had been in her usual state of health until approximately 2 weeks ago when she began having more trouble swallowing and reports that this was accompanied by nausea with occasional vomiting. These symptoms have persisted and may have worsened some over the past 2 weeks, and she has also developed coughing and shortness of breath in recent days. She denies fevers or chills and denies chest pain or palpitations. There has not been any diarrhea and she denies any significant abdominal pain.  Type of Study: Bedside Swallow Evaluation Previous Swallow Assessment: none Diet Prior to this Study: NPO Temperature Spikes Noted: No Respiratory Status: Nasal cannula History of Recent Intubation: No Behavior/Cognition: Alert;Cooperative;Pleasant mood;Confused Oral Cavity Assessment: Within Functional Limits Oral Cavity - Dentition: Dentures,  top;Dentures, bottom (do not fit properly) Vision: Functional for self-feeding Self-Feeding Abilities: Able to feed self;Needs set up Patient Positioning: Upright in chair Baseline Vocal Quality: Normal Volitional Cough: Strong Volitional Swallow: Able to elicit    Oral/Motor/Sensory Function Overall Oral Motor/Sensory Function: Within functional limits   Ice Chips Ice chips: Within functional limits   Thin Liquid Thin Liquid: Impaired Presentation: Cup;Straw Pharyngeal  Phase Impairments: Suspected delayed Swallow;Wet Vocal Quality;Cough - Immediate    Nectar Thick Nectar Thick Liquid: Within functional limits   Honey Thick     Puree Puree: Within functional limits   Solid   Adrionna Delcid H. Roddie Mc, CCC-SLP Speech Language Pathologist  Solid: Within functional limits        Wende Bushy 09/05/2016,3:35 PM

## 2016-09-06 ENCOUNTER — Inpatient Hospital Stay (HOSPITAL_COMMUNITY): Payer: Medicare Other

## 2016-09-06 DIAGNOSIS — J189 Pneumonia, unspecified organism: Secondary | ICD-10-CM

## 2016-09-06 DIAGNOSIS — M349 Systemic sclerosis, unspecified: Secondary | ICD-10-CM

## 2016-09-06 DIAGNOSIS — R1314 Dysphagia, pharyngoesophageal phase: Secondary | ICD-10-CM

## 2016-09-06 DIAGNOSIS — E871 Hypo-osmolality and hyponatremia: Secondary | ICD-10-CM

## 2016-09-06 LAB — GLUCOSE, CAPILLARY
GLUCOSE-CAPILLARY: 78 mg/dL (ref 65–99)
GLUCOSE-CAPILLARY: 87 mg/dL (ref 65–99)
Glucose-Capillary: 76 mg/dL (ref 65–99)
Glucose-Capillary: 84 mg/dL (ref 65–99)
Glucose-Capillary: 86 mg/dL (ref 65–99)
Glucose-Capillary: 99 mg/dL (ref 65–99)

## 2016-09-06 LAB — VITAMIN B12: Vitamin B-12: 1573 pg/mL — ABNORMAL HIGH (ref 180–914)

## 2016-09-06 MED ORDER — SODIUM CHLORIDE 0.9 % IV SOLN
INTRAVENOUS | Status: DC
  Administered 2016-09-07: 1000 mL via INTRAVENOUS

## 2016-09-06 NOTE — Progress Notes (Signed)
Modified Barium Swallow Progress Note  Patient Details  Name: ANNAKATE SOULIER MRN: [id number] Date of Birth: 1934/04/05  Today's Date: 09/06/2016  Modified Barium Swallow completed.  Full report located under Chart Review in the Imaging Section.  Brief recommendations include the following:  Clinical Impression  Pt presents with mild/moderate oral phase dysphagia, mild pharyngeal phase, and suspected primary esophageal phase dysphagia in setting of scleroderma. Pt has extremely limited oral opening due to suspected fibrosis and she was in fact unable to place her dentures for the test. Oral phase was prolonged and pt with delayed anterior posterior transit of bolus and reduced lingual retraction resulting in mild oral residuals necessitating double swallows at times. Pharyngeal phase was timely and without penetration, aspiration, or significant pharyneal residuals across textures and consistencies. Barium tablet was transiently delayed at the UES, but did transfer through UES with additional swallows of thin barium; notable prominent cricopharyngeus with trace backflow of thins into the pyriforms. Esophageal sweep revealed delayed emptying of esophagus as evidenced by barium filled esophagus for the duration of the study. The barium tablet did pass through the LES. Recommend D3/mech soft and thin liquids with standard aspiration and strict reflux precautions. Pt is at increased risk for post prandial aspiration from possible bolus backflow from esophagus. Pt may benefit from trismus therapy if indicated to facilitate increased maximal incisional opening. Above to Dr. Laural Golden and Pt. SLP will follow while in acute setting.   Swallow Evaluation Recommendations   Recommended Consults: Consider esophageal assessment   SLP Diet Recommendations: Dysphagia 3 (Mech soft) solids;Thin liquid   Liquid Administration via: Cup;Straw   Medication Administration: Crushed with puree   Supervision: Patient able  to self feed;Intermittent supervision to cue for compensatory strategies   Compensations: Multiple dry swallows after each bite/sip   Postural Changes: Remain semi-upright after after feeds/meals (Comment);Seated upright at 90 degrees   Oral Care Recommendations: Oral care BID;Patient independent with oral care   Other Recommendations: Clarify dietary restrictions   Thank you,  Genene Churn, [place] 09/06/2016,5:59 PM

## 2016-09-06 NOTE — Progress Notes (Signed)
Pt being transported down for swallow study via transport staff.

## 2016-09-06 NOTE — Progress Notes (Signed)
Pt's blood sugar was 67. Given nectar thin drink before midnight. Will recheck blood sugar and continue to monitor patient.

## 2016-09-06 NOTE — Progress Notes (Signed)
Modified barium study reviewed while being done by Ms. Genene Churn SLP. No aspiration noted. Esophageal dysmotility noted. Will proceed with EGD with ED in a.m.

## 2016-09-06 NOTE — Consult Note (Signed)
Reason for Consult:dysphagia Referring Physician:   [name] is an 83 y.o. female.  HPI: Admitted thru the ED   with dysphagia. Apparently brought in by daughter. She tells me she has had trouble swallowing for about 5 days. Per records, trouble swallowing x 12 days. She also has had a cough.  Her appetite has decreased due to dysphagia.  Weight 11/04/2015 168. 09/04/2016 wt 145. She underwent a CT chest which revealed:   1. No pulmonary embolus. 2. Patchy and confluent left lower lobe consolidation most consistent with pneumonia. Recommend radiographic follow-up to ensure clearing with PA and lateral views in 3-4 weeks. 3. Postsurgical change in the right lung. Right lung pleural calcifications, unchanged from remote chest CT dating back to 2005. Emphysema. 4. Atherosclerosis, including coronary artery calcifications. 5. Patulous mildly dilated esophagus containing ingested material.  Patient denies fever, or abdominal pain. No diarrhea. She denies having a fever.  No hematemesis. Hx of scleroderma.  Last EGD in 2017 which revealed Normal upper third of esophagus and middle third of esophagus. Non-bleeding esophageal ulcers at distal esophagus with stigmata of bleed. Salmon colored mucosa suspicious for short segment Barrett's esophagus.  Non bleeding esophageal ulcers at GE junction. Repeat EGD in 8 weeks (not done)  Has been evaluated by Speech Pathology this week and their recommendations: MBS  Past Medical History:  Diagnosis Date  . Barrett's esophagus   . Chronic diarrhea   . COPD (chronic obstructive pulmonary disease) ([place])   . Dementia   . Essential hypertension, benign   . Gastritis   . Gout   . Helicobacter pylori (H. pylori)   . Irritable bowel syndrome   . Lower abdominal pain   . Pneumonia   . Raynaud's disease   . 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 Scleroderma ([place])   . Sleep apnea    Not on CPAP  . Stroke ([place])   . Type 2 diabetes mellitus ([place])     Past  Surgical History:  Procedure Laterality Date  . BACK SURGERY    . CHOLECYSTECTOMY    . COLONOSCOPY  12/2008  . ESOPHAGEAL DILATION N/A 11/02/2015   Procedure: ESOPHAGEAL DILATION;  Surgeon: Rogene Houston, MD;  Location: [facility];  Service: Endoscopy;  Laterality: N/A;  . ESOPHAGOGASTRODUODENOSCOPY  06/09  . ESOPHAGOGASTRODUODENOSCOPY  02/02/05   EGD ED  . ESOPHAGOGASTRODUODENOSCOPY  03/08/00   EGD ED  . ESOPHAGOGASTRODUODENOSCOPY N/A 11/02/2015   Procedure: ESOPHAGOGASTRODUODENOSCOPY (EGD);  Surgeon: Rogene Houston, MD;  Location: [facility];  Service: Endoscopy;  Laterality: N/A;  . INTRAMEDULLARY (IM) NAIL INTERTROCHANTERIC Right 11/04/2013   Procedure: INTERNAL FIXATION RIGHT HIP WITH GAMMA NAIL;  Surgeon: Carole Civil, MD;  Location: [facility];  Service: Orthopedics;  Laterality: Right;  . LUNG SURGERY  2002   Lung collapsed  . PATELLA FRACTURE SURGERY      Family History  Problem Relation Age of Onset  . Asthma Father   . Melanoma Brother     Social History:  reports that she has quit smoking. Her smoking use included Cigarettes. She has never used smokeless tobacco. She reports that she does not drink alcohol or use drugs.  Allergies:    Medications: I have reviewed the patient's current medications.  Results for orders placed or performed during the hospital encounter of 09/04/16 (from the past 48 hour(s))  Urinalysis, Routine w reflex microscopic     Status: Abnormal   Collection Time: 09/04/16  3:23 PM  Result Value Ref  Range   Color, Urine YELLOW YELLOW   APPearance CLOUDY (A) CLEAR   Specific Gravity, Urine 1.014 1.005 - 1.030   pH 5.0 5.0 - 8.0   Glucose, UA NEGATIVE NEGATIVE mg/dL   Hgb urine dipstick NEGATIVE NEGATIVE   Bilirubin Urine NEGATIVE NEGATIVE   Ketones, ur NEGATIVE NEGATIVE mg/dL   Protein, ur NEGATIVE NEGATIVE mg/dL   Nitrite NEGATIVE NEGATIVE   Leukocytes, UA TRACE (A) NEGATIVE   RBC / HPF 0-5 0 - 5 RBC/hpf   WBC, UA 0-5 0 - 5  WBC/hpf   Bacteria, UA RARE (A) NONE SEEN   Squamous Epithelial / LPF 6-30 (A) NONE SEEN   Hyaline Casts, UA PRESENT   CBC with Differential/Platelet     Status: Abnormal   Collection Time: 09/04/16  3:58 PM  Result Value Ref Range   WBC 18.1 (H) 4.0 - 10.5 K/uL   RBC 2.81 (L) 3.87 - 5.11 MIL/uL   Hemoglobin 9.2 (L) 12.0 - 15.0 g/dL   HCT 28.0 (L) 36.0 - 46.0 %   MCV 99.6 78.0 - 100.0 fL   MCH 32.7 26.0 - 34.0 pg   MCHC 32.9 30.0 - 36.0 g/dL   RDW 14.7 11.5 - 15.5 %   Platelets 397 150 - 400 K/uL   Neutrophils Relative % 71 %   Neutro Abs 12.8 (H) 1.7 - 7.7 K/uL   Lymphocytes Relative 20 %   Lymphs Abs 3.6 0.7 - 4.0 K/uL   Monocytes Relative 8 %   Monocytes Absolute 1.4 (H) 0.1 - 1.0 K/uL   Eosinophils Relative 1 %   Eosinophils Absolute 0.2 0.0 - 0.7 K/uL   Basophils Relative 0 %   Basophils Absolute 0.0 0.0 - 0.1 K/uL  Comprehensive metabolic panel     Status: Abnormal   Collection Time: 09/04/16  3:58 PM  Result Value Ref Range   Sodium 130 (L) 135 - 145 mmol/L   Potassium 4.3 3.5 - 5.1 mmol/L   Chloride 98 (L) 101 - 111 mmol/L   CO2 25 22 - 32 mmol/L   Glucose, Bld 89 65 - 99 mg/dL   BUN 17 6 - 20 mg/dL   Creatinine, Ser 0.86 0.44 - 1.00 mg/dL   Calcium 11.1 (H) 8.9 - 10.3 mg/dL   Total Protein 6.3 (L) 6.5 - 8.1 g/dL   Albumin 2.8 (L) 3.5 - 5.0 g/dL   AST 17 15 - 41 U/L   ALT 9 (L) 14 - 54 U/L   Alkaline Phosphatase 70 38 - 126 U/L   Total Bilirubin 0.6 0.3 - 1.2 mg/dL   GFR calc non Af Amer >60 >60 mL/min   GFR calc Af Amer >60 >60 mL/min    Comment: (NOTE) The eGFR has been calculated using the CKD EPI equation. This calculation has not been validated in all clinical situations. eGFR's persistently <60 mL/min signify possible Chronic Kidney Disease.    Anion gap 7 5 - 15  Lipase, blood     Status: None   Collection Time: 09/04/16  3:58 PM  Result Value Ref Range   Lipase 21 11 - 51 U/L  Protime-INR     Status: None   Collection Time: 09/04/16  3:58 PM   Result Value Ref Range   Prothrombin Time 14.5 11.4 - 15.2 seconds   INR 1.12   APTT     Status: None   Collection Time: 09/04/16  3:58 PM  Result Value Ref Range   aPTT 29 24 - 36 seconds  TSH     Status: None   Collection Time: 09/04/16  3:58 PM  Result Value Ref Range   TSH 2.530 0.350 - 4.500 uIU/mL    Comment: Performed by a 3rd Generation assay with a functional sensitivity of <=0.01 uIU/mL.  Culture, blood (Routine X 2) w Reflex to ID Panel     Status: None (Preliminary result)   Collection Time: 09/04/16  3:59 PM  Result Value Ref Range   Specimen Description BLOOD RIGHT ARM    Special Requests      BOTTLES DRAWN AEROBIC AND ANAEROBIC Blood Culture results may not be optimal due to an inadequate volume of blood received in culture bottles   Culture NO GROWTH 2 DAYS    Report Status PENDING   Type and screen     Status: None   Collection Time: 09/04/16  4:00 PM  Result Value Ref Range   ABO/RH(D) A POS    Antibody Screen NEG    Sample Expiration 09/07/2016   Culture, blood (Routine X 2) w Reflex to ID Panel     Status: None (Preliminary result)   Collection Time: 09/04/16  4:08 PM  Result Value Ref Range   Specimen Description BLOOD RIGHT ARM    Special Requests      BOTTLES DRAWN AEROBIC AND ANAEROBIC Blood Culture results may not be optimal due to an inadequate volume of blood received in culture bottles   Culture NO GROWTH 2 DAYS    Report Status PENDING   I-Stat CG4 Lactic Acid, ED     Status: Abnormal   Collection Time: 09/04/16  4:49 PM  Result Value Ref Range   Lactic Acid, Venous 1.99 (HH) 0.5 - 1.9 mmol/L  POC occult blood, ED     Status: None   Collection Time: 09/04/16  5:44 PM  Result Value Ref Range   Fecal Occult Bld NEGATIVE NEGATIVE  Lactic acid, plasma     Status: None   Collection Time: 09/04/16  8:27 PM  Result Value Ref Range   Lactic Acid, Venous 1.5 0.5 - 1.9 mmol/L  Strep pneumoniae urinary antigen     Status: None   Collection Time:  09/04/16 10:44 PM  Result Value Ref Range   Strep Pneumo Urinary Antigen NEGATIVE NEGATIVE    Comment:        Infection due to S. pneumoniae cannot be absolutely ruled out since the antigen present may be below the detection limit of the test.   Glucose, capillary     Status: None   Collection Time: 09/05/16 12:30 AM  Result Value Ref Range   Glucose-Capillary 83 65 - 99 mg/dL  Glucose, capillary     Status: None   Collection Time: 09/05/16  5:26 AM  Result Value Ref Range   Glucose-Capillary 93 65 - 99 mg/dL  CBC WITH DIFFERENTIAL     Status: Abnormal   Collection Time: 09/05/16  6:15 AM  Result Value Ref Range   WBC 13.3 (H) 4.0 - 10.5 K/uL   RBC 2.78 (L) 3.87 - 5.11 MIL/uL   Hemoglobin 9.0 (L) 12.0 - 15.0 g/dL   HCT 27.8 (L) 36.0 - 46.0 %   MCV 100.0 78.0 - 100.0 fL   MCH 32.4 26.0 - 34.0 pg   MCHC 32.4 30.0 - 36.0 g/dL   RDW 14.8 11.5 - 15.5 %   Platelets 379 150 - 400 K/uL   Neutrophils Relative % 72 %   Neutro Abs 9.5 (H) 1.7 - 7.7 K/uL  Lymphocytes Relative 20 %   Lymphs Abs 2.7 0.7 - 4.0 K/uL   Monocytes Relative 7 %   Monocytes Absolute 1.0 0.1 - 1.0 K/uL   Eosinophils Relative 1 %   Eosinophils Absolute 0.2 0.0 - 0.7 K/uL   Basophils Relative 0 %   Basophils Absolute 0.0 0.0 - 0.1 K/uL  Basic metabolic panel     Status: Abnormal   Collection Time: 09/05/16  6:15 AM  Result Value Ref Range   Sodium 132 (L) 135 - 145 mmol/L   Potassium 4.3 3.5 - 5.1 mmol/L   Chloride 101 101 - 111 mmol/L   CO2 24 22 - 32 mmol/L   Glucose, Bld 81 65 - 99 mg/dL   BUN 11 6 - 20 mg/dL   Creatinine, Ser 0.77 0.44 - 1.00 mg/dL   Calcium 10.4 (H) 8.9 - 10.3 mg/dL   GFR calc non Af Amer >60 >60 mL/min   GFR calc Af Amer >60 >60 mL/min    Comment: (NOTE) The eGFR has been calculated using the CKD EPI equation. This calculation has not been validated in all clinical situations. eGFR's persistently <60 mL/min signify possible Chronic Kidney Disease.    Anion gap 7 5 - 15   Glucose, capillary     Status: None   Collection Time: 09/05/16  7:32 AM  Result Value Ref Range   Glucose-Capillary 68 65 - 99 mg/dL   Comment 1 Notify RN    Comment 2 Document in Chart   Glucose, capillary     Status: None   Collection Time: 09/05/16 11:13 AM  Result Value Ref Range   Glucose-Capillary 78 65 - 99 mg/dL   Comment 1 Notify RN    Comment 2 Document in Chart   Glucose, capillary     Status: None   Collection Time: 09/05/16  4:21 PM  Result Value Ref Range   Glucose-Capillary 87 65 - 99 mg/dL   Comment 1 Notify RN    Comment 2 Document in Chart   Glucose, capillary     Status: None   Collection Time: 09/05/16  8:35 PM  Result Value Ref Range   Glucose-Capillary 72 65 - 99 mg/dL  Glucose, capillary     Status: None   Collection Time: 09/05/16 11:50 PM  Result Value Ref Range   Glucose-Capillary 67 65 - 99 mg/dL  Glucose, capillary     Status: None   Collection Time: 09/06/16 12:45 AM  Result Value Ref Range   Glucose-Capillary 76 65 - 99 mg/dL   Comment 1 Notify RN    Comment 2 Document in Chart   Glucose, capillary     Status: None   Collection Time: 09/06/16  4:04 AM  Result Value Ref Range   Glucose-Capillary 78 65 - 99 mg/dL   Comment 1 Notify RN    Comment 2 Document in Chart     Dg Chest Port 1 View  Addendum Date: 09/04/2016   ADDENDUM REPORT: 09/04/2016 16:05 ADDENDUM: In the body of the report I erroneously attributed the CT scan which was use for comparison to today's date when in fact it was from May 29th 2017. The density at the left lung base on today's chest x-ray is more conspicuous than on the CT scan of 1 year ago and likely does reflect acute pneumonia. The pleural plaque disease is of course a chronic process. The findings were discussed by telephone with Dr. Lita Mains. Electronically Signed   By: David  Martinique M.D.  On: 09/04/2016 16:05   Result Date: 09/04/2016 CLINICAL DATA:  Two weeks of nausea EXAM: PORTABLE CHEST 1 VIEW COMPARISON:   CT scan of the chest of today's date FINDINGS: The lungs are reasonably well inflated. There is abnormal density in the left mid and lower lung consistent with known pneumonia. There is calcified pleural plaque disease in the left correction right mid hemithorax. There is surgical suture as well in the right mid lung. The heart is normal in size. The pulmonary vascularity is not engorged. There is dense calcification in the wall of the thoracic aorta. IMPRESSION: Left lower lobe pneumonia demonstrated best on today's CT scan. Followup PA and lateral chest X-ray is recommended in 3-4 weeks following trial of antibiotic therapy to ensure resolution and exclude underlying malignancy. Pleural plaque on the right consistent with previous asbestos exposure. Thoracic aortic atherosclerosis. Electronically Signed: By: David  Martinique M.D. On: 09/04/2016 15:53    ROS Blood pressure (!) 132/43, pulse 85, temperature 98.4 F (36.9 C), temperature source Oral, resp. rate 18, height '5\' 7"'  (1.702 m), weight 149 lb (67.6 kg), SpO2 96 %. Physical Exam Alert and conused. Skin warm and dry. Oral mucosa is moist.   . Sclera anicteric, conjunctivae is pink. Thyroid not enlarged. No cervical lymphadenopathy. Lungs clear. Heart regular rate and rhythm.  Abdomen is soft. Bowel sounds are positive. No hepatomegaly. No abdominal masses felt. No tenderness.  No edema to lower extremities.  Fingers contracted both hands.  02 is going per nasal cannula. .  Assessment/Plan: dysphagia. Hx of same. Hx of scleroderma. I talked with Brittini. She will speak with hospitalist concerning MBS. I discussed with Dr. Laural Golden.  Possible EGD/ED in am.    Sintia Mckissic W 09/06/2016, 8:14 AM

## 2016-09-06 NOTE — Progress Notes (Signed)
Pt back from procedural area. IV fluids restarted.

## 2016-09-06 NOTE — Progress Notes (Signed)
Physical Therapy Treatment Patient Details Name: Tina [name] MRN: [id number] DOB: 02/17/1934 Today's Date: 09/06/2016    History of Present Illness Tina [name] is a 83 y.o. female with medical history significant for hypertension, COPD, dementia, and scleroderma with esophageal dysphagia status post dilations, now presenting to the emergency department with 2 weeks of dysphagia, nausea, vomiting, anorexia, cough, and shortness of breath. Patient had been in her usual state of health until approximately 2 weeks ago when she began having more trouble swallowing and reports that this was accompanied by nausea with occasional vomiting. These symptoms have persisted and may have worsened some over the past 2 weeks, and she has also developed coughing and shortness of breath in recent days. She denies fevers or chills and denies chest pain or palpitations. There has not been any diarrhea and she denies any significant abdominal pain.     PT Comments    Pt was seen for evaluation of her mobility today, noted O2 sats 91-93% pre and post treatment, able to get to BR and chair but is having some dizziness today.  Pre gait pulse 84 and post 96.  Still planning on HHPT for follow up and will have acute therapy continue to see her to progress wth gait and balance as her symptoms allow.     Follow Up Recommendations  Home health PT     Equipment Recommendations  None recommended by PT    Recommendations for Other Services       Precautions / Restrictions Precautions Precautions: Fall Precaution Comments: IV pole, O2 on 2L and lightheaded Restrictions Weight Bearing Restrictions: No    Mobility  Bed Mobility Overal bed mobility: Needs Assistance Bed Mobility: Supine to Sit     Supine to sit: Min guard;Min assist     General bed mobility comments: increased time supine > sit with HOB elevated  Transfers Overall transfer level: Needs assistance Equipment used: Rolling walker (2  wheeled) Transfers: Sit to/from [facility] Sit to Stand: Min guard;Min assist Stand pivot transfers: Min guard       General transfer comment: min assist from toilet  Ambulation/Gait Ambulation/Gait assistance: Min guard Ambulation Distance (Feet):  (10 x 2) Assistive device: Rolling walker (2 wheeled) Gait Pattern/deviations: Step-to pattern;Step-through pattern;Decreased stride length;Trunk flexed;Narrow base of support Gait velocity: reduced Gait velocity interpretation: Below normal speed for age/gender     Stairs            Wheelchair Mobility    Modified Rankin (Stroke Patients Only)       Balance Overall balance assessment: Needs assistance Sitting-balance support: Feet supported Sitting balance-Leahy Scale: Good     Standing balance support: Bilateral upper extremity supported Standing balance-Leahy Scale: Fair Standing balance comment: requires standing assist for balance with RW in bathroom                            Cognition Arousal/Alertness: Awake/alert Behavior During Therapy: WFL for tasks assessed/performed Overall Cognitive Status: Within Functional Limits for tasks assessed                                 General Comments: daughter here and very hands-on with visit, cautioned her about overstretching IV pole as dgt wanted to move it      Exercises General Exercises - Lower Extremity Long Arc Quad: Strengthening;Both;10 reps Heel Slides: Strengthening;Both;10 reps  General Comments        Pertinent Vitals/Pain Pain Assessment: No/denies pain    Home Living                      Prior Function            PT Goals (current goals can now be found in the care plan section) Acute Rehab PT Goals Patient Stated Goal: to go home    Frequency    Min 3X/week      PT Plan Current plan remains appropriate    Co-evaluation              AM-PAC PT "6 Clicks" Daily  Activity  Outcome Measure  Difficulty turning over in bed (including adjusting bedclothes, sheets and blankets)?: None Difficulty moving from lying on back to sitting on the side of the bed? : Total Difficulty sitting down on and standing up from a chair with arms (e.g., wheelchair, bedside commode, etc,.)?: Total Help needed moving to and from a bed to chair (including a wheelchair)?: A Little Help needed walking in hospital room?: A Little Help needed climbing 3-5 steps with a railing? : A Lot 6 Click Score: 14    End of Session Equipment Utilized During Treatment: Gait belt;Oxygen Activity Tolerance: Patient tolerated treatment well Patient left: in chair;with call bell/phone within reach;with chair alarm set;with family/visitor present Nurse Communication: Mobility status PT Visit Diagnosis: Muscle weakness (generalized) (M62.81);Other abnormalities of gait and mobility (R26.89)     Time: 0981-1914 PT Time Calculation (min) (ACUTE ONLY): 30 min  Charges:  $Gait Training: 8-22 mins $Therapeutic Exercise: 8-22 mins                    G Codes:  Functional Assessment Tool Used: AM-PAC 6 Clicks Basic Mobility    Ramond Dial 09/06/2016, 10:07 AM   Mee Hives, PT MS Acute Rehab Dept. Number: [place]

## 2016-09-06 NOTE — Progress Notes (Signed)
PROGRESS NOTE    Tina [name]  SWH:[id number] DOB: Jul 07, 1933 DOA: 09/04/2016 PCP: Sharilyn Sites, MD     Brief Narrative:  83 y/o woman admitted to the hospital from home on 5/29 due to cough and dysphagia. She has a h/o scleroderma with esophageal dysphagia with h/o prior dilatations.   Assessment & Plan:   Principal Problem:   Pneumonia Active Problems:   Hypertension   COPD (chronic obstructive pulmonary disease) (HCC)   Esophageal dysphagia   Depression   Scleroderma (HCC)   Iron deficiency anemia   Hyponatremia   Nausea & vomiting   Dysphagia -For MBS today with ST. -Has a h/o scleroderma with esophageal dilatations. None in the past year. -GI planning on EGD with possible dilatation in am. -Mechanical soft diet for now.  Aspiration Pneumonia -Due to esophageal dysphagia. -Continue clindamycin for 5 days total. -Remains afebrile.  COPD -Compensated.  Hyponatremia -Improved with IVF. -Recheck levels in am.  Essential HTN -Well controlled. -Continue home medications.  Generalized Weakness/Deconditioning -TSH/B12 WNL. -PT recs HHPT.   DVT prophylaxis: lovenox Code Status: DNR Family Communication: daughter at bedside updated on plan of care and all questions answered. Disposition Plan: EGD in am. Anticipate DC home in 24-48 hours.  Consultants:   GI  Procedures:   None  Antimicrobials:  Anti-infectives    Start     Dose/Rate Route Frequency Ordered Stop   09/06/16 0100  clindamycin (CLEOCIN) IVPB 900 mg  Status:  Discontinued     900 mg 100 mL/hr over 30 Minutes Intravenous Every 8 hours 09/05/16 1750 09/05/16 1755   09/06/16 0100  clindamycin (CLEOCIN) IVPB 600 mg     600 mg 100 mL/hr over 30 Minutes Intravenous Every 8 hours 09/05/16 1755     09/05/16 1800  levofloxacin (LEVAQUIN) IVPB 750 mg  Status:  Discontinued     750 mg 100 mL/hr over 90 Minutes Intravenous Every 24 hours 09/04/16 1924 09/05/16 1737   09/05/16 1800   clindamycin (CLEOCIN) IVPB 300 mg  Status:  Discontinued     300 mg 100 mL/hr over 30 Minutes Intravenous Once 09/05/16 1737 09/05/16 1755   09/05/16 0100  clindamycin (CLEOCIN) IVPB 600 mg  Status:  Discontinued     600 mg 100 mL/hr over 30 Minutes Intravenous Every 8 hours 09/04/16 1924 09/05/16 1751   09/04/16 1615  levofloxacin (LEVAQUIN) IVPB 500 mg     500 mg 100 mL/hr over 60 Minutes Intravenous  Once 09/04/16 1605 09/04/16 1842   09/04/16 1615  clindamycin (CLEOCIN) IVPB 600 mg     600 mg 100 mL/hr over 30 Minutes Intravenous  Once 09/04/16 1605 09/04/16 1737       Subjective: Still with coughing with meals. She tells me she has lost about 20 pounds. Very pleasant.  Objective: Vitals:   09/06/16 0803 09/06/16 0807 09/06/16 0809 09/06/16 1300  BP:    (!) 149/49  Pulse:    88  Resp:    18  Temp:    98.3 F (36.8 C)  TempSrc:    Oral  SpO2: 96% 96% 96% 97%  Weight:      Height:        Intake/Output Summary (Last 24 hours) at 09/06/16 1532 Last data filed at 09/06/16 0900  Gross per 24 hour  Intake           916.25 ml  Output              200 ml  Net  716.25 ml   Filed Weights   09/04/16 1442 09/04/16 2121 09/05/16 0529  Weight: 65.8 kg (145 lb) 64.8 kg (142 lb 14.1 oz) 67.6 kg (149 lb)    Examination:  General exam: Alert, awake, oriented x 3 Respiratory system: Clear to auscultation. Respiratory effort normal. Cardiovascular system:RRR. No murmurs, rubs, gallops. Gastrointestinal system: Abdomen is nondistended, soft and nontender. No organomegaly or masses felt. Normal bowel sounds heard. Central nervous system: Alert and oriented. No focal neurological deficits. Extremities: No C/C/E, +pedal pulses Skin: No rashes, lesions or ulcers Psychiatry: Judgement and insight appear normal. Mood & affect appropriate.     Data Reviewed: I have personally reviewed following labs and imaging studies  CBC:  Recent Labs Lab 09/04/16 1558  09/05/16 0615  WBC 18.1* 13.3*  NEUTROABS 12.8* 9.5*  HGB 9.2* 9.0*  HCT 28.0* 27.8*  MCV 99.6 100.0  PLT 397 790   Basic Metabolic Panel:  Recent Labs Lab 09/04/16 1558 09/05/16 0615  NA 130* 132*  K 4.3 4.3  CL 98* 101  CO2 25 24  GLUCOSE 89 81  BUN 17 11  CREATININE 0.86 0.77  CALCIUM 11.1* 10.4*   GFR: Estimated Creatinine Clearance: 53.6 mL/min (by C-G formula based on SCr of 0.77 mg/dL). Liver Function Tests:  Recent Labs Lab 09/04/16 1558  AST 17  ALT 9*  ALKPHOS 70  BILITOT 0.6  PROT 6.3*  ALBUMIN 2.8*    Recent Labs Lab 09/04/16 1558  LIPASE 21   No results for input(s): AMMONIA in the last 168 hours. Coagulation Profile:  Recent Labs Lab 09/04/16 1558  INR 1.12   Cardiac Enzymes: No results for input(s): CKTOTAL, CKMB, CKMBINDEX, TROPONINI in the last 168 hours. BNP (last 3 results) No results for input(s): PROBNP in the last 8760 hours. HbA1C: No results for input(s): HGBA1C in the last 72 hours. CBG:  Recent Labs Lab 09/06/16 0045 09/06/16 0404 09/06/16 0817 09/06/16 1121 09/06/16 1523  GLUCAP 76 78 87 84 99   Lipid Profile: No results for input(s): CHOL, HDL, LDLCALC, TRIG, CHOLHDL, LDLDIRECT in the last 72 hours. Thyroid Function Tests:  Recent Labs  09/04/16 1558  TSH 2.530   Anemia Panel:  Recent Labs  09/04/16 1558  VITAMINB12 1,573*   Urine analysis:    Component Value Date/Time   COLORURINE YELLOW 09/04/2016 1523   APPEARANCEUR CLOUDY (A) 09/04/2016 1523   LABSPEC 1.014 09/04/2016 1523   PHURINE 5.0 09/04/2016 1523   GLUCOSEU NEGATIVE 09/04/2016 1523   HGBUR NEGATIVE 09/04/2016 1523   [place] 09/04/2016 1523   KETONESUR NEGATIVE 09/04/2016 1523   PROTEINUR NEGATIVE 09/04/2016 1523   UROBILINOGEN 0.2 04/25/2014 2025   NITRITE NEGATIVE 09/04/2016 1523   LEUKOCYTESUR TRACE (A) 09/04/2016 1523   Sepsis Labs: @LABRCNTIP (procalcitonin:4,lacticidven:4)  ) Recent Results (from the past 240  hour(s))  Culture, blood (Routine X 2) w Reflex to ID Panel     Status: None (Preliminary result)   Collection Time: 09/04/16  3:59 PM  Result Value Ref Range Status   Specimen Description BLOOD RIGHT ARM  Final   Special Requests   Final    BOTTLES DRAWN AEROBIC AND ANAEROBIC Blood Culture results may not be optimal due to an inadequate volume of blood received in culture bottles   Culture NO GROWTH 2 DAYS  Final   Report Status PENDING  Incomplete  Culture, blood (Routine X 2) w Reflex to ID Panel     Status: None (Preliminary result)   Collection Time: 09/04/16  4:08 PM  Result Value Ref Range Status   Specimen Description BLOOD RIGHT ARM  Final   Special Requests   Final    BOTTLES DRAWN AEROBIC AND ANAEROBIC Blood Culture results may not be optimal due to an inadequate volume of blood received in culture bottles   Culture NO GROWTH 2 DAYS  Final   Report Status PENDING  Incomplete         Radiology Studies: Dg Chest Port 1 View  Addendum Date: 09/04/2016   ADDENDUM REPORT: 09/04/2016 16:05 ADDENDUM: In the body of the report I erroneously attributed the CT scan which was use for comparison to today's date when in fact it was from May 29th 2017. The density at the left lung base on today's chest x-ray is more conspicuous than on the CT scan of 1 year ago and likely does reflect acute pneumonia. The pleural plaque disease is of course a chronic process. The findings were discussed by telephone with Dr. Lita Mains. Electronically Signed   By: David  Martinique M.D.   On: 09/04/2016 16:05   Result Date: 09/04/2016 CLINICAL DATA:  Two weeks of nausea EXAM: PORTABLE CHEST 1 VIEW COMPARISON:  CT scan of the chest of today's date FINDINGS: The lungs are reasonably well inflated. There is abnormal density in the left mid and lower lung consistent with known pneumonia. There is calcified pleural plaque disease in the left correction right mid hemithorax. There is surgical suture as well in the  right mid lung. The heart is normal in size. The pulmonary vascularity is not engorged. There is dense calcification in the wall of the thoracic aorta. IMPRESSION: Left lower lobe pneumonia demonstrated best on today's CT scan. Followup PA and lateral chest X-ray is recommended in 3-4 weeks following trial of antibiotic therapy to ensure resolution and exclude underlying malignancy. Pleural plaque on the right consistent with previous asbestos exposure. Thoracic aortic atherosclerosis. Electronically Signed: By: David  Martinique M.D. On: 09/04/2016 15:53        Scheduled Meds: . allopurinol  100 mg Oral BID  . arformoterol  15 mcg Nebulization BID  . cholecalciferol  400 Units Oral Daily  . citalopram  20 mg Oral QHS  . diltiazem  180 mg Oral BID  . donepezil  10 mg Oral QHS  . ferrous sulfate  325 mg Oral q morning - 10a  . lisinopril  5 mg Oral Daily  . metoprolol tartrate  12.5 mg Oral BID  . mirtazapine  7.5 mg Oral QHS  . pantoprazole  40 mg Oral BID  . sodium chloride flush  3 mL Intravenous Q12H  . tiotropium  18 mcg Inhalation Daily  . umeclidinium bromide  1 puff Inhalation Daily  . vitamin B-12  250 mcg Oral Daily   Continuous Infusions: . sodium chloride 75 mL/hr at 09/06/16 1421  . sodium chloride 20 mL/hr at 09/06/16 1530  . clindamycin (CLEOCIN) IV Stopped (09/06/16 0914)     LOS: 2 days    Time spent: 25 minutes. Greater than 50% of this time was spent in direct contact with the patient coordinating care.     Lelon Frohlich, MD [facility] Pager [phone]  If 7PM-7AM, please contact night-coverage [email or web address] Password [facility] 09/06/2016, 3:32 PM

## 2016-09-07 ENCOUNTER — Encounter (HOSPITAL_COMMUNITY): Payer: Self-pay

## 2016-09-07 ENCOUNTER — Encounter (HOSPITAL_COMMUNITY)
Admission: EM | Disposition: A | Discharge status: Home Health | Payer: Self-pay | Source: Home / Self Care | Attending: Internal Medicine

## 2016-09-07 DIAGNOSIS — J181 Lobar pneumonia, unspecified organism: Secondary | ICD-10-CM

## 2016-09-07 DIAGNOSIS — K2270 Barrett's esophagus without dysplasia: Secondary | ICD-10-CM

## 2016-09-07 DIAGNOSIS — K449 Diaphragmatic hernia without obstruction or gangrene: Secondary | ICD-10-CM

## 2016-09-07 HISTORY — PX: ESOPHAGOGASTRODUODENOSCOPY: SHX5428

## 2016-09-07 HISTORY — PX: ESOPHAGEAL DILATION: SHX303

## 2016-09-07 LAB — BASIC METABOLIC PANEL
Anion gap: 7 (ref 5–15)
BUN: 6 mg/dL (ref 6–20)
CALCIUM: 10.1 mg/dL (ref 8.9–10.3)
CO2: 26 mmol/L (ref 22–32)
CREATININE: 0.68 mg/dL (ref 0.44–1.00)
Chloride: 101 mmol/L (ref 101–111)
GFR calc Af Amer: >60 mL/min (ref >60)
GFR calc non Af Amer: >60 mL/min (ref >60)
Glucose, Bld: 88 mg/dL (ref 65–99)
Potassium: 3.6 mmol/L (ref 3.5–5.1)
SODIUM: 134 mmol/L — AB (ref 135–145)

## 2016-09-07 LAB — CBC
HCT: 28.4 % — ABNORMAL LOW (ref 36.0–46.0)
Hemoglobin: 9.2 g/dL — ABNORMAL LOW (ref 12.0–15.0)
MCH: 32.5 pg (ref 26.0–34.0)
MCHC: 32.4 g/dL (ref 30.0–36.0)
MCV: 100.4 fL — ABNORMAL HIGH (ref 78.0–100.0)
PLATELETS: 374 10*3/uL (ref 150–400)
RBC: 2.83 MIL/uL — AB (ref 3.87–5.11)
RDW: 15.1 % (ref 11.5–15.5)
WBC: 13.4 10*3/uL — ABNORMAL HIGH (ref 4.0–10.5)

## 2016-09-07 LAB — GLUCOSE, CAPILLARY
GLUCOSE-CAPILLARY: 77 mg/dL (ref 65–99)
GLUCOSE-CAPILLARY: 83 mg/dL (ref 65–99)
Glucose-Capillary: 86 mg/dL (ref 65–99)
Glucose-Capillary: 90 mg/dL (ref 65–99)
Glucose-Capillary: 84 mg/dL (ref 65–99)

## 2016-09-07 SURGERY — EGD (ESOPHAGOGASTRODUODENOSCOPY)
Anesthesia: Moderate Sedation

## 2016-09-07 MED ORDER — LIDOCAINE VISCOUS 2 % MT SOLN
OROMUCOSAL | Status: AC
  Filled 2016-09-07: qty 15

## 2016-09-07 MED ORDER — MIDAZOLAM HCL 5 MG/5ML IJ SOLN
INTRAMUSCULAR | Status: DC | PRN
  Administered 2016-09-07 (×2): 1 mg via INTRAVENOUS

## 2016-09-07 MED ORDER — MIDAZOLAM HCL 5 MG/5ML IJ SOLN
INTRAMUSCULAR | Status: AC
  Filled 2016-09-07: qty 10

## 2016-09-07 MED ORDER — MEPERIDINE HCL 50 MG/ML IJ SOLN
INTRAMUSCULAR | Status: AC
  Filled 2016-09-07: qty 1

## 2016-09-07 MED ORDER — LIDOCAINE VISCOUS 2 % MT SOLN
OROMUCOSAL | Status: DC | PRN
  Administered 2016-09-07: 3 mL via OROMUCOSAL

## 2016-09-07 MED ORDER — CLINDAMYCIN HCL 300 MG PO CAPS: 300.0000 mg | ORAL_CAPSULE | Freq: Three times a day (TID) | ORAL | 0 refills | Status: AC

## 2016-09-07 MED ORDER — STERILE WATER FOR IRRIGATION IR SOLN
Status: DC | PRN
  Administered 2016-09-07: 08:00:00

## 2016-09-07 NOTE — Discharge Summary (Signed)
Physician Discharge Summary  Tina [name]:[id number] DOB: [birth year] DOA: 09/04/2016  PCP: Sharilyn Sites, MD  Admit date: 09/04/2016 Discharge date: 09/07/2016  Time spent: 45 minutes  Recommendations for Outpatient Follow-up:  -Will be discharged home today. -Advised to follow up with Dr. Laural Golden as scheduled by his office.   Discharge Diagnoses:  Principal Problem:   Pneumonia Active Problems:   Hypertension   COPD (chronic obstructive pulmonary disease) (HCC)   Esophageal dysphagia   Depression   Scleroderma (HCC)   Iron deficiency anemia   Hyponatremia   Nausea & vomiting   Discharge Condition: Stable and improved  Filed Weights   09/04/16 1442 09/04/16 2121 09/05/16 0529  Weight: 65.8 kg (145 lb) 64.8 kg (142 lb 14.1 oz) 67.6 kg (149 lb)    History of present illness:  As per Dr. Myna [name] on 5/29: Tina [name] is a 83 y.o. female with medical history significant for hypertension, COPD, dementia, and scleroderma with esophageal dysphagia status post dilations, now presenting to the emergency department with 2 weeks of dysphagia, nausea, vomiting, anorexia, cough, and shortness of breath. Patient had been in her usual state of health until approximately 2 weeks ago when she began having more trouble swallowing and reports that this was accompanied by nausea with occasional vomiting. These symptoms have persisted and may have worsened some over the past 2 weeks, and she has also developed coughing and shortness of breath in recent days. She denies fevers or chills and denies chest pain or palpitations. There has not been any diarrhea and she denies any significant abdominal pain.   ED Course: Upon arrival to the ED, patient is found to be afebrile, saturating adequately on room air, tachypneic, slightly tachycardic, and with stable blood pressure. EKG features sinus tachycardia with rate 107 and anterior Q wave. Chest x-ray is notable for a left basilar density consistent  with pneumonia. Chemistry panels notable for a sodium of 1:30 and albumin of 2.8. CBC features a leukocytosis to 18,100 and a stable normocytic anemia with hemoglobin 9.2. Lactic acid is slightly elevated to 1.99 and urinalysis is not suggestive of infection. Blood cultures were obtained, type and screen was performed, FOBT was negative, patient was treated with 1 L of normal saline, and given empiric Levaquin and clindamycin for pneumonia with suspicion for aspiration. Patient remained hemodynamically stable in the ED and appears dyspneic, but not in acute distress, and she will be admitted to the telemetry unit for ongoing evaluation and management of dysphagia with nausea, vomiting, and left basilar pneumonia suspicious for aspiration.  Hospital Course:   Dysphagia -S/p EGD with dilatation. After this has been able to eat a soft solid lunch without any issues. -Has a h/o scleroderma with esophageal dilatations. None in the past year.  Aspiration Pneumonia -Due to esophageal dysphagia. -Continue clindamycin for 5 days total. Has 3 days remaining on DC. -Remains afebrile.  COPD -Compensated.  Hyponatremia -Resolved with IVF. -Na is 134 on DC.  Essential HTN -Well controlled. -Continue home medications.  Generalized Weakness/Deconditioning -TSH/B12 WNL. -PT recs HHPT.  Procedures: EGD: Impression:               - Normal proximal esophagus and mid esophagus.                           - Esophageal mucosal changes secondary to  established short-segment Barrett's disease.                           - Z-line irregular, 38 cm from the incisors.                           - 3 cm hiatal hernia.                           - No endoscopic esophageal abnormality to explain                            patient's dysphagia. Esophagus dilated. Dilated.                           - Normal stomach.                           - Normal duodenal bulb and second portion of  the                            duodenum.                            - No specimens collected.  Consultations:  GI, Dr. Laural Golden  Discharge Instructions  Discharge Instructions    Diet - low sodium heart healthy    Complete by:  As directed    Increase activity slowly    Complete by:  As directed      Allergies as of 09/07/2016     No Reactions   Penicillins Other (See Comments)   Unknown Has patient had a PCN reaction causing immediate rash, facial/tongue/throat swelling, SOB or lightheadedness with hypotension: unknown Has patient had a PCN reaction causing severe rash involving mucus membranes or skin necrosis: Nounknown Has patient had a PCN reaction that required hospitalization Nounknown Has patient had a PCN reaction occurring within the last 10 years: Nounknown If all of the above answers are "NO", then may      Medication List    STOP taking these medications   nitroGLYCERIN 0.4 MG SL tablet Commonly known as:  NITROSTAT   omeprazole 40 MG capsule Commonly known as:  PRILOSEC     TAKE these medications   acetaminophen 500 MG tablet Commonly known as:  TYLENOL Take 500 mg by mouth 2 (two) times daily.   allopurinol 100 MG tablet Commonly known as:  ZYLOPRIM Take 100 mg by mouth 3 (three) times daily.   ALPRAZolam 0.25 MG tablet Commonly known as:  XANAX Take 1 tablet (0.25 mg total) by mouth 3 (three) times daily as needed for anxiety. What changed:  reasons to take this   BEVESPI AEROSPHERE 9-4.8 MCG/ACT Aero Generic drug:  Glycopyrrolate-Formoterol Inhale 2 puffs into the lungs 2 (two) times daily.   CARTIA XT 180 MG 24 hr capsule Generic drug:  diltiazem Take 180 mg by mouth 2 (two) times daily.   citalopram 20 MG tablet Commonly known as:  CELEXA Take 20 mg by mouth at bedtime.   clindamycin 300 MG capsule Commonly known as:  CLEOCIN Take 1 capsule (300 mg total) by mouth 3 (three) times daily.   donepezil 10 MG tablet Commonly  known as:   ARICEPT Take 10 mg by mouth at bedtime.   escitalopram 10 MG tablet Commonly known as:  LEXAPRO Take 10 mg by mouth daily.   ferrous sulfate 325 (65 FE) MG tablet Take 325 mg by mouth every morning.   hydrochlorothiazide 25 MG tablet Commonly known as:  HYDRODIURIL Take 1 tablet (25 mg total) by mouth every morning. Resume on 11/13/13.   lisinopril 5 MG tablet Commonly known as:  PRINIVIL,ZESTRIL Take 5 mg by mouth daily.   Menthol (Topical Analgesic) 154 MG Pads Apply 1 each topically daily as needed (for arthritis pain to right arm-shoulder).   metoprolol tartrate 25 MG tablet Commonly known as:  LOPRESSOR Take 25 mg by mouth 2 (two) times daily.   mirtazapine 30 MG tablet Commonly known as:  REMERON Take 30 mg by mouth at bedtime.   ondansetron 4 MG tablet Commonly known as:  ZOFRAN Take 4 mg by mouth 2 (two) times daily.   pantoprazole 40 MG tablet Commonly known as:  PROTONIX Take 1 tablet (40 mg total) by mouth 2 (two) times daily.   SPIRIVA HANDIHALER 18 MCG inhalation capsule Generic drug:  tiotropium USE 1 CAPSULE VIA HANDIHALER ONCE DAILY   tolterodine 2 MG tablet Commonly known as:  DETROL Take 2 mg by mouth 2 (two) times daily.   traMADol 50 MG tablet Commonly known as:  ULTRAM TAKE 1 TABLET BY MOUTH EVERY 6 HOURS AS NEEDED   vitamin B-12 250 MCG tablet Commonly known as:  CYANOCOBALAMIN Take 250 mcg by mouth daily.   VITAMIN D PO Take 1 tablet by mouth every morning.   vitamin E 400 UNIT capsule Take 400 Units by mouth daily.   zolpidem 10 MG tablet Commonly known as:  AMBIEN Take 10 mg by mouth at bedtime as needed for sleep.      Allergies  Allergen Reactions  . Penicillins Other (See Comments)    Unknown Has patient had a PCN reaction causing immediate rash, facial/tongue/throat swelling, SOB or lightheadedness with hypotension: Nounknown Has patient had a PCN reaction causing severe rash involving mucus membranes or skin necrosis:  Nounknown Has patient had a PCN reaction that required hospitalization Nounknown Has patient had a PCN reaction occurring within the last 10 years: Nounknown If all of the above answers are "NO", then may   Follow-up Information    Sharilyn Sites, MD. Schedule an appointment as soon as possible for a visit on 09/12/2016.   Specialty:  Family Medicine Why:  1:30pm Contact information: [address] [place] [address] [phone]            The results of significant diagnostics from this hospitalization (including imaging, microbiology, ancillary and laboratory) are listed below for reference.    Significant Diagnostic Studies: Dg Chest Port 1 View  Addendum Date: 09/04/2016   ADDENDUM REPORT: 09/04/2016 16:05 ADDENDUM: In the body of the report I erroneously attributed the CT scan which was use for comparison to today's date when in fact it was from May 29th 2017. The density at the left lung base on today's chest x-ray is more conspicuous than on the CT scan of 1 year ago and likely does reflect acute pneumonia. The pleural plaque disease is of course a chronic process. The findings were discussed by telephone with Dr. Lita Mains. Electronically Signed   By: David  Martinique M.D.   On: 09/04/2016 16:05   Result Date: 09/04/2016 CLINICAL DATA:  Two weeks of nausea EXAM: PORTABLE CHEST 1 VIEW  COMPARISON:  CT scan of the chest of today's date FINDINGS: The lungs are reasonably well inflated. There is abnormal density in the left mid and lower lung consistent with known pneumonia. There is calcified pleural plaque disease in the left correction right mid hemithorax. There is surgical suture as well in the right mid lung. The heart is normal in size. The pulmonary vascularity is not engorged. There is dense calcification in the wall of the thoracic aorta. IMPRESSION: Left lower lobe pneumonia demonstrated best on today's CT scan. Followup PA and lateral chest X-ray is recommended in 3-4  weeks following trial of antibiotic therapy to ensure resolution and exclude underlying malignancy. Pleural plaque on the right consistent with previous asbestos exposure. Thoracic aortic atherosclerosis. Electronically Signed: By: David  Martinique M.D. On: 09/04/2016 15:53   Dg Swallowing Func-speech Pathology  Result Date: 09/06/2016 Objective Swallowing Evaluation: Type of Study: MBS-Modified Barium Swallow Study Patient Details Name: ZANAYA BAIZE MRN: [id number] Date of Birth: 31-May-1933 Today's Date: 09/06/2016 Time: SLP Start Time (ACUTE ONLY): 1330-SLP Stop Time (ACUTE ONLY): 1355 SLP Time Calculation (min) (ACUTE ONLY): 25 min Past Medical History: Past Medical History: Diagnosis Date . Barrett's esophagus  . Chronic diarrhea  . COPD (chronic obstructive pulmonary disease) ([place])  . Dementia  . Essential hypertension, benign  . Gastritis  . Gout  . Helicobacter pylori (H. pylori)  . Irritable bowel syndrome  . Lower abdominal pain  . Pneumonia  . Raynaud's disease  . 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 Scleroderma ([place])  . Sleep apnea   Not on CPAP . Stroke ([place])  . Type 2 diabetes mellitus ([place])  Past Surgical History: Past Surgical History: Procedure Laterality Date . BACK SURGERY   . CHOLECYSTECTOMY   . COLONOSCOPY  12/2008 . ESOPHAGEAL DILATION N/A 11/02/2015  Procedure: ESOPHAGEAL DILATION;  Surgeon: Rogene Houston, MD;  Location: [facility];  Service: Endoscopy;  Laterality: N/A; . ESOPHAGOGASTRODUODENOSCOPY  06/09 . ESOPHAGOGASTRODUODENOSCOPY  02/02/05  EGD ED . ESOPHAGOGASTRODUODENOSCOPY  03/08/00  EGD ED . ESOPHAGOGASTRODUODENOSCOPY N/A 11/02/2015  Procedure: ESOPHAGOGASTRODUODENOSCOPY (EGD);  Surgeon: Rogene Houston, MD;  Location: [facility];  Service: Endoscopy;  Laterality: N/A; . INTRAMEDULLARY (IM) NAIL INTERTROCHANTERIC Right 11/04/2013  Procedure: INTERNAL FIXATION RIGHT HIP WITH GAMMA NAIL;  Surgeon: Carole Civil, MD;  Location: [facility];  Service: Orthopedics;  Laterality: Right; . LUNG  SURGERY  2002  Lung collapsed . PATELLA FRACTURE SURGERY   HPI: Tina [name] is a 83 y.o. female with medical history significant for hypertension, COPD, dementia, and scleroderma with esophageal dysphagia status post dilations, now presenting to the emergency department with 2 weeks of dysphagia, nausea, vomiting, anorexia, cough, and shortness of breath. Patient had been in her usual state of health until approximately 2 weeks ago when she began having more trouble swallowing and reports that this was accompanied by nausea with occasional vomiting. These symptoms have persisted and may have worsened some over the past 2 weeks, and she has also developed coughing and shortness of breath in recent days. She denies fevers or chills and denies chest pain or palpitations. There has not been any diarrhea and she denies any significant abdominal pain.  Subjective: "I can't get my dentures in." Assessment / Plan / Recommendation CHL IP CLINICAL IMPRESSIONS 09/06/2016 Clinical Impression Pt presents with mild/moderate oral phase dysphagia, mild pharyngeal phase, and suspected primary esophageal phase dysphagia in setting of scleroderma. Pt has extremely limited oral opening due to suspected fibrosis and she was in  fact unable to place her dentures for the test. Oral phase was prolonged and pt with delayed anterior posterior transit of bolus and reduced lingual retraction resulting in mild oral residuals necessitating double swallows at times. Pharyngeal phase was timely and without penetration, aspiration, or significant pharyneal residuals across textures and consistencies. Barium tablet was transiently delayed at the UES, but did transfer through UES with additional swallows of thin barium; notable prominent cricopharyngeus with trace backflow of thins into the pyriforms. Esophageal sweep revealed delayed emptying of esophagus as evidenced by barium filled esophagus for the duration of the study. The barium tablet did  pass through the LES. Recommend D3/mech soft and thin liquids with standard aspiration and strict reflux precautions. Pt is at increased risk for post prandial aspiration from possible bolus backflow from esophagus. Pt may benefit from trismus therapy if indicated to facilitate increased maximal incisional opening. Above to Dr. Laural Golden and Pt. SLP will follow while in acute setting. SLP Visit Diagnosis Dysphagia, oropharyngeal phase (R13.12);Dysphagia, pharyngoesophageal phase (R13.14) Attention and concentration deficit following -- Frontal lobe and executive function deficit following -- Impact on safety and function Mild aspiration risk   CHL IP TREATMENT RECOMMENDATION 09/06/2016 Treatment Recommendations Therapy as outlined in treatment plan below   Prognosis 09/06/2016 Prognosis for Safe Diet Advancement Good Barriers to Reach Goals Severity of deficits Barriers/Prognosis Comment -- CHL IP DIET RECOMMENDATION 09/06/2016 SLP Diet Recommendations Dysphagia 3 (Mech soft) solids;Thin liquid Liquid Administration via Cup;Straw Medication Administration Crushed with puree Compensations Multiple dry swallows after each bite/sip Postural Changes Remain semi-upright after after feeds/meals (Comment);Seated upright at 90 degrees   CHL IP OTHER RECOMMENDATIONS 09/06/2016 Recommended Consults Consider esophageal assessment Oral Care Recommendations Oral care BID;Patient independent with oral care Other Recommendations Clarify dietary restrictions   CHL IP FOLLOW UP RECOMMENDATIONS 09/06/2016 Follow up Recommendations None   CHL IP FREQUENCY AND DURATION 09/06/2016 Speech Therapy Frequency (ACUTE ONLY) min 2x/week Treatment Duration 1 week      CHL IP ORAL PHASE 09/06/2016 Oral Phase Impaired Oral - Pudding Teaspoon -- Oral - Pudding Cup -- Oral - Honey Teaspoon -- Oral - Honey Cup -- Oral - Nectar Teaspoon -- Oral - Nectar Cup -- Oral - Nectar Straw -- Oral - Thin Teaspoon -- Oral - Thin Cup Weak lingual manipulation;Incomplete  tongue to palate contact;Lingual/palatal residue Oral - Thin Straw -- Oral - Puree -- Oral - Mech Soft Impaired mastication;Weak lingual manipulation;Lingual/palatal residue;Delayed oral transit;Decreased bolus cohesion Oral - Regular -- Oral - Multi-Consistency -- Oral - Pill Delayed oral transit;Reduced posterior propulsion Oral Phase - Comment lingual residuals trickle down to valleculae  CHL IP PHARYNGEAL PHASE 09/06/2016 Pharyngeal Phase Impaired Pharyngeal- Pudding Teaspoon -- Pharyngeal -- Pharyngeal- Pudding Cup -- Pharyngeal -- Pharyngeal- Honey Teaspoon -- Pharyngeal -- Pharyngeal- Honey Cup -- Pharyngeal -- Pharyngeal- Nectar Teaspoon -- Pharyngeal -- Pharyngeal- Nectar Cup -- Pharyngeal -- Pharyngeal- Nectar Straw -- Pharyngeal -- Pharyngeal- Thin Teaspoon -- Pharyngeal -- Pharyngeal- Thin Cup Pharyngeal residue - valleculae;Pharyngeal residue - cp segment;Reduced tongue base retraction Pharyngeal -- Pharyngeal- Thin Straw -- Pharyngeal -- Pharyngeal- Puree -- Pharyngeal -- Pharyngeal- Mechanical Soft -- Pharyngeal -- Pharyngeal- Regular -- Pharyngeal -- Pharyngeal- Multi-consistency -- Pharyngeal -- Pharyngeal- Pill -- Pharyngeal -- Pharyngeal Comment --  CHL IP CERVICAL ESOPHAGEAL PHASE 09/06/2016 Cervical Esophageal Phase Impaired Pudding Teaspoon -- Pudding Cup -- Honey Teaspoon -- Honey Cup -- Nectar Teaspoon -- Nectar Cup -- Nectar Straw -- Thin Teaspoon -- Thin Cup -- Thin Straw -- Puree Prominent cricopharyngeal segment  Mechanical Soft -- Regular -- Multi-consistency -- Pill -- Cervical Esophageal Comment reduced emptying noted in esophagus Thank you, Tina [name], [place] No flowsheet data found. [name],Tina 09/06/2016, 5:59 PM               Microbiology: Recent Results (from the past 240 hour(s))  Culture, blood (Routine X 2) w Reflex to ID Panel     Status: None (Preliminary result)   Collection Time: 09/04/16  3:59 PM  Result Value Ref Range Status   Specimen Description  BLOOD RIGHT ARM  Final   Special Requests   Final    BOTTLES DRAWN AEROBIC AND ANAEROBIC Blood Culture results may not be optimal due to an inadequate volume of blood received in culture bottles   Culture NO GROWTH 3 DAYS  Final   Report Status PENDING  Incomplete  Culture, blood (Routine X 2) w Reflex to ID Panel     Status: None (Preliminary result)   Collection Time: 09/04/16  4:08 PM  Result Value Ref Range Status   Specimen Description BLOOD RIGHT ARM  Final   Special Requests   Final    BOTTLES DRAWN AEROBIC AND ANAEROBIC Blood Culture results may not be optimal due to an inadequate volume of blood received in culture bottles   Culture NO GROWTH 3 DAYS  Final   Report Status PENDING  Incomplete     Labs: Basic Metabolic Panel:  Recent Labs Lab 09/04/16 1558 09/05/16 0615 09/07/16 0621  NA 130* 132* 134*  K 4.3 4.3 3.6  CL 98* 101 101  CO2 25 24 26   GLUCOSE 89 81 88  BUN 17 11 6   CREATININE 0.86 0.77 0.68  CALCIUM 11.1* 10.4* 10.1   Liver Function Tests:  Recent Labs Lab 09/04/16 1558  AST 17  ALT 9*  ALKPHOS 70  BILITOT 0.6  PROT 6.3*  ALBUMIN 2.8*    Recent Labs Lab 09/04/16 1558  LIPASE 21   No results for input(s): AMMONIA in the last 168 hours. CBC:  Recent Labs Lab 09/04/16 1558 09/05/16 0615 09/07/16 0621  WBC 18.1* 13.3* 13.4*  NEUTROABS 12.8* 9.5*  --   HGB 9.2* 9.0* 9.2*  HCT 28.0* 27.8* 28.4*  MCV 99.6 100.0 100.4*  PLT 397 379 374   Cardiac Enzymes: No results for input(s): CKTOTAL, CKMB, CKMBINDEX, TROPONINI in the last 168 hours. BNP: BNP (last 3 results) No results for input(s): BNP in the last 8760 hours.  ProBNP (last 3 results) No results for input(s): PROBNP in the last 8760 hours.  CBG:  Recent Labs Lab 09/06/16 2050 09/07/16 0042 09/07/16 0501 09/07/16 0835 09/07/16 1110  GLUCAP 86 77 86 90 84       Signed:  [facility] Pager: [phone] 09/07/2016, 2:03 PM

## 2016-09-07 NOTE — Op Note (Signed)
[facility] Patient Name: Tina [name] Procedure Date: 09/07/2016 7:19 AM MRN: [id number] Date of Birth: [birth year] Attending MD: Hildred Laser , MD CSN: [id number] Age: 83 Admit Type: Inpatient Procedure:                Upper GI endoscopy Indications:              Esophageal dysphagia Providers:                Hildred Laser, MD, Janeece Riggers, RN, Jeanann Lewandowsky. Sharon Seller, RN Referring MD:             Rande Lawman, MD Medicines:                Lidocaine spray, Midazolam 2 mg IV Complications:            No immediate complications. Estimated Blood Loss:     Estimated blood loss: none. Procedure:                Pre-Anesthesia Assessment:                           - Prior to the procedure, a History and Physical                            was performed, and patient medications and                            allergies were reviewed. The patient's tolerance of                            previous anesthesia was also reviewed. The risks                            and benefits of the procedure and the sedation                            options and risks were discussed with the patient.                            All questions were answered, and informed consent                            was obtained. Prior Anticoagulants: The patient has                            taken no previous anticoagulant or antiplatelet                            agents. ASA Grade Assessment: III - A patient with                            severe systemic disease. After reviewing the risks  and benefits, the patient was deemed in                            satisfactory condition to undergo the procedure.                           After obtaining informed consent, the endoscope was                            passed under direct vision. Throughout the                            procedure, the patient's blood pressure, pulse, and                            oxygen  saturations were monitored continuously. The                            EG-[id number] ([id number]) scope was introduced through the                            mouth, and advanced to the second part of duodenum.                            The upper GI endoscopy was accomplished without                            difficulty. The patient tolerated the procedure                            well. Scope In: 7:51:20 AM Scope Out: 8:03:51 AM Total Procedure Duration: 0 hours 12 minutes 31 seconds  Findings:      The proximal esophagus and mid esophagus were normal.      There were esophageal mucosal changes secondary to established       short-segment Barrett's disease present in the distal esophagus. The       maximum longitudinal extent of these mucosal changes was 1 cm in length.      The Z-line was irregular and was found 38 cm from the incisors.      A 3 cm hiatal hernia was present.      No endoscopic abnormality was evident in the esophagus to explain the       patient's complaint of dysphagia. It was decided, however, to proceed       with dilation of the entire esophagus. The scope was withdrawn. Dilation       was performed with a Maloney dilator with no resistance at 50 Fr. The       dilation site was examined following endoscope reinsertion and showed no       change and no bleeding, mucosal tear or perforation.      The entire examined stomach was normal.      The duodenal bulb and second portion of the duodenum were normal. Impression:               - Normal proximal esophagus and mid esophagus.                           -  Esophageal mucosal changes secondary to                            established short-segment Barrett's disease.                           - Z-line irregular, 38 cm from the incisors.                           - 3 cm hiatal hernia.                           - No endoscopic esophageal abnormality to explain                            patient's dysphagia. Esophagus dilated.  Dilated.                           - Normal stomach.                           - Normal duodenal bulb and second portion of the                            duodenum.                           - No specimens collected. Moderate Sedation:      Moderate (conscious) sedation was administered by the endoscopy nurse       and supervised by the endoscopist. The following parameters were       monitored: oxygen saturation, heart rate, blood pressure, CO2       capnography and response to care. Total physician intraservice time was       16 minutes. Recommendation:           - Return patient to hospital ward for ongoing care.                           - Mechanical soft diet today.                           - Continue present medications. Procedure Code(s):        --- Professional ---                           [phone], Esophagogastroduodenoscopy, flexible,                            transoral; diagnostic, including collection of                            specimen(s) by brushing or washing, when performed                            (separate procedure)  92119, Dilation of esophagus, by unguided sound or                            bougie, single or multiple passes                           99152, Moderate sedation services provided by the                            same physician or other qualified health care                            professional performing the diagnostic or                            therapeutic service that the sedation supports,                            requiring the presence of an independent trained                            observer to assist in the monitoring of the                            patient's level of consciousness and physiological                            status; initial 15 minutes of intraservice time,                            patient age 35 years or older Diagnosis Code(s):        --- Professional ---                            K22.70, Barrett's esophagus without dysplasia                           K22.8, Other specified diseases of esophagus                           K44.9, Diaphragmatic hernia without obstruction or                            gangrene                           R13.14, Dysphagia, pharyngoesophageal phase CPT copyright 2016 [employer]. All rights reserved. The codes documented in this report are preliminary and upon coder review may  be revised to meet current compliance requirements. Hildred Laser, MD Hildred Laser, MD 09/07/2016 8:13:13 AM This report has been signed electronically. Number of Addenda: 0

## 2016-09-07 NOTE — Care Management Note (Signed)
Case Management Note  Patient Details  Name: Tina [name] MRN: [id number] Date of Birth: 11-01-1933     Expected Discharge Date:  09/07/16               Expected Discharge Plan:  [place]  In-House Referral:  NA  Discharge planning Services  CM Consult  Post Acute Care Choice:  Home Health Choice offered to:  Patient  DME Arranged:    DME Agency:     HH Arranged:  RN, PT [place] Agency:  [place]  Status of Service:  In process, will continue to follow  If discussed at Long Length of Stay Meetings, dates discussed:    Additional Comments: Patient discharging home today. She will need oxygen. CM spoke with Son in [employer], Mallie Mussel, as patient stated daughter is working. Oxygen will be delivered by Romualdo Bolk of [facility]. CM asked Mallie Mussel if he or daughter can  be present for instruction on oxygen equipment. He states he will call [name], daughter to come to hospital.   Marland Reine, Chauncey Reading, RN 09/07/2016, 3:36 PM

## 2016-09-07 NOTE — Progress Notes (Signed)
Pt back on the floor. No complaints of pain, vitals stable. Currently still drowsy and resting. Will continue to monitor.

## 2016-09-07 NOTE — Care Management (Addendum)
    Durable Medical Equipment        Start     Ordered   09/07/16 1548  For home use only DME oxygen  Once    Question Answer Comment  Mode or (Route) Nasal cannula   Liters per Minute 2   Frequency Continuous (stationary and portable oxygen unit needed)   Oxygen conserving device Yes   Oxygen delivery system Gas      09/07/16 1547     Alternate treatments tried and found insufficient such as inhaler.

## 2016-09-07 NOTE — Progress Notes (Signed)
IV removed, WNL. D/C instructions given to pt and family. Verbalized understanding. Daughter at bedside to transport home.

## 2016-09-07 NOTE — Progress Notes (Signed)
SATURATION QUALIFICATIONS: (This note is used to comply with regulatory documentation for home oxygen)  Patient Saturations on Room Air at Rest = 81%   Patient Saturations on 2 Liters of oxygen while at rest: 95%  Please briefly explain why patient needs home oxygen:  Desat's even at rest without ambulating.

## 2016-09-07 NOTE — Progress Notes (Signed)
Patient arrived from floor via wheelchair. O2 Sat 78% on room air. Was not wearing oxygen when picked up from room. O2 applied at 3 liters by [place]. Patients O2 sat up to 90%. BP 82/56 and 85/71. Dr. Laural Golden notified.

## 2016-09-07 NOTE — Progress Notes (Signed)
Pt transported down to procedural area via transport staff.

## 2016-09-09 LAB — CULTURE, BLOOD (ROUTINE X 2)
CULTURE: NO GROWTH
Culture: NO GROWTH

## 2016-09-10 DIAGNOSIS — J690 Pneumonitis due to inhalation of food and vomit: Secondary | ICD-10-CM | POA: Diagnosis not present

## 2016-09-10 DIAGNOSIS — M109 Gout, unspecified: Secondary | ICD-10-CM | POA: Diagnosis not present

## 2016-09-10 DIAGNOSIS — J449 Chronic obstructive pulmonary disease, unspecified: Secondary | ICD-10-CM | POA: Diagnosis not present

## 2016-09-10 DIAGNOSIS — G4730 Sleep apnea, unspecified: Secondary | ICD-10-CM | POA: Diagnosis not present

## 2016-09-10 DIAGNOSIS — I7300 Raynaud's syndrome without gangrene: Secondary | ICD-10-CM | POA: Diagnosis not present

## 2016-09-10 DIAGNOSIS — Z7982 Long term (current) use of aspirin: Secondary | ICD-10-CM | POA: Diagnosis not present

## 2016-09-10 DIAGNOSIS — F329 Major depressive disorder, single episode, unspecified: Secondary | ICD-10-CM | POA: Diagnosis not present

## 2016-09-10 DIAGNOSIS — R1314 Dysphagia, pharyngoesophageal phase: Secondary | ICD-10-CM | POA: Diagnosis not present

## 2016-09-10 DIAGNOSIS — Z87891 Personal history of nicotine dependence: Secondary | ICD-10-CM | POA: Diagnosis not present

## 2016-09-10 DIAGNOSIS — I10 Essential (primary) hypertension: Secondary | ICD-10-CM | POA: Diagnosis not present

## 2016-09-10 DIAGNOSIS — D509 Iron deficiency anemia, unspecified: Secondary | ICD-10-CM | POA: Diagnosis not present

## 2016-09-10 DIAGNOSIS — F0390 Unspecified dementia without behavioral disturbance: Secondary | ICD-10-CM | POA: Diagnosis not present

## 2016-09-10 DIAGNOSIS — M349 Systemic sclerosis, unspecified: Secondary | ICD-10-CM | POA: Diagnosis not present

## 2016-09-10 DIAGNOSIS — Z9981 Dependence on supplemental oxygen: Secondary | ICD-10-CM | POA: Diagnosis not present

## 2016-09-11 ENCOUNTER — Telehealth (INDEPENDENT_AMBULATORY_CARE_PROVIDER_SITE_OTHER): Payer: Self-pay | Admitting: Internal Medicine

## 2016-09-11 DIAGNOSIS — J449 Chronic obstructive pulmonary disease, unspecified: Secondary | ICD-10-CM | POA: Diagnosis not present

## 2016-09-11 DIAGNOSIS — F0390 Unspecified dementia without behavioral disturbance: Secondary | ICD-10-CM | POA: Diagnosis not present

## 2016-09-11 DIAGNOSIS — R1314 Dysphagia, pharyngoesophageal phase: Secondary | ICD-10-CM | POA: Diagnosis not present

## 2016-09-11 DIAGNOSIS — M349 Systemic sclerosis, unspecified: Secondary | ICD-10-CM | POA: Diagnosis not present

## 2016-09-11 DIAGNOSIS — D509 Iron deficiency anemia, unspecified: Secondary | ICD-10-CM | POA: Diagnosis not present

## 2016-09-11 DIAGNOSIS — J690 Pneumonitis due to inhalation of food and vomit: Secondary | ICD-10-CM | POA: Diagnosis not present

## 2016-09-11 NOTE — Telephone Encounter (Signed)
Patient's daughter Arrie Aran called and stated that Dr. Laural Golden did an endoscopy on her Friday.  Stated that she is still getting sick on her stomach when she eats just a little bit.  She wants to know if he has any advice for that or if something else needs to be done.  [phone]

## 2016-09-12 DIAGNOSIS — J449 Chronic obstructive pulmonary disease, unspecified: Secondary | ICD-10-CM | POA: Diagnosis not present

## 2016-09-12 DIAGNOSIS — Z6822 Body mass index (BMI) 22.0-22.9, adult: Secondary | ICD-10-CM | POA: Diagnosis not present

## 2016-09-12 DIAGNOSIS — M349 Systemic sclerosis, unspecified: Secondary | ICD-10-CM | POA: Diagnosis not present

## 2016-09-12 DIAGNOSIS — D509 Iron deficiency anemia, unspecified: Secondary | ICD-10-CM | POA: Diagnosis not present

## 2016-09-12 DIAGNOSIS — L943 Sclerodactyly: Secondary | ICD-10-CM | POA: Diagnosis not present

## 2016-09-12 DIAGNOSIS — J189 Pneumonia, unspecified organism: Secondary | ICD-10-CM | POA: Diagnosis not present

## 2016-09-13 ENCOUNTER — Encounter (HOSPITAL_COMMUNITY): Payer: Self-pay | Admitting: Internal Medicine

## 2016-09-13 DIAGNOSIS — J690 Pneumonitis due to inhalation of food and vomit: Secondary | ICD-10-CM | POA: Diagnosis not present

## 2016-09-13 DIAGNOSIS — J449 Chronic obstructive pulmonary disease, unspecified: Secondary | ICD-10-CM | POA: Diagnosis not present

## 2016-09-13 DIAGNOSIS — F0390 Unspecified dementia without behavioral disturbance: Secondary | ICD-10-CM | POA: Diagnosis not present

## 2016-09-13 DIAGNOSIS — R1314 Dysphagia, pharyngoesophageal phase: Secondary | ICD-10-CM | POA: Diagnosis not present

## 2016-09-13 DIAGNOSIS — M349 Systemic sclerosis, unspecified: Secondary | ICD-10-CM | POA: Diagnosis not present

## 2016-09-13 DIAGNOSIS — D509 Iron deficiency anemia, unspecified: Secondary | ICD-10-CM | POA: Diagnosis not present

## 2016-09-14 DIAGNOSIS — F0390 Unspecified dementia without behavioral disturbance: Secondary | ICD-10-CM | POA: Diagnosis not present

## 2016-09-14 DIAGNOSIS — D509 Iron deficiency anemia, unspecified: Secondary | ICD-10-CM | POA: Diagnosis not present

## 2016-09-14 DIAGNOSIS — J449 Chronic obstructive pulmonary disease, unspecified: Secondary | ICD-10-CM | POA: Diagnosis not present

## 2016-09-14 DIAGNOSIS — M349 Systemic sclerosis, unspecified: Secondary | ICD-10-CM | POA: Diagnosis not present

## 2016-09-14 DIAGNOSIS — R1314 Dysphagia, pharyngoesophageal phase: Secondary | ICD-10-CM | POA: Diagnosis not present

## 2016-09-14 DIAGNOSIS — J690 Pneumonitis due to inhalation of food and vomit: Secondary | ICD-10-CM | POA: Diagnosis not present

## 2016-09-14 NOTE — Telephone Encounter (Signed)
Patient has called Dr.Fusco. Dr.Fusco called Karna Christmas , he is going to put her on a little Reglan.

## 2016-09-17 ENCOUNTER — Telehealth (INDEPENDENT_AMBULATORY_CARE_PROVIDER_SITE_OTHER): Payer: Self-pay | Admitting: Internal Medicine

## 2016-09-17 ENCOUNTER — Emergency Department (HOSPITAL_COMMUNITY): Payer: Medicare Other

## 2016-09-17 ENCOUNTER — Encounter (HOSPITAL_COMMUNITY): Payer: Self-pay | Admitting: *Deleted

## 2016-09-17 ENCOUNTER — Inpatient Hospital Stay (HOSPITAL_COMMUNITY)
Admission: EM | Admit: 2016-09-17 | Discharge: 2016-09-19 | DRG: 180 | Disposition: A | Discharge status: Hospice | Payer: Medicare Other | Attending: Internal Medicine | Admitting: Internal Medicine

## 2016-09-17 DIAGNOSIS — K5730 Diverticulosis of large intestine without perforation or abscess without bleeding: Secondary | ICD-10-CM | POA: Diagnosis not present

## 2016-09-17 DIAGNOSIS — Z87891 Personal history of nicotine dependence: Secondary | ICD-10-CM | POA: Diagnosis not present

## 2016-09-17 DIAGNOSIS — Z7189 Other specified counseling: Secondary | ICD-10-CM

## 2016-09-17 DIAGNOSIS — R918 Other nonspecific abnormal finding of lung field: Secondary | ICD-10-CM | POA: Diagnosis present

## 2016-09-17 DIAGNOSIS — Z515 Encounter for palliative care: Secondary | ICD-10-CM | POA: Diagnosis not present

## 2016-09-17 DIAGNOSIS — R630 Anorexia: Secondary | ICD-10-CM | POA: Diagnosis present

## 2016-09-17 DIAGNOSIS — E785 Hyperlipidemia, unspecified: Secondary | ICD-10-CM | POA: Diagnosis present

## 2016-09-17 DIAGNOSIS — F509 Eating disorder, unspecified: Secondary | ICD-10-CM | POA: Diagnosis not present

## 2016-09-17 DIAGNOSIS — E119 Type 2 diabetes mellitus without complications: Secondary | ICD-10-CM

## 2016-09-17 DIAGNOSIS — J439 Emphysema, unspecified: Secondary | ICD-10-CM | POA: Diagnosis present

## 2016-09-17 DIAGNOSIS — R000 Tachycardia, unspecified: Secondary | ICD-10-CM | POA: Diagnosis present

## 2016-09-17 DIAGNOSIS — Z880 Allergy status to penicillin: Secondary | ICD-10-CM

## 2016-09-17 DIAGNOSIS — Z66 Do not resuscitate: Secondary | ICD-10-CM | POA: Diagnosis present

## 2016-09-17 DIAGNOSIS — Y95 Nosocomial condition: Secondary | ICD-10-CM | POA: Diagnosis present

## 2016-09-17 DIAGNOSIS — C7802 Secondary malignant neoplasm of left lung: Principal | ICD-10-CM | POA: Diagnosis present

## 2016-09-17 DIAGNOSIS — R109 Unspecified abdominal pain: Secondary | ICD-10-CM

## 2016-09-17 DIAGNOSIS — D649 Anemia, unspecified: Secondary | ICD-10-CM | POA: Diagnosis present

## 2016-09-17 DIAGNOSIS — J90 Pleural effusion, not elsewhere classified: Secondary | ICD-10-CM | POA: Diagnosis not present

## 2016-09-17 DIAGNOSIS — I10 Essential (primary) hypertension: Secondary | ICD-10-CM | POA: Diagnosis present

## 2016-09-17 DIAGNOSIS — F0390 Unspecified dementia without behavioral disturbance: Secondary | ICD-10-CM | POA: Diagnosis present

## 2016-09-17 DIAGNOSIS — G4730 Sleep apnea, unspecified: Secondary | ICD-10-CM | POA: Diagnosis present

## 2016-09-17 DIAGNOSIS — C799 Secondary malignant neoplasm of unspecified site: Secondary | ICD-10-CM

## 2016-09-17 DIAGNOSIS — F32A Depression, unspecified: Secondary | ICD-10-CM | POA: Diagnosis present

## 2016-09-17 DIAGNOSIS — E7800 Pure hypercholesterolemia, unspecified: Secondary | ICD-10-CM | POA: Diagnosis present

## 2016-09-17 DIAGNOSIS — C786 Secondary malignant neoplasm of retroperitoneum and peritoneum: Secondary | ICD-10-CM | POA: Diagnosis present

## 2016-09-17 DIAGNOSIS — R6251 Failure to thrive (child): Secondary | ICD-10-CM

## 2016-09-17 DIAGNOSIS — R590 Localized enlarged lymph nodes: Secondary | ICD-10-CM | POA: Diagnosis present

## 2016-09-17 DIAGNOSIS — I7300 Raynaud's syndrome without gangrene: Secondary | ICD-10-CM | POA: Diagnosis present

## 2016-09-17 DIAGNOSIS — M349 Systemic sclerosis, unspecified: Secondary | ICD-10-CM | POA: Diagnosis present

## 2016-09-17 DIAGNOSIS — J189 Pneumonia, unspecified organism: Secondary | ICD-10-CM | POA: Diagnosis present

## 2016-09-17 DIAGNOSIS — K589 Irritable bowel syndrome without diarrhea: Secondary | ICD-10-CM | POA: Diagnosis present

## 2016-09-17 DIAGNOSIS — R1084 Generalized abdominal pain: Secondary | ICD-10-CM | POA: Diagnosis not present

## 2016-09-17 DIAGNOSIS — R627 Adult failure to thrive: Secondary | ICD-10-CM | POA: Diagnosis not present

## 2016-09-17 DIAGNOSIS — K2270 Barrett's esophagus without dysplasia: Secondary | ICD-10-CM | POA: Diagnosis present

## 2016-09-17 DIAGNOSIS — F329 Major depressive disorder, single episode, unspecified: Secondary | ICD-10-CM | POA: Diagnosis present

## 2016-09-17 DIAGNOSIS — E860 Dehydration: Secondary | ICD-10-CM | POA: Diagnosis not present

## 2016-09-17 DIAGNOSIS — J449 Chronic obstructive pulmonary disease, unspecified: Secondary | ICD-10-CM | POA: Diagnosis present

## 2016-09-17 DIAGNOSIS — Z79899 Other long term (current) drug therapy: Secondary | ICD-10-CM

## 2016-09-17 LAB — CBC WITH DIFFERENTIAL/PLATELET
BASOS ABS: 0.1 10*3/uL (ref 0.0–0.1)
Basophils Relative: 0 %
EOS PCT: 1 %
Eosinophils Absolute: 0.2 10*3/uL (ref 0.0–0.7)
HCT: 31 % — ABNORMAL LOW (ref 36.0–46.0)
Hemoglobin: 9.9 g/dL — ABNORMAL LOW (ref 12.0–15.0)
LYMPHS PCT: 17 %
Lymphs Abs: 3.2 10*3/uL (ref 0.7–4.0)
MCH: 32.1 pg (ref 26.0–34.0)
MCHC: 31.9 g/dL (ref 30.0–36.0)
MCV: 100.6 fL — AB (ref 78.0–100.0)
Monocytes Absolute: 1.3 10*3/uL — ABNORMAL HIGH (ref 0.1–1.0)
Monocytes Relative: 7 %
NEUTROS PCT: 75 %
Neutro Abs: 14.7 10*3/uL — ABNORMAL HIGH (ref 1.7–7.7)
PLATELETS: 522 10*3/uL — AB (ref 150–400)
RBC: 3.08 MIL/uL — AB (ref 3.87–5.11)
RDW: 15.5 % (ref 11.5–15.5)
WBC: 19.5 10*3/uL — ABNORMAL HIGH (ref 4.0–10.5)

## 2016-09-17 LAB — URINALYSIS, ROUTINE W REFLEX MICROSCOPIC
Bilirubin Urine: NEGATIVE
GLUCOSE, UA: NEGATIVE mg/dL
Ketones, ur: NEGATIVE mg/dL
LEUKOCYTES UA: NEGATIVE
Nitrite: NEGATIVE
PROTEIN: NEGATIVE mg/dL
Specific Gravity, Urine: >1.046 — ABNORMAL HIGH (ref 1.005–1.030)
pH: 6 (ref 5.0–8.0)

## 2016-09-17 LAB — COMPREHENSIVE METABOLIC PANEL
ALT: 9 U/L — ABNORMAL LOW (ref 14–54)
ANION GAP: 9 (ref 5–15)
AST: 14 U/L — ABNORMAL LOW (ref 15–41)
Albumin: 3.1 g/dL — ABNORMAL LOW (ref 3.5–5.0)
Alkaline Phosphatase: 75 U/L (ref 38–126)
BUN: 9 mg/dL (ref 6–20)
CALCIUM: 9.9 mg/dL (ref 8.9–10.3)
CHLORIDE: 96 mmol/L — AB (ref 101–111)
CO2: 28 mmol/L (ref 22–32)
Creatinine, Ser: 0.54 mg/dL (ref 0.44–1.00)
GFR calc non Af Amer: >60 mL/min (ref >60)
Glucose, Bld: 92 mg/dL (ref 65–99)
Potassium: 3.5 mmol/L (ref 3.5–5.1)
SODIUM: 133 mmol/L — AB (ref 135–145)
Total Bilirubin: 0.7 mg/dL (ref 0.3–1.2)
Total Protein: 7 g/dL (ref 6.5–8.1)

## 2016-09-17 LAB — TROPONIN I

## 2016-09-17 LAB — LACTIC ACID, PLASMA: Lactic Acid, Venous: 1.5 mmol/L (ref 0.5–1.9)

## 2016-09-17 LAB — GLUCOSE, CAPILLARY: Glucose-Capillary: 106 mg/dL — ABNORMAL HIGH (ref 65–99)

## 2016-09-17 LAB — LIPASE, BLOOD: Lipase: 30 U/L (ref 11–51)

## 2016-09-17 MED ORDER — TRAMADOL HCL 50 MG PO TABS
50.0000 mg | ORAL_TABLET | Freq: Four times a day (QID) | ORAL | Status: DC | PRN
Start: 2016-09-17 — End: 2016-09-19
  Administered 2016-09-18: 50 mg via ORAL
  Filled 2016-09-17: qty 1

## 2016-09-17 MED ORDER — IPRATROPIUM BROMIDE 0.02 % IN SOLN: 0.5000 mg | Freq: Four times a day (QID) | RESPIRATORY_TRACT | Status: DC

## 2016-09-17 MED ORDER — DEXTROSE 5 % IV SOLN
INTRAVENOUS | Status: AC
  Filled 2016-09-17 (×2): qty 1

## 2016-09-17 MED ORDER — IOPAMIDOL (ISOVUE-300) INJECTION 61%
100.0000 mL | Freq: Once | INTRAVENOUS | Status: AC | PRN
  Administered 2016-09-17: 100 mL via INTRAVENOUS

## 2016-09-17 MED ORDER — METOPROLOL TARTRATE 25 MG PO TABS: 25.0000 mg | ORAL_TABLET | Freq: Two times a day (BID) | ORAL | Status: DC

## 2016-09-17 MED ORDER — DONEPEZIL HCL 5 MG PO TABS
10.0000 mg | ORAL_TABLET | Freq: Every day | ORAL | Status: DC
  Administered 2016-09-17 – 2016-09-18 (×2): 10 mg via ORAL
  Filled 2016-09-17 (×2): qty 2

## 2016-09-17 MED ORDER — VANCOMYCIN HCL IN DEXTROSE 1-5 GM/200ML-% IV SOLN
1000.0000 mg | Freq: Once | INTRAVENOUS | Status: DC
  Filled 2016-09-17: qty 200

## 2016-09-17 MED ORDER — IPRATROPIUM-ALBUTEROL 0.5-2.5 (3) MG/3ML IN SOLN
3.0000 mL | Freq: Four times a day (QID) | RESPIRATORY_TRACT | Status: DC
  Administered 2016-09-17 – 2016-09-19 (×6): 3 mL via RESPIRATORY_TRACT
  Filled 2016-09-17 (×6): qty 3

## 2016-09-17 MED ORDER — PANTOPRAZOLE SODIUM 40 MG PO TBEC
40.0000 mg | DELAYED_RELEASE_TABLET | Freq: Two times a day (BID) | ORAL | Status: DC
  Administered 2016-09-17 – 2016-09-19 (×4): 40 mg via ORAL
  Filled 2016-09-17 (×4): qty 1

## 2016-09-17 MED ORDER — DEXTROSE 5 % IV SOLN: 2.0000 g | Freq: Three times a day (TID) | INTRAVENOUS | Status: DC

## 2016-09-17 MED ORDER — METOPROLOL TARTRATE 25 MG PO TABS
25.0000 mg | ORAL_TABLET | Freq: Two times a day (BID) | ORAL | Status: DC
  Administered 2016-09-17 – 2016-09-19 (×4): 25 mg via ORAL
  Filled 2016-09-17 (×4): qty 1

## 2016-09-17 MED ORDER — DEXTROSE 5 % IV SOLN
2.0000 g | Freq: Once | INTRAVENOUS | Status: AC
  Administered 2016-09-17: 2 g via INTRAVENOUS
  Filled 2016-09-17: qty 2

## 2016-09-17 MED ORDER — VANCOMYCIN HCL 500 MG IV SOLR
INTRAVENOUS | Status: AC
  Filled 2016-09-17: qty 500

## 2016-09-17 MED ORDER — ENSURE ENLIVE PO LIQD
237.0000 mL | Freq: Two times a day (BID) | ORAL | Status: DC
  Administered 2016-09-19: 237 mL via ORAL

## 2016-09-17 MED ORDER — ALBUTEROL SULFATE (2.5 MG/3ML) 0.083% IN NEBU: 1.2500 mg | INHALATION_SOLUTION | Freq: Four times a day (QID) | RESPIRATORY_TRACT | Status: DC

## 2016-09-17 MED ORDER — SODIUM CHLORIDE 0.9 % IV SOLN
1250.0000 mg | Freq: Once | INTRAVENOUS | Status: AC
  Administered 2016-09-17: 1250 mg via INTRAVENOUS
  Filled 2016-09-17: qty 1250

## 2016-09-17 MED ORDER — DEXTROSE 5 % IV SOLN
1.0000 g | Freq: Three times a day (TID) | INTRAVENOUS | Status: DC
  Administered 2016-09-17 – 2016-09-18 (×2): 1 g via INTRAVENOUS
  Filled 2016-09-17 (×3): qty 1

## 2016-09-17 MED ORDER — ALPRAZOLAM 0.25 MG PO TABS: 0.2500 mg | ORAL_TABLET | Freq: Three times a day (TID) | ORAL | Status: DC | PRN

## 2016-09-17 MED ORDER — ONDANSETRON HCL 4 MG/2ML IJ SOLN: 4.0000 mg | Freq: Four times a day (QID) | INTRAMUSCULAR | Status: DC | PRN

## 2016-09-17 MED ORDER — VANCOMYCIN HCL 500 MG IV SOLR
500.0000 mg | Freq: Two times a day (BID) | INTRAVENOUS | Status: DC
  Administered 2016-09-18: 500 mg via INTRAVENOUS
  Filled 2016-09-17: qty 500

## 2016-09-17 MED ORDER — HEPARIN SODIUM (PORCINE) 5000 UNIT/ML IJ SOLN
5000.0000 [IU] | Freq: Three times a day (TID) | INTRAMUSCULAR | Status: DC
  Administered 2016-09-17 – 2016-09-18 (×2): 5000 [IU] via SUBCUTANEOUS
  Filled 2016-09-17 (×3): qty 1

## 2016-09-17 MED ORDER — LISINOPRIL 5 MG PO TABS
5.0000 mg | ORAL_TABLET | Freq: Every day | ORAL | Status: DC
  Filled 2016-09-17: qty 1

## 2016-09-17 MED ORDER — ESCITALOPRAM OXALATE 10 MG PO TABS
10.0000 mg | ORAL_TABLET | Freq: Every day | ORAL | Status: DC
  Administered 2016-09-18 – 2016-09-19 (×2): 10 mg via ORAL
  Filled 2016-09-17 (×3): qty 1

## 2016-09-17 MED ORDER — ONDANSETRON HCL 4 MG PO TABS: 4.0000 mg | ORAL_TABLET | Freq: Four times a day (QID) | ORAL | Status: DC | PRN

## 2016-09-17 MED ORDER — ALPRAZOLAM 0.25 MG PO TABS
0.2500 mg | ORAL_TABLET | Freq: Three times a day (TID) | ORAL | Status: DC | PRN
  Administered 2016-09-18: 0.25 mg via ORAL
  Filled 2016-09-17: qty 1

## 2016-09-17 MED ORDER — POTASSIUM CHLORIDE IN NACL 20-0.9 MEQ/L-% IV SOLN
INTRAVENOUS | Status: DC
Start: 2016-09-17 — End: 2016-09-18
  Administered 2016-09-17: 23:00:00 via INTRAVENOUS

## 2016-09-17 MED ORDER — ALBUTEROL SULFATE (2.5 MG/3ML) 0.083% IN NEBU: 2.5000 mg | INHALATION_SOLUTION | RESPIRATORY_TRACT | Status: DC | PRN

## 2016-09-17 MED ORDER — METOCLOPRAMIDE HCL 10 MG PO TABS: 5.0000 mg | ORAL_TABLET | Freq: Four times a day (QID) | ORAL | Status: DC | PRN

## 2016-09-17 NOTE — Telephone Encounter (Signed)
Terri - Patient's daughter left this message this morning. Since Dr.Rehman is off campus until Wednesday, will you review this , and make recommendation?

## 2016-09-17 NOTE — ED Notes (Signed)
Reported that pt's daughter called PCP and requested a CT, was instructed to send pt to ED.

## 2016-09-17 NOTE — ED Notes (Signed)
Pt ambulated to BR in room and back, RA sats 89% and O2 applied via [place] at 2 L/M.  sats increased to 95%

## 2016-09-17 NOTE — H&P (Addendum)
History and Physical    Tina [name]:[id number] DOB: 1933-06-17 DOA: 09/17/2016  PCP: Sharilyn Sites, MD   Patient coming from: Home.  I have personally briefly reviewed patient's old medical records in [place]  Chief Complaint: Decreased appetite.  HPI: Tina [name] is a 83 y.o. female with medical history significant of his esophagus, chronic diarrhea, COPD, dementia, essential hypertension, gastritis, gout, H. pylori, IBS, pneumonia, Raynaud's disease, scleroderma, sleep apnea not on CPAP, history of CVA, type 2 diabetes who was recently discharged from the hospital due to pneumonia and is brought to the emergency department due to decreased appetite, weight loss of over 20 pounds in the last 2 weeks. The patient has a history of dementia and is unable to provide a reliable history, so is not sure the patient has had a fever, night sweats, hemoptysis, chest pain, dyspnea, abdominal pain or any other symptomatology.   ED Course: Patient was found to be mildly tachycardic and hypertensive in the emergency department. This was partially due to new diagnosis of left lower lobe mass. The patient and daughter stated that she had not taken her evening meds. This improved after she took her when necessary alprazolam and evening metoprolol. Her WBC is 19.5, hemoglobin 9.9 g/dL and platelets 522. Urinalysis showed WBCs 6-30 per hpf and rare bacteria. Her lactic acid was normal. Sodium 133, potassium 3.5, chloride 96 and bicarbonate 28 mmol/L. Her BUN was 9, creatinine 0.54 and glucose 92 mg/dL. LFTs are unremarkable with the exception of mildly decreased albumin at 3.1 g/dL.  Imaging: Chest radiograph, CT scan of the chest/abdomen and pelvis are consistent with left lower lobe mass with metastasis to adjacent hilar lymph nodes, abdominal lymph nodes and adrenal glands. Please see full radiology reports and images for further detail.  The family was concerned about this not being diagnosed  when the patient was admitted recently for pneumonia, but I discussed with them the previous imaging results from this and last year, with the recent recommendation for follow-up radiological studies to ensure resolution. I told the family members that previous films were consistent with left lower lobe pneumonia.  Review of Systems: As per HPI otherwise 10 point review of systems negative.    Past Medical History:  Diagnosis Date  . Barrett's esophagus   . Chronic diarrhea   . COPD (chronic obstructive pulmonary disease) ([place])   . Dementia   . Essential hypertension, benign   . Gastritis   . Gout   . Helicobacter pylori (H. pylori)   . Irritable bowel syndrome   . Lower abdominal pain   . Pneumonia   . Raynaud's disease   . 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 Scleroderma ([place])   . Sleep apnea    Not on CPAP  . Stroke ([place])   . Type 2 diabetes mellitus ([place])     Past Surgical History:  Procedure Laterality Date  . BACK SURGERY    . CHOLECYSTECTOMY    . COLONOSCOPY  12/2008  . ESOPHAGEAL DILATION N/A 11/02/2015   Procedure: ESOPHAGEAL DILATION;  Surgeon: Rogene Houston, MD;  Location: [facility];  Service: Endoscopy;  Laterality: N/A;  . ESOPHAGEAL DILATION N/A 09/07/2016   Procedure: ESOPHAGEAL DILATION;  Surgeon: Rogene Houston, MD;  Location: [facility];  Service: Endoscopy;  Laterality: N/A;  . ESOPHAGOGASTRODUODENOSCOPY  06/09  . ESOPHAGOGASTRODUODENOSCOPY  02/02/05   [facility] ED  . ESOPHAGOGASTRODUODENOSCOPY  03/08/00   [facility] ED  . ESOPHAGOGASTRODUODENOSCOPY N/A 11/02/2015  Procedure: ESOPHAGOGASTRODUODENOSCOPY (EGD);  Surgeon: Rogene Houston, MD;  [facility];  Service: Endoscopy;  Laterality: N/A;  . ESOPHAGOGASTRODUODENOSCOPY N/A 09/07/2016   Procedure: ESOPHAGOGASTRODUODENOSCOPY (EGD);  Surgeon: Rogene Houston, MD;  [facility];  Service: Endoscopy;  Laterality: N/A;  . INTRAMEDULLARY (IM) NAIL INTERTROCHANTERIC Right 11/04/2013   Procedure:  INTERNAL FIXATION RIGHT HIP WITH GAMMA NAIL;  Surgeon: Carole Civil, MD;  Location: [facility];  Service: Orthopedics;  Laterality: Right;  . LUNG SURGERY  2002   Lung collapsed  . PATELLA FRACTURE SURGERY       reports that she has quit smoking. Her smoking use included Cigarettes. She has never used smokeless tobacco. She reports that she does not drink alcohol or use drugs.  Allergies  Allergen Reactions  . Penicillins Other (See Comments)        Family History  Problem Relation Age of Onset  . Asthma Father   . Melanoma Brother     Prior to Admission medications   Medication Sig Start Date End Date Taking? Authorizing Provider  acetaminophen (TYLENOL) 500 MG tablet Take 500 mg by mouth 2 (two) times daily.   Yes [provider]  ALPRAZolam (XANAX) 0.25 MG tablet Take 1 tablet (0.25 mg total) by mouth 3 (three) times daily as needed for anxiety. Patient taking differently: Take 0.25 mg by mouth 3 (three) times daily as needed for anxiety or sleep.  11/06/13  Yes Black, Lezlie Octave, NP  Cholecalciferol (VITAMIN D PO) Take 1 tablet by mouth every morning.   Yes [provider]  clindamycin (CLEOCIN) 150 MG capsule Take 1 capsule by mouth 4 (four) times daily. 09/12/16  Yes [provider]  donepezil (ARICEPT) 10 MG tablet Take 10 mg by mouth at bedtime.   Yes [provider]  escitalopram (LEXAPRO) 10 MG tablet Take 10 mg by mouth daily.   Yes [provider]  ferrous sulfate 325 (65 FE) MG tablet Take 325 mg by mouth every morning.   Yes [provider]  Glycopyrrolate-Formoterol (BEVESPI AEROSPHERE) 9-4.8 MCG/ACT AERO Inhale 2 puffs into the lungs 2 (two) times daily.   Yes [provider]  lisinopril (PRINIVIL,ZESTRIL) 5 MG tablet Take 5 mg by mouth daily.   Yes [provider]  Menthol, Topical Analgesic, 154 MG PADS Apply 1 each topically daily as needed (for arthritis pain to right arm-shoulder).   Yes [provider]  metoCLOPramide (REGLAN) 5 MG tablet Take 5 mg by mouth 4 (four) times daily as needed for nausea or vomiting.  09/14/16  Yes [provider]  pantoprazole (PROTONIX) 40 MG tablet Take 1 tablet (40 mg total) by mouth 2 (two) times daily. 11/04/15  Yes Isaac Bliss, Rayford Halsted, MD  SPIRIVA HANDIHALER 18 MCG inhalation capsule USE 1 CAPSULE VIA HANDIHALER ONCE DAILY 07/20/15  Yes [provider]  traMADol (ULTRAM) 50 MG tablet TAKE 1 TABLET BY MOUTH EVERY 6 HOURS AS NEEDED 02/27/16  Yes Carole Civil, MD  VENTOLIN HFA 108 (90 Base) MCG/ACT inhaler Inhale 2 puffs into the lungs every 4 (four) hours as needed for wheezing or shortness of breath.  09/12/16  Yes [provider]  vitamin B-12 (CYANOCOBALAMIN) 250 MCG tablet Take 250 mcg by mouth daily.    Yes [provider]  vitamin E 400 UNIT capsule Take 400 Units by mouth daily.   Yes [provider]  metoprolol tartrate (LOPRESSOR) 25 MG tablet Take 25 mg by mouth 2 (  two) times daily.     [provider]    Physical Exam:  Constitutional: NAD, calm, comfortable Vitals:   09/17/16 2000 09/17/16 2015 09/17/16 2100 09/17/16 2153  BP: (!) 174/86  (!) 182/75 (!) 158/65  Pulse: (!) 108 (!) 107 (!) 107 (!) 103  Resp: 19 (!) 28 (!) 21 20  Temp:    98 F (36.7 C)  TempSrc:    Oral  SpO2: 95% 96% 95% 95%  Weight:    64.3 kg (141 lb 12.1 oz)  Height:    5\' 7"  (1.702 m)   Eyes: PERRL, lids and conjunctivae normal ENMT: Mucous membranes are moist. Posterior pharynx clear of any exudate or lesions. Neck: normal, supple, no masses, no thyromegaly Respiratory: Decreased breath sounds on left base, no wheezing, no crackles. Normal respiratory effort. No accessory muscle use.  Cardiovascular: Tachycardic at 104 BPM, no murmurs / rubs / gallops. No extremity edema. 2+ pedal pulses. No carotid bruits.  Abdomen: Soft, no tenderness, no masses palpated. No hepatosplenomegaly. Bowel sounds  positive.  Musculoskeletal: no clubbing / cyanosis.Good ROM, no contractures. Normal muscle tone.  Skin: Mildly decreased turgor, but no significant rashes, lesions, ulcers on limited dermatological exam.         Neurologic: CN 2-12 grossly intact. Sensation intact, DTR normal. Strength 5/5 in all 4.  Psychiatric: Alert and oriented x 1, partially oriented to place ( she knew that she was at a hospital, but she thought that was in [place]), disoriented to time. She understood the high probability that this is lung cancer. Anxious mood.    Labs on Admission: I have personally reviewed following labs and imaging studies  CBC:  Recent Labs Lab 09/17/16 1448  WBC 19.5*  NEUTROABS 14.7*  HGB 9.9*  HCT 31.0*  MCV 100.6*  PLT 242*   Basic Metabolic Panel:  Recent Labs Lab 09/17/16 1448  NA 133*  K 3.5  CL 96*  CO2 28  GLUCOSE 92  BUN 9  CREATININE 0.54  CALCIUM 9.9   GFR: Estimated Creatinine Clearance: 53.6 mL/min (by C-G formula based on SCr of 0.54 mg/dL). Liver Function Tests:  Recent Labs Lab 09/17/16 1448  AST 14*  ALT 9*  ALKPHOS 75  BILITOT 0.7  PROT 7.0  ALBUMIN 3.1*    Recent Labs Lab 09/17/16 1448  LIPASE 30   No results for input(s): AMMONIA in the last 168 hours. Coagulation Profile: No results for input(s): INR, PROTIME in the last 168 hours. Cardiac Enzymes:  Recent Labs Lab 09/17/16 1448  TROPONINI <0.03   BNP (last 3 results) No results for input(s): PROBNP in the last 8760 hours. HbA1C: No results for input(s): HGBA1C in the last 72 hours. CBG: No results for input(s): GLUCAP in the last 168 hours. Lipid Profile: No results for input(s): CHOL, HDL, LDLCALC, TRIG, CHOLHDL, LDLDIRECT in the last 72 hours. Thyroid Function Tests: No results for input(s): TSH, T4TOTAL, FREET4, T3FREE, THYROIDAB in the last 72 hours. Anemia Panel: No results for input(s): VITAMINB12, FOLATE, FERRITIN, TIBC, IRON, RETICCTPCT in the last 72  hours. Urine analysis:    Component Value Date/Time   COLORURINE YELLOW 09/17/2016 1414   APPEARANCEUR HAZY (A) 09/17/2016 1414   LABSPEC >1.046 (H) 09/17/2016 1414   PHURINE 6.0 09/17/2016 1414   GLUCOSEU NEGATIVE 09/17/2016 1414   HGBUR SMALL (A) 09/17/2016 1414   BILIRUBINUR NEGATIVE 09/17/2016 1414   KETONESUR NEGATIVE 09/17/2016 1414   PROTEINUR NEGATIVE 09/17/2016 1414   UROBILINOGEN 0.2 04/25/2014 2025  NITRITE NEGATIVE 09/17/2016 1414   LEUKOCYTESUR NEGATIVE 09/17/2016 1414    Radiological Exams on Admission: Dg Chest 2 View  Result Date: 09/17/2016 CLINICAL DATA:  Abdominal, weight loss EXAM: CHEST  2 VIEW COMPARISON:  09/04/2016 FINDINGS: Postsurgical changes of the right lung with calcified pleural plaque. Small left pleural effusion. Left lower lobe airspace disease. No pneumothorax. Stable cardiomediastinal silhouette. No acute osseous abnormality. IMPRESSION: 1. Small left pleural effusion. Left lower lobe airspace disease concerning for pneumonia. Followup PA and lateral chest X-ray is recommended in 3-4 weeks following trial of antibiotic therapy to ensure resolution and exclude underlying malignancy. Electronically Signed   By: Kathreen Devoid   On: 09/17/2016 14:43   Ct Chest Wo Contrast  Result Date: 09/17/2016 CLINICAL DATA:  Masslike opacity left lower lobe on abdomen CT earlier today. EXAM: CT CHEST WITHOUT CONTRAST TECHNIQUE: Multidetector CT imaging of the chest was performed following the standard protocol without IV contrast. COMPARISON:  Abdomen CT 09/17/2016.  Chest CT 09/05/2015. FINDINGS: Cardiovascular: Heart size normal. No substantial pericardial effusion. Coronary artery calcification is noted. Atherosclerotic calcification is noted in the wall of the thoracic aorta. Mediastinum/Nodes: 2.4 cm lymph node identified in the left hilum 1.4 cm short axis AP window lymph node. 2.1 cm lymph node identified in the inferior left hilum. Lungs/Pleura: Emphysema again  noted with biapical pleural-parenchymal scarring. Small left pleural effusion evident. 2.9 cm nodular opacity associated with the left major fissure may be loculated fluid or pleural disease. Large 9.4 x 9.3 cm mass lesion identified largely replacing the inferior left lower lobe. Calcified pleural plaque identified anterior right hemithorax. Upper Abdomen: Bilateral adrenal lesions approaching 3 cm and better characterized on the recent CT. Omental nodules are again noted in the region of the splenic flexure. Musculoskeletal: Bone windows reveal no worrisome lytic or sclerotic osseous lesions. IMPRESSION: 1. Large left lower lobe mass with left hilar and mediastinal lymphadenopathy. Imaging features are consistent with primary bronchogenic neoplasm. 2. Small left pleural effusion. 3. Probable bilateral adrenal and splenic omental metastases. Emphysema. ([id number]) Abdominal Aortic Atherosclerois (ICD10-170.0) Electronically Signed   By: Misty Stanley M.D.   On: 09/17/2016 17:58   Ct Abdomen Pelvis W Contrast  Result Date: 09/17/2016 CLINICAL DATA:  Abdominal pain, nausea and unintentional weight loss. Diabetes. EXAM: CT ABDOMEN AND PELVIS WITH CONTRAST TECHNIQUE: Multidetector CT imaging of the abdomen and pelvis was performed using the standard protocol following bolus administration of intravenous contrast. CONTRAST:  128mL ISOVUE-300 IOPAMIDOL (ISOVUE-300) INJECTION 61% COMPARISON:  08/04/2010 chest CT, chest CT 06/12/2004 09/17/2016 CXR FINDINGS: Lower chest: Masslike abnormality at the left lung base, partially included with associated small pleural effusion and areas of heterogeneity within likely representing areas of necrosis. This measures 7.6 x 9.2 cm in AP by transverse dimension. The cephalad margin is not entirely excluded. Findings are concerning for neoplasm and possible adjacent left hilar adenopathy. Dedicated chest CT may help for further evaluation of the extent of this abnormality. Tiny 5  mm nodular density abutting the pleura within the lingula is also seen. The right lung demonstrates no acute abnormality. Heart is normal in size. There is debris seen within the distal esophagus. Hepatobiliary: No focal liver abnormality is seen. Status post cholecystectomy. No biliary dilatation. Pancreas: Unremarkable. No pancreatic ductal dilatation or surrounding inflammatory changes. Spleen: Normal in size without focal abnormality. Adrenals/Urinary Tract: New left adrenal mass, measuring 2.8 x 1.8 x 2.4 cm with interval increase in size of heterogeneously enhancing right adrenal mass measuring 3.2 x 3.2  x 5.3 cm versus 2 cm in 2012 and 2006. Stomach/Bowel: Contracted stomach with normal small bowel rotation. No fold or mural thickening is noted of small intestine. There is diffuse colonic diverticulosis without acute diverticulitis, more severely affecting the sigmoid colon with circular muscle hypertrophy noted. Normal appendix. Vascular/Lymphatic: Bilateral upper abdominal mesenteric masses with enhancement, in the right hemiabdomen, series 2 image 40 and coronal image 23 measuring 2.3 x 1.5 x 1.9 cm pain in the left upper quadrant measuring 2.3 x 2.5 x 1.7 cm, series 2, image 22, coronal image 41 and subdiaphragmatic in the left upper quadrant measuring 2 x 2 x 2.2 cm, series 2, image 15 and coronal image 41. No pelvic sidewall or inguinal lymphadenopathy. Small subcutaneous nodules in the right lower quadrant the largest approximately 6 mm. Aortic atherosclerosis without aneurysm. Reproductive: Uterus and bilateral adnexa are unremarkable. Other: No free air free fluid. Musculoskeletal: Right femoral nail fixation. No apparent lytic or blastic disease. Degenerative disc disease L5-S1. IMPRESSION: 1. There is a masslike abnormality at the left lung base with associated small left pleural effusion, incompletely imaged on this exam but concerning for neoplasm. This measures approximately 7.6 x 9.2 cm in AP  by transverse dimension. Tiny 5 mm pleural-based nodular densities also seen in the lingula. Dedicated chest CT may help for further correlation. 2. Bilateral adrenal masses concerning for metastasis, new on the left and increased on the right since prior comparison studies. The left adrenal mass measures 2.8 x 1.8 x 2.4 cm and on the right, 3.2 x 3.2 x 5.3 cm versus 2 cm previously. 3. Soft tissue masses within the mesentery of the upper abdomen consistent with metastatic mesenteric lymphadenopathy, measuring 2.3 x 1.5 x 1.9 cm in the right hemiabdomen and two in the left upper quadrant measuring 2.3 x 2.5 x 1.7 cm and 2 x 2 x 2.2 cm. 4. Aortic atherosclerosis. 5. Extensive colonic diverticulosis. Electronically Signed   By: Ashley Royalty M.D.   On: 09/17/2016 16:37   09/07/2015 transthoracic echocardiography ------------------------------------------------------------------- LV EF: 60% -   65%  ------------------------------------------------------------------- Indications:      Chest pain 786.51.  ------------------------------------------------------------------- History:   PMH:   Chronic obstructive pulmonary disease.  Risk factors:  Hypertension. Diabetes mellitus. Dyslipidemia.  ------------------------------------------------------------------- Study Conclusions  - Left ventricle: The cavity size was normal. Wall thickness was   increased in a pattern of mild LVH. Systolic function was normal.   The estimated ejection fraction was in the range of 60% to 65%.   Wall motion was normal; there were no regional wall motion   abnormalities. Doppler parameters are consistent with abnormal   left ventricular relaxation (grade 1 diastolic dysfunction).   Doppler parameters are consistent with high ventricular filling   pressure. - Aortic valve: Valve area (VTI): 2.13 cm^2. Valve area (Vmax):   2.13 cm^2. - Left atrium: The atrium was mildly dilated. - Atrial septum: No defect or patent  foramen ovale was identified. - Pulmonary arteries: Systolic pressure was mildly increased. PA   peak pressure: 36 mm Hg (S). - Technically adequate study.   EKG: Independently reviewed. Vent. rate 100 BPM PR interval * ms QRS duration 94 ms QT/QTc 356/460 ms P-R-T axes 89 27 66 Sinus tachycardia Baseline wander  Assessment/Plan Principal Problem:   Mass of lower lobe of left lung. Family members not amenable to transfer at this time. There is no pulmonology service available at the moment, but her daughter stated that she has established care with pulmonology at  [facility] to telemetry/inpatient. Continue supplemental oxygen Continue antibiotic treatment for pneumonia. Consults oncology in the morning. Consider evaluation by palliative care, given history of dementia and advanced state of malignancy.   Active Problems:   HCAP (healthcare-associated pneumonia) I explained to the daughters that there was no significant evidence of malignancy reported on previous chest radiograph during her last admission and there was suggestion to have follow-up imaging to ensure resolution or diagnoses. I also discussed with them that there was no evidence of malignancy on previous CT chest scan done in May 2017.  Check sputum Gram stain, culture and sensitivity. Check strep pneumoniae urinary antigen. Continue supplemental oxygen. Scheduled and as needed bronchodilators. HCAP antibiotic therapy coverage    Hypertension Continue lisinopril 5 mg by mouth daily. Continue metoprolol 25 mg by mouth twice a day. Monitor blood pressure, renal function and electrolytes.    High cholesterol Continue lifestyle modifications. Not on medical treatment.    COPD (chronic obstructive pulmonary disease) ([place]) Does not seem to be exacerbated at this time. Continue supplemental oxygen. Continue bronchodilators.  Continue pneumonia treatment. Resume Spir[facility] [employer] when  discharged.    Barrett's esophagus Pantoprazole 40 mg by mouth twice daily    Type 2 diabetes mellitus (HCC) Carbohydrate modified diet. CBG monitoring while in the hospital.    Depression Continue Lexapro 10 mg by mouth daily.      Sleep apnea Not on CPAP. Supplemental oxygen at bedtime while in the hospital.    Anemia Monitor hematocrit and hemoglobin. Continue iron supplementation.    DVT prophylaxis: Heparin SQ. Code Status: DO NOT RESUSCITATE/DO NOT INTUBATE. Family Communication: Her daughter Doreene Adas is a Marine scientist and makes decisions for her ankles consultation with her sister Reatha Harps. Her cell phone number is [phone]. Her work number is [phone]. Disposition Plan: Admit for HCAP Treatment and evaluation by oncology. Consults called: Oncology for a.m. Pulmonology is not available at this time. Admission status: Inpatient/telemetry.   Reubin Milan MD [facility] Pager [phone].  If 7PM-7AM, please contact night-coverage [email or web address] Password [facility]  09/17/2016, 9:56 PM   This H&P was dictated with Animal nutritionist and may have some unintended dictation misspellings or mispronunciation or other transcriptions errors.

## 2016-09-17 NOTE — Telephone Encounter (Signed)
I advised daughter to take patient to the ED since she has had so much weight loss. Also take the Reglan twice a day instead of 4 times a day

## 2016-09-17 NOTE — Telephone Encounter (Signed)
Patient's daughter, Reatha Harps called, stated that she is still having pain in her stomach and nauseated.  Stated that she can eat maybe four bites and that's it because she gets sick and can't eat. Stated that she has lost 20lbs in three weeks.  She would like to know if Dr. Laural Golden would consider doing a colonoscopy or a CT scan of her stomach to see what is going on.    [phone]

## 2016-09-17 NOTE — ED Provider Notes (Signed)
[facility] DEPT Provider Note   CSN: [id number] Arrival date & time: 09/17/16  1350     History   Chief Complaint Chief Complaint  Patient presents with  . Abdominal Pain    HPI Tina [name] is a 83 y.o. female.  The history is provided by the patient and a relative. The history is limited by the condition of the patient (Hx dementia).  Abdominal Pain    Pt was seen at 1405. Per pt's family: Pt with "20 pound weight loss" over the past 3 weeks. Pt has had decreased appetite and been c/o nausea and abd pain. Pt's family called pt's GI Dr. Laural Golden and was told to bring her to the ED for evaluation. Pt herself has hx of dementia and does not know why she is in the ED.   Past Medical History:  Diagnosis Date  . Barrett's esophagus   . Chronic diarrhea   . COPD (chronic obstructive pulmonary disease) ([place])   . Dementia   . Essential hypertension, benign   . Gastritis   . Gout   . Helicobacter pylori (H. pylori)   . Irritable bowel syndrome   . Lower abdominal pain   . Pneumonia   . Raynaud's disease   . 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 Scleroderma ([place])   . Sleep apnea    Not on CPAP  . Stroke ([place])   . Type 2 diabetes mellitus [facility])     Patient Active Problem List   Diagnosis Date Noted  . Depression 09/04/2016  . Scleroderma ([place]) 09/04/2016  . Iron deficiency anemia 09/04/2016  . Hyponatremia 09/04/2016  . Nausea & vomiting 09/04/2016  . Upper GI bleeding 11/02/2015  . UGIB (upper gastrointestinal bleed) 11/02/2015  . Pneumonia 09/05/2015  . CAP (community acquired pneumonia) 09/05/2015  . Intertrochanteric fracture of right femur ([place]) 11/04/2013  . Preoperative cardiovascular examination 11/03/2013  . Fall at home 11/02/2013  . Hip fracture, right ([place]) 11/02/2013  . Closed right hip fracture ([place]) 11/02/2013  . Chest pain 05/05/2013  . DM (diabetes mellitus) ([place]) 05/05/2013  . Hypertension 08/01/2011  . High cholesterol 08/01/2011  . Gout 08/01/2011   . Raynaud's disease 08/01/2011  . COPD (chronic obstructive pulmonary disease) ([place]) 08/01/2011  . Barrett's esophagus 08/01/2011  . Esophageal dysphagia 08/01/2011    Past Surgical History:  Procedure Laterality Date  . BACK SURGERY    . CHOLECYSTECTOMY    . COLONOSCOPY  12/2008  . ESOPHAGEAL DILATION N/A 11/02/2015   Procedure: ESOPHAGEAL DILATION;  Surgeon: Rogene Houston, MD;  Location: [facility];  Service: Endoscopy;  Laterality: N/A;  . ESOPHAGEAL DILATION N/A 09/07/2016   Procedure: ESOPHAGEAL DILATION;  Surgeon: Rogene Houston, MD;  Location: [facility] ENDO SUITE;  Service: Endoscopy;  Laterality: N/A;  . ESOPHAGOGASTRODUODENOSCOPY  06/09  . ESOPHAGOGASTRODUODENOSCOPY  02/02/05   EGD ED  . ESOPHAGOGASTRODUODENOSCOPY  03/08/00   EGD ED  . ESOPHAGOGASTRODUODENOSCOPY N/A 11/02/2015   Procedure: ESOPHAGOGASTRODUODENOSCOPY (EGD);  Surgeon: Rogene Houston, MD;  Location: [facility] END[facility] SUITE;  Service: Endoscopy;  Laterality: N/A;  . ESOPHAGOGASTRODUODENOSCOPY N/A 09/07/2016   Procedure: ESOPHAGOGASTRODUODENOSCOPY (EGD);  Surgeon: Rogene Houston, MD;  Location: [facility] SUITE;  Service: Endoscopy;  Laterality: N/A;  . INTRAMEDULLARY (IM) NAIL INTERTROCHANTERIC Right 11/04/2013   Procedure: INTERNAL FIXATION RIGHT HIP WITH GAMMA NAIL;  Surgeon: Carole Civil, MD;  Location: [facility] ORS;  Service: Orthopedics;  Laterality: Right;  . LUNG SURGERY  2002   Lung collapsed  . PATELLA  FRACTURE SURGERY      OB History    No data available       Home Medications    Prior to Admission medications   Medication Sig Start Date End Date Taking? Authorizing Provider  acetaminophen (TYLENOL) 500 MG tablet Take 500 mg by mouth 2 (two) times daily.    [provider]  allopurinol (ZYLOPRIM) 100 MG tablet Take 100 mg by mouth 3 (three) times daily.     [provider]  ALPRAZolam Duanne Moron) 0.25 MG tablet Take 1 tablet (0.25 mg total) by mouth 3 (three) times daily as needed for  anxiety. Patient taking differently: Take 0.25 mg by mouth 3 (three) times daily as needed for anxiety or sleep.  11/06/13   Black, Lezlie Octave, NP  Cholecalciferol (VITAMIN D PO) Take 1 tablet by mouth every morning.    [provider]  citalopram (CELEXA) 20 MG tablet Take 20 mg by mouth at bedtime.     [provider]  diltiazem (CARTIA XT) 180 MG 24 hr capsule Take 180 mg by mouth 2 (two) times daily.    [provider]  donepezil (ARICEPT) 10 MG tablet Take 10 mg by mouth at bedtime.    [provider]  escitalopram (LEXAPRO) 10 MG tablet Take 10 mg by mouth daily.    [provider]  ferrous sulfate 325 (65 FE) MG tablet Take 325 mg by mouth every morning.    [provider]  Glycopyrrolate-Formoterol (BEVESPI AEROSPHERE) 9-4.8 MCG/ACT AERO Inhale 2 puffs into the lungs 2 (two) times daily.    [provider]  hydrochlorothiazide (HYDRODIURIL) 25 MG tablet Take 1 tablet (25 mg total) by mouth every morning. Resume on 11/13/13. 11/06/13   Radene Gunning, NP  lisinopril (PRINIVIL,ZESTRIL) 5 MG tablet Take 5 mg by mouth daily.    [provider]  Menthol, Topical Analgesic, 154 MG PADS Apply 1 each topically daily as needed (for arthritis pain to right arm-shoulder).    [provider]  metoprolol tartrate (LOPRESSOR) 25 MG tablet Take 25 mg by mouth 2 (two) times daily.     [provider]  mirtazapine (REMERON) 30 MG tablet Take 30 mg by mouth at bedtime.     [provider]  ondansetron (ZOFRAN) 4 MG tablet Take 4 mg by mouth 2 (two) times daily.    [provider]  pantoprazole (PROTONIX) 40 MG tablet Take 1 tablet (40 mg total) by mouth 2 (two) times daily. 11/04/15   Isaac Bliss, Rayford Halsted, MD  SPIRIVA HANDIHALER 18 MCG inhalation capsule USE 1 CAPSULE VIA HANDIHALER ONCE DAILY 07/20/15   [provider]  tolterodine (DETROL) 2 MG tablet Take 2 mg by mouth 2 (two) times daily.     [provider]  traMADol (ULTRAM) 50 MG tablet TAKE 1 TABLET BY MOUTH EVERY 6 HOURS AS NEEDED 02/27/16   Carole Civil, MD  vitamin B-12 (CYANOCOBALAMIN) 250 MCG tablet Take 250 mcg by mouth daily.     [provider]  vitamin E 400 UNIT capsule Take 400 Units by mouth daily.    [provider]  zolpidem (AMBIEN) 10 MG tablet Take 10 mg by mouth at bedtime as needed for sleep.    [provider]    Family History Family History  Problem Relation Age of Onset  . Asthma Father   . Melanoma Brother     Social History Social History  Substance Use Topics  . Smoking status:  Former Smoker    Types: Cigarettes  . Smokeless tobacco: Never Used  . Alcohol use No     Allergies   Penicillins   Review of Systems Review of Systems  Gastrointestinal: Positive for abdominal pain.     Physical Exam Updated Vital Signs BP (!) 167/64 (BP Location: Left Arm)   Pulse (!) 107   Temp 98.1 F (36.7 C) (Oral)   Resp 18   Ht 5\' 7"  (1.702 m)   Wt 65.8 kg (145 lb)   SpO2 94%   BMI 22.71 kg/m    15:06 Orthostatic Vital Signs JM  Orthostatic Lying   BP- Lying: 159/69  Pulse- Lying: 99      Orthostatic Sitting  BP- Sitting: 177/75  Pulse- Sitting: 115      Orthostatic Standing at 0 minutes  BP- Standing at 0 minutes: 165/65  Pulse- Standing at 0 minutes: 113     Physical Exam 1410: Physical examination:  Nursing notes reviewed; Vital signs and O2 SAT reviewed;  Constitutional: Well developed, Well nourished, In no acute distress; Head:  Normocephalic, atraumatic; Eyes: EOMI, PERRL, No scleral icterus; ENMT: Mouth and pharynx normal, Mucous membranes dry; Neck: Supple, Full range of motion, No lymphadenopathy; Cardiovascular: Tachycardic rate and rhythm, No gallop; Respiratory: Breath sounds clear & equal bilaterally, No wheezes.  Speaking full sentences with ease, Normal respiratory effort/excursion; Chest: Nontender, Movement normal;  Abdomen: Soft, +mild diffuse tenderness to palp. No rebound or guarding. Nondistended, Normal bowel sounds; Genitourinary: No CVA tenderness; Extremities: Pulses normal, No tenderness, No edema, No calf edema or asymmetry.; Neuro: Awake, alert, confused re: time, place, events. Major CN grossly intact. No facial droop.  Speech clear. Moves all extremities on stretcher spontaneously and to command without apparent gross focal motor deficits.; Skin: Color normal, Warm, Dry.   ED Treatments / Results  Labs (all labs ordered are listed, but only abnormal results are displayed)   EKG  EKG Interpretation  Date/Time:  Monday September 17 2016 14:59:02 EDT Ventricular Rate:  100 PR Interval:    QRS Duration: 94 QT Interval:  356 QTC Calculation: 460 R Axis:   27 Text Interpretation:  Sinus tachycardia Baseline wander When compared with ECG of 09/04/2016 No significant change was found Confirmed by [facility]  MD, Nunzio Cory [phone]) on 09/17/2016 3:33:53 PM       Radiology   Procedures Procedures (including critical care time)  Medications Ordered in ED Medications - No data to display   Initial Impression / Assessment and Plan / ED Course  I have reviewed the triage vital signs and the nursing notes.  Pertinent labs & imaging results that were available during my care of the patient were reviewed by me and considered in my medical decision making (see chart for details).  MDM Reviewed: previous chart, nursing note and vitals Reviewed previous: labs and ECG Interpretation: labs, ECG, x-ray and CT scan   Results for orders placed or performed during the hospital encounter of 09/17/16  Comprehensive metabolic panel  Result Value Ref Range   Sodium 133 (L) 135 - 145 mmol/L   Potassium 3.5 3.5 - 5.1 mmol/L   Chloride 96 (L) 101 - 111 mmol/L   CO2 28 22 - 32 mmol/L   Glucose, Bld 92 65 - 99 mg/dL   BUN 9 6 - 20 mg/dL   Creatinine, Ser 0.54 0.44 - 1.00 mg/dL   Calcium 9.9 8.9 - 10.3 mg/dL    Total Protein 7.0 6.5 - 8.1 g/dL   Albumin  3.1 (L) 3.5 - 5.0 g/dL   AST 14 (L) 15 - 41 U/L   ALT 9 (L) 14 - 54 U/L   Alkaline Phosphatase 75 38 - 126 U/L   Total Bilirubin 0.7 0.3 - 1.2 mg/dL   GFR calc non Af Amer >60 >60 mL/min   GFR calc Af Amer >60 >60 mL/min   Anion gap 9 5 - 15  Lipase, blood  Result Value Ref Range   Lipase 30 11 - 51 U/L  Troponin I  Result Value Ref Range   Troponin I <0.03 <0.03 ng/mL  CBC with Differential  Result Value Ref Range   WBC 19.5 (H) 4.0 - 10.5 K/uL   RBC 3.08 (L) 3.87 - 5.11 MIL/uL   Hemoglobin 9.9 (L) 12.0 - 15.0 g/dL   HCT 31.0 (L) 36.0 - 46.0 %   MCV 100.6 (H) 78.0 - 100.0 fL   MCH 32.1 26.0 - 34.0 pg   MCHC 31.9 30.0 - 36.0 g/dL   RDW 15.5 11.5 - 15.5 %   Platelets 522 (H) 150 - 400 K/uL   Neutrophils Relative % 75 %   Neutro Abs 14.7 (H) 1.7 - 7.7 K/uL   Lymphocytes Relative 17 %   Lymphs Abs 3.2 0.7 - 4.0 K/uL   Monocytes Relative 7 %   Monocytes Absolute 1.3 (H) 0.1 - 1.0 K/uL   Eosinophils Relative 1 %   Eosinophils Absolute 0.2 0.0 - 0.7 K/uL   Basophils Relative 0 %   Basophils Absolute 0.1 0.0 - 0.1 K/uL  Urinalysis, Routine w reflex microscopic  Result Value Ref Range   Color, Urine YELLOW YELLOW   APPearance HAZY (A) CLEAR   Specific Gravity, Urine >1.046 (H) 1.005 - 1.030   pH 6.0 5.0 - 8.0   Glucose, UA NEGATIVE NEGATIVE mg/dL   Hgb urine dipstick SMALL (A) NEGATIVE   Bilirubin Urine NEGATIVE NEGATIVE   Ketones, ur NEGATIVE NEGATIVE mg/dL   Protein, ur NEGATIVE NEGATIVE mg/dL   Nitrite NEGATIVE NEGATIVE   Leukocytes, UA NEGATIVE NEGATIVE   RBC / HPF 0-5 0 - 5 RBC/hpf   WBC, UA 6-30 0 - 5 WBC/hpf   Bacteria, UA RARE (A) NONE SEEN   Squamous Epithelial / LPF 6-30 (A) NONE SEEN  Lactic acid, plasma  Result Value Ref Range   Lactic Acid, Venous 1.5 0.5 - 1.9 mmol/L   Dg Chest 2 View Result Date: 09/17/2016 CLINICAL DATA:  Abdominal, weight loss EXAM: CHEST  2 VIEW COMPARISON:  09/04/2016 FINDINGS:  Postsurgical changes of the right lung with calcified pleural plaque. Small left pleural effusion. Left lower lobe airspace disease. No pneumothorax. Stable cardiomediastinal silhouette. No acute osseous abnormality. IMPRESSION: 1. Small left pleural effusion. Left lower lobe airspace disease concerning for pneumonia. Followup PA and lateral chest X-ray is recommended in 3-4 weeks following trial of antibiotic therapy to ensure resolution and exclude underlying malignancy. Electronically Signed   By: Kathreen Devoid   On: 09/17/2016 14:43   Ct Chest Wo Contrast Result Date: 09/17/2016 CLINICAL DATA:  Masslike opacity left lower lobe on abdomen CT earlier today. EXAM: CT CHEST WITHOUT CONTRAST TECHNIQUE: Multidetector CT imaging of the chest was performed following the standard protocol without IV contrast. COMPARISON:  Abdomen CT 09/17/2016.  Chest CT 09/05/2015. FINDINGS: Cardiovascular: Heart size normal. No substantial pericardial effusion. Coronary artery calcification is noted. Atherosclerotic calcification is noted in the wall of the thoracic aorta. Mediastinum/Nodes: 2.4 cm lymph node identified in the left hilum 1.4 cm short  axis AP window lymph node. 2.1 cm lymph node identified in the inferior left hilum. Lungs/Pleura: Emphysema again noted with biapical pleural-parenchymal scarring. Small left pleural effusion evident. 2.9 cm nodular opacity associated with the left major fissure may be loculated fluid or pleural disease. Large 9.4 x 9.3 cm mass lesion identified largely replacing the inferior left lower lobe. Calcified pleural plaque identified anterior right hemithorax. Upper Abdomen: Bilateral adrenal lesions approaching 3 cm and better characterized on the recent CT. Omental nodules are again noted in the region of the splenic flexure. Musculoskeletal: Bone windows reveal no worrisome lytic or sclerotic osseous lesions. IMPRESSION: 1. Large left lower lobe mass with left hilar and mediastinal  lymphadenopathy. Imaging features are consistent with primary bronchogenic neoplasm. 2. Small left pleural effusion. 3. Probable bilateral adrenal and splenic omental metastases. Emphysema. ([id number]) Abdominal Aortic Atherosclerois (ICD10-170.0) Electronically Signed   By: Misty Stanley M.D.   On: 09/17/2016 17:58   Ct Abdomen Pelvis W Contrast Result Date: 09/17/2016 CLINICAL DATA:  Abdominal pain, nausea and unintentional weight loss. Diabetes. EXAM: CT ABDOMEN AND PELVIS WITH CONTRAST TECHNIQUE: Multidetector CT imaging of the abdomen and pelvis was performed using the standard protocol following bolus administration of intravenous contrast. CONTRAST:  178mL ISOVUE-300 IOPAMIDOL (ISOVUE-300) INJECTION 61% COMPARISON:  08/04/2010 chest CT, chest CT 06/12/2004 09/17/2016 CXR FINDINGS: Lower chest: Masslike abnormality at the left lung base, partially included with associated small pleural effusion and areas of heterogeneity within likely representing areas of necrosis. This measures 7.6 x 9.2 cm in AP by transverse dimension. The cephalad margin is not entirely excluded. Findings are concerning for neoplasm and possible adjacent left hilar adenopathy. Dedicated chest CT may help for further evaluation of the extent of this abnormality. Tiny 5 mm nodular density abutting the pleura within the lingula is also seen. The right lung demonstrates no acute abnormality. Heart is normal in size. There is debris seen within the distal esophagus. Hepatobiliary: No focal liver abnormality is seen. Status post cholecystectomy. No biliary dilatation. Pancreas: Unremarkable. No pancreatic ductal dilatation or surrounding inflammatory changes. Spleen: Normal in size without focal abnormality. Adrenals/Urinary Tract: New left adrenal mass, measuring 2.8 x 1.8 x 2.4 cm with interval increase in size of heterogeneously enhancing right adrenal mass measuring 3.2 x 3.2 x 5.3 cm versus 2 cm in 2012 and 2006. Stomach/Bowel:  Contracted stomach with normal small bowel rotation. No fold or mural thickening is noted of small intestine. There is diffuse colonic diverticulosis without acute diverticulitis, more severely affecting the sigmoid colon with circular muscle hypertrophy noted. Normal appendix. Vascular/Lymphatic: Bilateral upper abdominal mesenteric masses with enhancement, in the right hemiabdomen, series 2 image 40 and coronal image 23 measuring 2.3 x 1.5 x 1.9 cm pain in the left upper quadrant measuring 2.3 x 2.5 x 1.7 cm, series 2, image 22, coronal image 41 and subdiaphragmatic in the left upper quadrant measuring 2 x 2 x 2.2 cm, series 2, image 15 and coronal image 41. No pelvic sidewall or inguinal lymphadenopathy. Small subcutaneous nodules in the right lower quadrant the largest approximately 6 mm. Aortic atherosclerosis without aneurysm. Reproductive: Uterus and bilateral adnexa are unremarkable. Other: No free air free fluid. Musculoskeletal: Right femoral nail fixation. No apparent lytic or blastic disease. Degenerative disc disease L5-S1. IMPRESSION: 1. There is a masslike abnormality at the left lung base with associated small left pleural effusion, incompletely imaged on this exam but concerning for neoplasm. This measures approximately 7.6 x 9.2 cm in AP by transverse dimension. Tiny 5  mm pleural-based nodular densities also seen in the lingula. Dedicated chest CT may help for further correlation. 2. Bilateral adrenal masses concerning for metastasis, new on the left and increased on the right since prior comparison studies. The left adrenal mass measures 2.8 x 1.8 x 2.4 cm and on the right, 3.2 x 3.2 x 5.3 cm versus 2 cm previously. 3. Soft tissue masses within the mesentery of the upper abdomen consistent with metastatic mesenteric lymphadenopathy, measuring 2.3 x 1.5 x 1.9 cm in the right hemiabdomen and two in the left upper quadrant measuring 2.3 x 2.5 x 1.7 cm and 2 x 2 x 2.2 cm. 4. Aortic atherosclerosis. 5.  Extensive colonic diverticulosis. Electronically Signed   By: Ashley Royalty M.D.   On: 09/17/2016 16:37    1840:   Initial CXR today with LLL airspace disease similar to previous CXR last month, which was thought to be infiltrate (at that time had cough, SOB, leukocytosis, with hx dysphagia and esophageal strictures and concern was aspiration pneumonia); given pt's leukocytosis today, IV abx were ordered. Given pt's hx of abd pain and nausea, CT A/P was obtained today as well as follow up CT chest: both with new dx metastatic cancer. No clear UTI on Udip; UC pending.  Dx and testing d/w pt and family.  Questions answered.  Verb understanding, agreeable to admit. T/C to [facility] Dr. Olevia Bowens, case discussed, including:  HPI, pertinent PM/SHx, VS/PE, dx testing, ED course and treatment:  Agreeable to admit.       Final Clinical Impressions(s) / ED Diagnoses   Final diagnoses:  None    New Prescriptions New Prescriptions   No medications on file      Francine Graven, DO 09/22/16 1840

## 2016-09-17 NOTE — Telephone Encounter (Signed)
Received a fax from Stephen at [facility] today stating that the patient has an appointment in August with [place] for GI symptoms related to scleroderma.  Patient is nausea and unable to eat.  Charleen Kirks, PA wants her seen earlier than that.  I faxed back to Marcie Bal letting her know that he patient does not currently have an appointment scheduled with our office.  She went to the ED today.  We will reach out to schedule the patient an appointment, but it will be with Deberah Castle, NP as we do not have anything with Dr. Laural Golden until the third week of October.  I called the patient's daughter, Arrie Aran and left her a message asking her to call at her convenience and we would be happy to schedule her an appointment with Deberah Castle, NP.

## 2016-09-17 NOTE — ED Notes (Signed)
Patient transported back from X-ray 

## 2016-09-17 NOTE — ED Notes (Signed)
Pt's daughter upset that pt was not getting any IV fluids, she stated that was why pt sent her here for severe dehydration.  I explained to daughter that what was reported to me by [facility] (worsening PNA and request for CT).  Spoke with Dr. Olevia Bowens and is in progress of placing orders for floor to carry out.

## 2016-09-17 NOTE — ED Notes (Signed)
Microwaved frozen entree for patient at this time. Daughter assisting patient to eat.

## 2016-09-17 NOTE — ED Triage Notes (Signed)
Pt denies pain or any complaints, recently dx with PNA.  Pt states that she does not have appetite.

## 2016-09-17 NOTE — Progress Notes (Signed)
Pharmacy Antibiotic Note  Tina [name] is a 83 y.o. female admitted on 09/17/2016 with HCAP pneumonia.  Pharmacy has been consulted for Vancomycin and Aztreonam dosing.  Plan: Vancomycin 1250mg  loading dose, then 500mg  IV every 12 hours.  Goal trough 15-20 mcg/mL.  Aztreonam 2gm Loading dose, then 1gm IV q8h F/U cxs and clinical progress Monitor V/S, labs and levels as indicated  Height: 5\' 7"  (170.2 cm) Weight: 145 lb (65.8 kg) IBW/kg (Calculated) : 61.6  Temp (24hrs), Avg:98.1 F (36.7 C), Min:98.1 F (36.7 C), Max:98.1 F (36.7 C)   Recent Labs Lab 09/17/16 1448 09/17/16 1542  WBC 19.5*  --   CREATININE 0.54  --   LATICACIDVEN  --  1.5    Estimated Creatinine Clearance: 53.6 mL/min (by C-G formula based on SCr of 0.54 mg/dL).     Antimicrobials this admission: Vancomycin 6/11 >>  Aztreonam 6/11 >>   Dose adjustments this admission: N/A  Microbiology results: 6/11 BCx: pending 6/11 UCx: pending  Thank you for allowing pharmacy to be a part of this patient's care.  Tina [name], BS [place], [place] Clinical Pharmacist Pager [phone] 09/17/2016 9:33 PM

## 2016-09-17 NOTE — ED Notes (Signed)
Patient asking for drink at this time,patient given Ginger ale. Daughter is asking if patient will get meal, let her know that I would ask Hospitalist.

## 2016-09-18 ENCOUNTER — Encounter (HOSPITAL_COMMUNITY): Payer: Self-pay | Admitting: Primary Care

## 2016-09-18 DIAGNOSIS — R627 Adult failure to thrive: Secondary | ICD-10-CM

## 2016-09-18 DIAGNOSIS — Z7189 Other specified counseling: Secondary | ICD-10-CM

## 2016-09-18 DIAGNOSIS — R109 Unspecified abdominal pain: Secondary | ICD-10-CM

## 2016-09-18 DIAGNOSIS — R6251 Failure to thrive (child): Secondary | ICD-10-CM

## 2016-09-18 DIAGNOSIS — Z515 Encounter for palliative care: Secondary | ICD-10-CM

## 2016-09-18 LAB — GLUCOSE, CAPILLARY
Glucose-Capillary: 113 mg/dL — ABNORMAL HIGH (ref 65–99)
Glucose-Capillary: 91 mg/dL (ref 65–99)
Glucose-Capillary: 83 mg/dL (ref 65–99)

## 2016-09-18 LAB — BASIC METABOLIC PANEL
Anion gap: 8 (ref 5–15)
BUN: 8 mg/dL (ref 6–20)
CHLORIDE: 97 mmol/L — AB (ref 101–111)
CO2: 26 mmol/L (ref 22–32)
Calcium: 9.5 mg/dL (ref 8.9–10.3)
Creatinine, Ser: 0.56 mg/dL (ref 0.44–1.00)
GFR calc Af Amer: >60 mL/min (ref >60)
GFR calc non Af Amer: >60 mL/min (ref >60)
GLUCOSE: 102 mg/dL — AB (ref 65–99)
POTASSIUM: 3.5 mmol/L (ref 3.5–5.1)
Sodium: 131 mmol/L — ABNORMAL LOW (ref 135–145)

## 2016-09-18 LAB — CBC WITH DIFFERENTIAL/PLATELET
Basophils Absolute: 0.1 10*3/uL (ref 0.0–0.1)
Basophils Relative: 0 %
Eosinophils Absolute: 0.2 10*3/uL (ref 0.0–0.7)
Eosinophils Relative: 1 %
HEMATOCRIT: 29.3 % — AB (ref 36.0–46.0)
HEMOGLOBIN: 9.5 g/dL — AB (ref 12.0–15.0)
LYMPHS ABS: 3.4 10*3/uL (ref 0.7–4.0)
LYMPHS PCT: 19 %
MCH: 32.5 pg (ref 26.0–34.0)
MCHC: 32.4 g/dL (ref 30.0–36.0)
MCV: 100.3 fL — AB (ref 78.0–100.0)
Monocytes Absolute: 1.3 10*3/uL — ABNORMAL HIGH (ref 0.1–1.0)
Monocytes Relative: 7 %
NEUTROS PCT: 73 %
Neutro Abs: 12.8 10*3/uL — ABNORMAL HIGH (ref 1.7–7.7)
Platelets: 532 10*3/uL — ABNORMAL HIGH (ref 150–400)
RBC: 2.92 MIL/uL — AB (ref 3.87–5.11)
RDW: 15.5 % (ref 11.5–15.5)
WBC: 17.7 10*3/uL — AB (ref 4.0–10.5)

## 2016-09-18 LAB — STREP PNEUMONIAE URINARY ANTIGEN: STREP PNEUMO URINARY ANTIGEN: NEGATIVE

## 2016-09-18 MED ORDER — FERROUS SULFATE 325 (65 FE) MG PO TABS
325.0000 mg | ORAL_TABLET | Freq: Every morning | ORAL | Status: DC
  Administered 2016-09-18 – 2016-09-19 (×2): 325 mg via ORAL
  Filled 2016-09-18 (×2): qty 1

## 2016-09-18 NOTE — Care Management Note (Addendum)
Case Management Note  Patient Details  Name: Tina [name] MRN: [id number] Date of Birth: [birth year]  Subjective/Objective:  Adm with weight loss/decreased appetite. Found to have lung mass with mets on CT of chest Oncology/palliative consults made. Family elects to take patient home with hospice services. Would like [facility] for these service. Patient already has oxygen at home and hospital bed. Has no other DME needs.  Family contacts/point person is daughter and son in law, with whom patient lives.   [name],Tina Daughter [phone]    [place] [phone]     Action/Plan: Plans to return home with hospice services. CM will send referral today. Anticipate patient could DC today, 09/18/2016.    Expected Discharge Date:        09/18/2016          Expected Discharge Plan:  Home w Hospice Care  In-House Referral:     Discharge planning Services  CM Consult  Post Acute Care Choice:    Choice offered to:  Adult Children  DME Arranged:    DME Agency:     HH Arranged:    HH Agency:     Status of Service:  Completed, signed off  If discussed at [place] of Stay Meetings, dates discussed:    Additional Comments:  Cartier Washko, Chauncey Reading, RN 09/18/2016, 12:05 PM  [phone]

## 2016-09-18 NOTE — Consult Note (Signed)
This patient was not seen or examined, but chart was reviewed.   Oncology Consultation Note:   Tina [name] 83 y.o. female hospitalized for pneumonia; also with concomitant dementia. CT chest imaging from 09/17/16 reviewed and revealed large lung mass measuring 9.4 cm to inferior LLL with small left pleural effusion. CT abd/pelvis from 09/17/16 reviewed bilateral adrenal and soft tissue mesenteric lymphadenopathy.    No definitive tissue diagnosis to confirm, but clinically appears to have Stage IV metastatic lung cancer.   Discussed patient's case with hospitalist, Dr. Marin Comment via phone. He has had discussion with patient's daughter, who reportedly is a very experienced oncology RN at [facility].  The patient and family have decided to pursue hospice/comfort care instead of further oncologic work-up and treatment consideration.  We certainly think this is appropriate based on patient's reported clinical condition.    Case discussed with Dr. Oliva Bustard, covering medical oncology provider at [facility] today.  We agree with hospitalist and family's plans to be discharged with hospice.    Please feel free to contact us with any further questions or concerns.    Mike Craze, NP [place] [phone]

## 2016-09-18 NOTE — Care Management (Addendum)
SS# [id number]  PCP  Sharilyn Sites   Demographics  Comment    Last edited by  on at   Address: Home Phone: Work Phone: Mobile Phone:  [place]  [place] [address]   [phone] -- [phone]  SSN: Insurance: Marital Status: Religion:  [id number] MEDICARE Widowed [place]  Patient Ethnicity & Race   Ethnic Group Patient Race  Not Hispanic or Latino White or Caucasian  Documents Filed to Patient   Power of Attorney Living Will Clinical Unknown Study Attachment Consent Form ABN Waiver After Visit Summary Lab Result Scan Code Status MyChart Status Advance Care Planning  Not on File Not on File Not on File Not on File Filed Not on File Jani Files DNR [Updated on 09/17/16 2153] Active Jump to the Activity  Admission Information   Attending Provider Admitting Provider Admission Type Admission Date/Time  Orvan Falconer, MD Reubin Milan, MD Emergency 09/17/16 1350  Discharge Date Hospital Service Auth/Cert Status Service Area   Internal Medicine Incomplete [place]  Unit Room/Bed Admission Status   AP-DEPT 300 A308/A308-01 Admission (Confirmed)   Hospital Account   Name Acct ID Class Status Primary Coverage  [place], Sonnen [id number] Inpatient Open MEDICARE - MEDICARE PART A AND B      Guarantor Account (for Hospital Account [id number])   Name Relation to Pt Service Area Active? Acct Type  [name] D Self [facility]SA Yes Personal/Family  Address Phone    [address] [place], [place] [address] [phone])        Coverage Information (for Hospital Account [id number])   1. [place] PART A AND B   F/O Payor/Plan Precert #  MEDICARE/MEDICARE PART A AND B   Subscriber Subscriber #  Essense, Bousquet [id number]  Address Phone  [address] [place], [place] [address]   2. AARP/AARP   F/O Payor/Plan Precert #  AARP/AARP   Subscriber Subscriber #  Glennda, Weatherholtz [id number]  Address Phone  [address] [place] [address]

## 2016-09-18 NOTE — Progress Notes (Signed)
PROGRESS NOTE    Tina [name]  UUV:[id number] DOB: 08/25/1933 DOA: 09/17/2016 PCP: Sharilyn Sites, MD    Brief Narrative: 83 yo with dementia, recently Tx for PNA, readmitted and started Tx with IV antibiotics, and CT showed large metastatic lung mass of 9cm on the left lobe.  Its suspicious for primary bronchogenic carcinoma.  I spoke with her daughter who has been an oncology RN at [facility] for close to 40 years, and with patient, and their desire is to provide her with hospice care, given her disease status, and her current concomitant diagnosis of dementia.     Assessment & Plan:   Principal Problem:   Mass of lower lobe of left lung Active Problems:   Hypertension   High cholesterol   COPD (chronic obstructive pulmonary disease) (HCC)   Barrett's esophagus   Type 2 diabetes mellitus (HCC)   Depression   HCAP (healthcare-associated pneumonia)   Sleep apnea   Anemia   1.  Metastatic lung CA:  Have consulted palliative care for hospice care.  D/C antibiotics, no need for oncology consultation.  Decision for this palliative care is very decisive on family and patient's part.    DVT prophylaxis: Heparin SQ. Code Status: Hospice and Palliative care.  DNR.  Family Communication: daughter at bedside, experienced oncology nurse at [facility].  Disposition Plan:  TBD.   Consultants:   Palliative care.   Procedures:  None.   Antimicrobials: Anti-infectives    Start     Dose/Rate Route Frequency Ordered Stop   09/18/16 0500  vancomycin (VANCOCIN) 500 mg in sodium chloride 0.9 % 100 mL IVPB  Status:  Discontinued     500 mg 100 mL/hr over 60 Minutes Intravenous Every 12 hours 09/17/16 2142 09/18/16 0903   09/17/16 2200  aztreonam (AZACTAM) 2 g in dextrose 5 % 50 mL IVPB  Status:  Discontinued     2 g 100 mL/hr over 30 Minutes Intravenous Every 8 hours 09/17/16 2153 09/17/16 2200   09/17/16 2200  aztreonam (AZACTAM) 1 g in dextrose 5 % 50 mL IVPB  Status:  Discontinued      1 g 100 mL/hr over 30 Minutes Intravenous Every 8 hours 09/17/16 2142 09/18/16 0903   09/17/16 1600  vancomycin (VANCOCIN) 1,250 mg in sodium chloride 0.9 % 250 mL IVPB     1,250 mg 166.7 mL/hr over 90 Minutes Intravenous  Once 09/17/16 1546 09/17/16 1904   09/17/16 1545  aztreonam (AZACTAM) 2 g in dextrose 5 % 50 mL IVPB     2 g 100 mL/hr over 30 Minutes Intravenous  Once 09/17/16 1537 09/17/16 1703   09/17/16 1545  vancomycin (VANCOCIN) IVPB 1000 mg/200 mL premix  Status:  Discontinued     1,000 mg 200 mL/hr over 60 Minutes Intravenous  Once 09/17/16 1537 09/17/16 1546       Subjective:  No complaints.   Objective: Vitals:   09/17/16 2154 09/17/16 2339 09/18/16 0434 09/18/16 0746  BP:   116/70   Pulse:   95   Resp:   18   Temp:   97.8 F (36.6 C)   TempSrc:   Oral   SpO2: 95% (!) 87% 96% 96%  Weight:      Height:        Intake/Output Summary (Last 24 hours) at 09/18/16 0904 Last data filed at 09/18/16 0857  Gross per 24 hour  Intake           799.17 ml  Output  900 ml  Net          -100.83 ml   Filed Weights   09/17/16 1355 09/17/16 2153  Weight: 65.8 kg (145 lb) 64.3 kg (141 lb 12.1 oz)    Examination:  General exam: Appears calm and comfortable  Respiratory system: scattered rhonchi.  Cardiovascular system: S1 & S2 heard, RRR. No JVD, murmurs, rubs, gallops or clicks. No pedal edema. Gastrointestinal system: Abdomen is nondistended, soft and nontender. No organomegaly or masses felt. Normal bowel sounds heard. Central nervous system: Alert and oriented. No focal neurological deficits. Extremities: Symmetric 5 x 5 power. Skin: No rashes, lesions or ulcers Psychiatry: Judgement and insight appear normal. Mood & affect appropriate.   Data Reviewed: I have personally reviewed following labs and imaging studies  CBC:  Recent Labs Lab 09/17/16 1448 09/18/16 0448  WBC 19.5* 17.7*  NEUTROABS 14.7* 12.8*  HGB 9.9* 9.5*  HCT 31.0* 29.3*  MCV  100.6* 100.3*  PLT 522* 528*   Basic Metabolic Panel:  Recent Labs Lab 09/17/16 1448 09/18/16 0448  NA 133* 131*  K 3.5 3.5  CL 96* 97*  CO2 28 26  GLUCOSE 92 102*  BUN 9 8  CREATININE 0.54 0.56  CALCIUM 9.9 9.5   GFR: Estimated Creatinine Clearance: 53.6 mL/min (by C-G formula based on SCr of 0.56 mg/dL). Liver Function Tests:  Recent Labs Lab 09/17/16 1448  AST 14*  ALT 9*  ALKPHOS 75  BILITOT 0.7  PROT 7.0  ALBUMIN 3.1*    Recent Labs Lab 09/17/16 1448  LIPASE 30   Cardiac Enzymes:  Recent Labs Lab 09/17/16 1448  TROPONINI <0.03   CBG:  Recent Labs Lab 09/17/16 2248  GLUCAP 106*    Sepsis Labs:  Recent Labs Lab 09/17/16 1542  LATICACIDVEN 1.5    No results found for this or any previous visit (from the past 240 hour(s)).   Radiology Studies: Dg Chest 2 View  Result Date: 09/17/2016 CLINICAL DATA:  Abdominal, weight loss EXAM: CHEST  2 VIEW COMPARISON:  09/04/2016 FINDINGS: Postsurgical changes of the right lung with calcified pleural plaque. Small left pleural effusion. Left lower lobe airspace disease. No pneumothorax. Stable cardiomediastinal silhouette. No acute osseous abnormality. IMPRESSION: 1. Small left pleural effusion. Left lower lobe airspace disease concerning for pneumonia. Followup PA and lateral chest X-ray is recommended in 3-4 weeks following trial of antibiotic therapy to ensure resolution and exclude underlying malignancy. Electronically Signed   By: Kathreen Devoid   On: 09/17/2016 14:43   Ct Chest Wo Contrast  Result Date: 09/17/2016 CLINICAL DATA:  Masslike opacity left lower lobe on abdomen CT earlier today. EXAM: CT CHEST WITHOUT CONTRAST TECHNIQUE: Multidetector CT imaging of the chest was performed following the standard protocol without IV contrast. COMPARISON:  Abdomen CT 09/17/2016.  Chest CT 09/05/2015. FINDINGS: Cardiovascular: Heart size normal. No substantial pericardial effusion. Coronary artery calcification is  noted. Atherosclerotic calcification is noted in the wall of the thoracic aorta. Mediastinum/Nodes: 2.4 cm lymph node identified in the left hilum 1.4 cm short axis AP window lymph node. 2.1 cm lymph node identified in the inferior left hilum. Lungs/Pleura: Emphysema again noted with biapical pleural-parenchymal scarring. Small left pleural effusion evident. 2.9 cm nodular opacity associated with the left major fissure may be loculated fluid or pleural disease. Large 9.4 x 9.3 cm mass lesion identified largely replacing the inferior left lower lobe. Calcified pleural plaque identified anterior right hemithorax. Upper Abdomen: Bilateral adrenal lesions approaching 3 cm and better characterized on  the recent CT. Omental nodules are again noted in the region of the splenic flexure. Musculoskeletal: Bone windows reveal no worrisome lytic or sclerotic osseous lesions. IMPRESSION: 1. Large left lower lobe mass with left hilar and mediastinal lymphadenopathy. Imaging features are consistent with primary bronchogenic neoplasm. 2. Small left pleural effusion. 3. Probable bilateral adrenal and splenic omental metastases. Emphysema. ([id number]-U31.9) Abdominal Aortic Atherosclerois (ICD10-170.0) Electronically Signed   By: Misty Stanley M.D.   On: 09/17/2016 17:58   Ct Abdomen Pelvis W Contrast  Result Date: 09/17/2016 CLINICAL DATA:  Abdominal pain, nausea and unintentional weight loss. Diabetes. EXAM: CT ABDOMEN AND PELVIS WITH CONTRAST TECHNIQUE: Multidetector CT imaging of the abdomen and pelvis was performed using the standard protocol following bolus administration of intravenous contrast. CONTRAST:  151mL ISOVUE-300 IOPAMIDOL (ISOVUE-300) INJECTION 61% COMPARISON:  08/04/2010 chest CT, chest CT 06/12/2004 09/17/2016 CXR FINDINGS: Lower chest: Masslike abnormality at the left lung base, partially included with associated small pleural effusion and areas of heterogeneity within likely representing areas of necrosis. This  measures 7.6 x 9.2 cm in AP by transverse dimension. The cephalad margin is not entirely excluded. Findings are concerning for neoplasm and possible adjacent left hilar adenopathy. Dedicated chest CT may help for further evaluation of the extent of this abnormality. Tiny 5 mm nodular density abutting the pleura within the lingula is also seen. The right lung demonstrates no acute abnormality. Heart is normal in size. There is debris seen within the distal esophagus. Hepatobiliary: No focal liver abnormality is seen. Status post cholecystectomy. No biliary dilatation. Pancreas: Unremarkable. No pancreatic ductal dilatation or surrounding inflammatory changes. Spleen: Normal in size without focal abnormality. Adrenals/Urinary Tract: New left adrenal mass, measuring 2.8 x 1.8 x 2.4 cm with interval increase in size of heterogeneously enhancing right adrenal mass measuring 3.2 x 3.2 x 5.3 cm versus 2 cm in 2012 and 2006. Stomach/Bowel: Contracted stomach with normal small bowel rotation. No fold or mural thickening is noted of small intestine. There is diffuse colonic diverticulosis without acute diverticulitis, more severely affecting the sigmoid colon with circular muscle hypertrophy noted. Normal appendix. Vascular/Lymphatic: Bilateral upper abdominal mesenteric masses with enhancement, in the right hemiabdomen, series 2 image 40 and coronal image 23 measuring 2.3 x 1.5 x 1.9 cm pain in the left upper quadrant measuring 2.3 x 2.5 x 1.7 cm, series 2, image 22, coronal image 41 and subdiaphragmatic in the left upper quadrant measuring 2 x 2 x 2.2 cm, series 2, image 15 and coronal image 41. No pelvic sidewall or inguinal lymphadenopathy. Small subcutaneous nodules in the right lower quadrant the largest approximately 6 mm. Aortic atherosclerosis without aneurysm. Reproductive: Uterus and bilateral adnexa are unremarkable. Other: No free air free fluid. Musculoskeletal: Right femoral nail fixation. No apparent lytic or  blastic disease. Degenerative disc disease L5-S1. IMPRESSION: 1. There is a masslike abnormality at the left lung base with associated small left pleural effusion, incompletely imaged on this exam but concerning for neoplasm. This measures approximately 7.6 x 9.2 cm in AP by transverse dimension. Tiny 5 mm pleural-based nodular densities also seen in the lingula. Dedicated chest CT may help for further correlation. 2. Bilateral adrenal masses concerning for metastasis, new on the left and increased on the right since prior comparison studies. The left adrenal mass measures 2.8 x 1.8 x 2.4 cm and on the right, 3.2 x 3.2 x 5.3 cm versus 2 cm previously. 3. Soft tissue masses within the mesentery of the upper abdomen consistent with metastatic mesenteric lymphadenopathy,  measuring 2.3 x 1.5 x 1.9 cm in the right hemiabdomen and two in the left upper quadrant measuring 2.3 x 2.5 x 1.7 cm and 2 x 2 x 2.2 cm. 4. Aortic atherosclerosis. 5. Extensive colonic diverticulosis. Electronically Signed   By: Ashley Royalty M.D.   On: 09/17/2016 16:37    Scheduled Meds: . donepezil  10 mg Oral QHS  . escitalopram  10 mg Oral Daily  . feeding supplement (ENSURE ENLIVE)  237 mL Oral BID BM  . ferrous sulfate  325 mg Oral q morning - 10a  . heparin  5,000 Units Subcutaneous Q8H  . ipratropium-albuterol  3 mL Nebulization Q6H  . metoprolol tartrate  25 mg Oral BID  . pantoprazole  40 mg Oral BID   Continuous Infusions: . 0.9 % NaCl with KCl 20 mEq / L 50 mL/hr at 09/17/16 2238     LOS: 1 day   Arionna Hoggard, MD FACP Hospitalist.   If 7PM-7AM, please contact night-coverage [email or web address] Password [facility] 09/18/2016, 9:04 AM

## 2016-09-18 NOTE — Care Management (Signed)
CM spoke with [facility] hospice, they did receive referral. Anticipate DC tomorrow. [facility] aware, will try again to contact [place], patient's daughter. CM will follow up tomorrow.

## 2016-09-18 NOTE — Consult Note (Signed)
Consultation Note Date: 09/18/2016   Patient Name: Tina [name]  DOB: [birth year]  MRN: [id number]  Age / Sex: 83 y.o., female  PCP: Sharilyn Sites, MD Referring Physician: Orvan Falconer, MD  Reason for Consultation: Establishing goals of care and Hospice Evaluation  HPI/Patient Profile: 83 y.o. female  with past medical history of Barrett's esophagus, COPD, dementia, high blood pressure, irritable bowel with diarrhea, type II diabetes, gout, gastritis, H. pylori, scleroderma causing hand deformities admitted on 09/17/2016 with healthcare associated pneumonia in conjunction with massive the lower lobe of left lung.   Clinical Assessment and Goals of Care: Mrs. Blakley is sitting up in her [place] chair. She greets me, making and keeping eye contact as I enter. She appears frail, but is calm and cooperative. She has no complaints or concerns at this time. Present today at bedside is daughter Doreene Adas, Therapist, sports. Mrs. Gwinn agrees for me to speak with her daughter.   We talk about Mrs. Sahakian's home life. She was a widow at age 55. Langley Gauss bought the home Mrs. Hommes now lives in many years ago. Daughter and son-in-law, Arrie Aran and Darl Householder moved in 6 years ago after Mrs. Woodstock's 2nd husband died. Mrs. Traister has had a hospital bed, potty chair, and rolling walker for quite some time now. 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   Langley Gauss and I talk about in-home hospice services. Mrs. Dumas states her preferences to die at home, Langley Gauss agrees. She shares that this may be difficult for [name] who lives in the home. They are requesting hospice of [facility]. Langley Gauss states that her sister Arrie Aran is not doing well with Mrs. Meulemans's frailty and decline. Langley Gauss states that Arrie Aran is very focused on getting Mrs. Stadler to eat. I share that Mrs. Marconi may not be able to improve her appetite,  Langley Gauss agrees.  She goes further to state that even just riding in a car recently wore her mother out, she sees that she is much weaker.  We talk privately about prognosis with [name]'s permission. Langley Gauss states she feels that her mother has only "weeks". I share that 3 months or less would not be surprising. We talk about normal and expected changes that occur as people near end of life. Langley Gauss gets a call from her sister. We set up a family meeting time for 2 PM today.  I returned later in the day for a family meeting. Mrs. Garr is sitting in her [place] chair, smiling and talking with a dear friend, Inez Catalina.  Mrs. Dusing asks when she will be able to go home. I share that I will talk with Dr. Marin Comment, but that she will likely be able to return home tomorrow. She smiles and tells me she's pleased.  Daughters Doreene Adas and Reatha Harps and I go to my office for a family meeting. We talk about Mrs. Baines chronic and acute health problems. We talk about her advanced cancer. We talk about prognosis, with permission and  what this time may look like for their mother. Family is requesting hospice of [facility] for in-home services, comfort and dignity at end-of-life.  [name] has questions about nutrition and hydration. I share the booklet "Hard choices for loving people". We talk about what is normal and expected at end-of-life, I share that hospice will not provide IV fluids at home or in a facility. I share that IV fluids at end-of-life can cause more discomfort. We talk about allowing Mrs. Hartfield to eat or drink as she will. I share with [name] that there may come a time, soon, that Mrs. Paullin will only drink, and not eat. I share that they can continue antibiotics at home if they don't provide discomfort/nausea for Mrs. Parzych, continue with current treatment plan. I encourage leaning on hospice providers for more information. I share a diagram comparing home health versus hospice services.  We talk about the  concept of allowing natural death. Langley Gauss shares that Mrs. Vicario has stated her preferences to die at home. We talk about symptom management at home, and that it may be that Mrs. Edberg needs transfer to hospice home at the end.    Healthcare power of attorney HCPOA - daughter Doreene Adas, RN is legal healthcare power of attorney. Daughter Reatha Harps who lives in the home is durable power of attorney.   SUMMARY OF RECOMMENDATIONS   family is requesting in-home hospice services be provided by hospice of [facility].  Code Status/Advance Care Planning:  DNR  Symptom Management:   Ultram 50 mg PO Q6 hours PRN added  Palliative Prophylaxis:   Aspiration, Frequent Pain Assessment and Turn Reposition  Additional Recommendations (Limitations, Scope, Preferences):  Home with benefits of hospice of [facility], continue with current medication regimen as Mrs. Wale tolerates.  Psycho-social/Spiritual:   Desire for further Chaplaincy support:no  Additional Recommendations: Caregiving  Support/Resources  Prognosis:   < 3 months, or less would not be surprising based on 20 pound weight loss over the last few weeks, frailty, functional decline, aggressive lung cancer with metastatic burden, patient and family desire to focus on comfort not cure.  Discharge Planning: Home with the benefits of hospice of [facility] for death with dignity      Primary Diagnoses: Present on Admission: . Mass of lower lobe of left lung . Hypertension . High cholesterol . Depression . COPD (chronic obstructive pulmonary disease) ([place]) . HCAP (healthcare-associated pneumonia) . Barrett's esophagus . Sleep apnea . Anemia   I have reviewed the medical record, interviewed the patient and family, and examined the patient. The following aspects are pertinent.  Past Medical History:  Diagnosis Date  . Barrett's esophagus   . Chronic diarrhea   . COPD (chronic obstructive  pulmonary disease) ([place])   . Dementia   . Essential hypertension, benign   . Gastritis   . Gout   . Helicobacter pylori (H. pylori)   . Irritable bowel syndrome   . Lower abdominal pain   . Pneumonia   . Raynaud's disease   . 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 Scleroderma ([place])   . Sleep apnea    Not on CPAP  . Stroke ([place])   . Type 2 diabetes mellitus ([place])    Social History   Social History  . Marital status: Widowed    Spouse name: N/A  . Number of children: N/A  . Years of education: N/A   Social History Main Topics  . Smoking status: Former Smoker    Types:  Cigarettes  . Smokeless tobacco: Never Used  . Alcohol use No  . Drug use: No  . Sexual activity: Yes    Birth control/ protection: Post-menopausal   Other Topics Concern  . None   Social History Narrative  . None   Family History  Problem Relation Age of Onset  . Asthma Father   . Melanoma Brother    Scheduled Meds: . donepezil  10 mg Oral QHS  . escitalopram  10 mg Oral Daily  . feeding supplement (ENSURE ENLIVE)  237 mL Oral BID BM  . ferrous sulfate  325 mg Oral q morning - 10a  . heparin  5,000 Units Subcutaneous Q8H  . ipratropium-albuterol  3 mL Nebulization Q6H  . metoprolol tartrate  25 mg Oral BID  . pantoprazole  40 mg Oral BID   Continuous Infusions: . 0.9 % NaCl with KCl 20 mEq / L 50 mL/hr at 09/17/16 2238   PRN Meds:.albuterol, ALPRAZolam, metoCLOPramide, ondansetron **OR** ondansetron (ZOFRAN) IV, traMADol Medications Prior to Admission:  Prior to Admission medications   Medication Sig Start Date End Date Taking? Authorizing Provider  acetaminophen (TYLENOL) 500 MG tablet Take 500 mg by mouth 2 (two) times daily.   Yes [provider]  ALPRAZolam (XANAX) 0.25 MG tablet Take 1 tablet (0.25 mg total) by mouth 3 (three) times daily as needed for anxiety. Patient taking differently: Take 0.25 mg by mouth 3 (three) times daily as needed for anxiety or sleep.  11/06/13  Yes Black,  Lezlie Octave, NP  Cholecalciferol (VITAMIN D PO) Take 1 tablet by mouth every morning.   Yes [provider]  clindamycin (CLEOCIN) 150 MG capsule Take 1 capsule by mouth 4 (four) times daily. 09/12/16  Yes [provider]  donepezil (ARICEPT) 10 MG tablet Take 10 mg by mouth at bedtime.   Yes [provider]  escitalopram (LEXAPRO) 10 MG tablet Take 10 mg by mouth daily.   Yes [provider]  ferrous sulfate 325 (65 FE) MG tablet Take 325 mg by mouth every morning.   Yes [provider]  Glycopyrrolate-Formoterol (BEVESPI AEROSPHERE) 9-4.8 MCG/ACT AERO Inhale 2 puffs into the lungs 2 (two) times daily.   Yes [provider]  lisinopril (PRINIVIL,ZESTRIL) 5 MG tablet Take 5 mg by mouth daily.   Yes [provider]  Menthol, Topical Analgesic, 154 MG PADS Apply 1 each topically daily as needed (for arthritis pain to right arm-shoulder).   Yes [provider]  metoCLOPramide (REGLAN) 5 MG tablet Take 5 mg by mouth 4 (four) times daily as needed for nausea or vomiting.  09/14/16  Yes [provider]  metoprolol tartrate (LOPRESSOR) 25 MG tablet Take 25 mg by mouth 2 (two) times daily.    Yes [provider]  pantoprazole (PROTONIX) 40 MG tablet Take 1 tablet (40 mg total) by mouth 2 (two) times daily. 11/04/15  Yes Isaac Bliss, Rayford Halsted, MD  SPIRIVA HANDIHALER 18 MCG inhalation capsule USE 1 CAPSULE VIA HANDIHALER ONCE DAILY 07/20/15  Yes [provider]  traMADol (ULTRAM) 50 MG tablet TAKE 1 TABLET BY MOUTH EVERY 6 HOURS AS NEEDED 02/27/16  Yes Carole Civil, MD  VENTOLIN HFA 108 (90 Base) MCG/ACT inhaler Inhale 2 puffs into the lungs every 4 (four) hours as needed for wheezing or shortness of breath.  09/12/16  Yes [provider]  vitamin B-12 (CYANOCOBALAMIN) 250 MCG tablet Take 250 mcg by mouth daily.    Yes [provider]  vitamin E 400 UNIT capsule Take 400 Units by mouth daily.    Yes [provider]    Review of Systems  Unable to perform ROS: Age    Physical Exam  Constitutional: No distress.  Frail and thin, makes and keeps eye contact, calm and cooperative  HENT:  Head: Atraumatic.  Cardiovascular: Normal rate and regular rhythm.   Pulmonary/Chest: Effort normal. No respiratory distress.  Abdominal: Soft. She exhibits no distension.  Neurological: She is alert.  known dementia  Skin: Skin is warm and dry.  Nursing note and vitals reviewed.   Vital Signs: BP 116/70 (BP Location: Right Arm)   Pulse 95   Temp 97.8 F (36.6 C) (Oral)   Resp 18   Ht 5\' 7"  (1.702 m)   Wt 64.3 kg (141 lb 12.1 oz)   SpO2 96%   BMI 22.20 kg/m  Pain Assessment: No/denies pain   Pain Score: 0-No pain   SpO2: SpO2: 96 % O2 Device:SpO2: 96 % O2 Flow Rate: .O2 Flow Rate (L/min): 2 L/min  IO: Intake/output summary:  Intake/Output Summary (Last 24 hours) at 09/18/16 1215 Last data filed at 09/18/16 0857  Gross per 24 hour  Intake           799.17 ml  Output              900 ml  Net          -100.83 ml    LBM: Last BM Date: 09/17/16 Baseline Weight: Weight: 65.8 kg (145 lb) Most recent weight: Weight: 64.3 kg (141 lb 12.1 oz)     Palliative Assessment/Data:   Flowsheet Rows     Most Recent Value  Intake Tab  Referral Department  Hospitalist  Unit at Time of Referral  Med/Surg Unit  Palliative Care Primary Diagnosis  Cancer  Date Notified  09/18/16  Palliative Care Type  New Palliative care  Reason for referral  Clarify Goals of Care  Date of Admission  09/17/16  Date first seen by Palliative Care  09/18/16  # of days Palliative referral response time  0 Day(s)  # of days IP prior to Palliative referral  1  Clinical Assessment  Palliative Performance Scale Score  40%  Pain Max last 24 hours  Not able to report  Pain Min Last 24 hours  Not able to report  Dyspnea Max Last 24 Hours  Not able to report  Dyspnea Min Last 24 hours  Not able to  report  Psychosocial & Spiritual Assessment  Palliative Care Outcomes  Patient/Family meeting held?  Yes  Who was at the meeting?  Patient and daughter, Doreene Adas, RN  Patient/Family wishes: Interventions discontinued/not started   Mechanical Ventilation  Palliative Care follow-up planned  -- [facility] in home services]      Time In:      1120  and  1400 Time Out:   1200  and  1500 Time Total:   40 + 60   =    100 minutes Total   Greater than 50%  of this time was spent counseling and coordinating care related to the above assessment and plan.  Signed by: Drue Novel, NP   Please contact Palliative Medicine Team phone at [phone] for questions and concerns.  For individual provider: See Shea Evans

## 2016-09-18 NOTE — Progress Notes (Signed)
Nutrition Brief Note  Chart reviewed. The pt is an 83 yo with metastatic lung cancer and a hx of dementia. She presents with decreased appetite and wt loss. She has decided to pursue Hospice care.  No further nutrition interventions warranted at this time.    Colman Cater MS,RD,CSG,LDN Office: [phone] Pager: [phone]

## 2016-09-18 NOTE — Consult Note (Signed)
   [facility] CM Inpatient Consult   09/18/2016  Tina [name] [birth year] [id number]   Patient screened for potential [place] Management services. Patient is eligible for [place]. Electronic medical record reveals patient's discharge plan is hospice care. [facility] Care Management services not appropriate at this time. If patient's post hospital needs change please place a [facility] Care Management consult. For questions please contact:   Camdynn Maranto RN, [facility] Liaison  [phone]) Business Mobile [phone]) Toll free office

## 2016-09-19 DIAGNOSIS — R918 Other nonspecific abnormal finding of lung field: Secondary | ICD-10-CM

## 2016-09-19 LAB — CBC WITH DIFFERENTIAL/PLATELET
Basophils Absolute: 0.1 10*3/uL (ref 0.0–0.1)
Basophils Relative: 1 %
EOS ABS: 0.2 10*3/uL (ref 0.0–0.7)
EOS PCT: 1 %
HCT: 29.2 % — ABNORMAL LOW (ref 36.0–46.0)
Hemoglobin: 9.3 g/dL — ABNORMAL LOW (ref 12.0–15.0)
LYMPHS ABS: 2.6 10*3/uL (ref 0.7–4.0)
Lymphocytes Relative: 17 %
MCH: 32.2 pg (ref 26.0–34.0)
MCHC: 31.8 g/dL (ref 30.0–36.0)
MCV: 101 fL — ABNORMAL HIGH (ref 78.0–100.0)
MONOS PCT: 7 %
Monocytes Absolute: 1.1 10*3/uL — ABNORMAL HIGH (ref 0.1–1.0)
Neutro Abs: 11.4 10*3/uL — ABNORMAL HIGH (ref 1.7–7.7)
Neutrophils Relative %: 74 %
PLATELETS: 496 10*3/uL — AB (ref 150–400)
RBC: 2.89 MIL/uL — ABNORMAL LOW (ref 3.87–5.11)
RDW: 15.4 % (ref 11.5–15.5)
WBC: 15.3 10*3/uL — AB (ref 4.0–10.5)

## 2016-09-19 LAB — BASIC METABOLIC PANEL
Anion gap: 8 (ref 5–15)
BUN: 8 mg/dL (ref 6–20)
CO2: 27 mmol/L (ref 22–32)
CREATININE: 0.59 mg/dL (ref 0.44–1.00)
Calcium: 9.6 mg/dL (ref 8.9–10.3)
Chloride: 96 mmol/L — ABNORMAL LOW (ref 101–111)
GFR calc Af Amer: >60 mL/min (ref >60)
Glucose, Bld: 92 mg/dL (ref 65–99)
Potassium: 3.8 mmol/L (ref 3.5–5.1)
SODIUM: 131 mmol/L — AB (ref 135–145)

## 2016-09-19 LAB — GLUCOSE, CAPILLARY
GLUCOSE-CAPILLARY: 84 mg/dL (ref 65–99)
Glucose-Capillary: 105 mg/dL — ABNORMAL HIGH (ref 65–99)

## 2016-09-19 MED ORDER — IPRATROPIUM-ALBUTEROL 0.5-2.5 (3) MG/3ML IN SOLN: 3.0000 mL | Freq: Three times a day (TID) | RESPIRATORY_TRACT | Status: DC

## 2016-09-19 MED ORDER — MORPHINE SULFATE (CONCENTRATE) 10 MG /0.5 ML PO SOLN: 2.5000 mg | ORAL | 0 refills | Status: AC | PRN

## 2016-09-19 NOTE — Care Management Important Message (Signed)
Important Message  Patient Details  Name: TANYIKA BARROS MRN: [id number] Date of Birth: 03-11-1934   Medicare Important Message Given:  Yes    Sevyn Markham, Chauncey Reading, RN 09/19/2016, 1:58 PM

## 2016-09-19 NOTE — Progress Notes (Signed)
Daily Progress Note   Patient Name: Tina [name]       Date: 09/19/2016 DOB: December 29, 1933  Age: 83 y.o. MRN#: [id number] Attending Physician: No att. providers found Primary Care Physician: Sharilyn Sites, MD Admit Date: 09/17/2016  Reason for Consultation/Follow-up: Establishing goals of care, Hospice Evaluation and Psychosocial/spiritual support  Subjective: Tina [name] is sitting quietly, her daughter Reatha Harps at her side. She greets me making and keeping eye contact. Again no complaints today. We talk about going home and having support with in-home services. Tina [name] states that she appreciates the help and support and understanding where her mother is with her illness. I encouraged Tina [name] to call it any point for questions or concerns.  Length of Stay: 2  Current Medications: Scheduled Meds:  . donepezil  10 mg Oral QHS  . escitalopram  10 mg Oral Daily  . feeding supplement (ENSURE ENLIVE)  237 mL Oral BID BM  . ferrous sulfate  325 mg Oral q morning - 10a  . heparin  5,000 Units Subcutaneous Q8H  . ipratropium-albuterol  3 mL Nebulization TID  . metoprolol tartrate  25 mg Oral BID  . pantoprazole  40 mg Oral BID    Continuous Infusions:   PRN Meds: albuterol, ALPRAZolam, metoCLOPramide, ondansetron **OR** ondansetron (ZOFRAN) IV, traMADol  Physical Exam  Constitutional: No distress.  HENT:  Head: Atraumatic.  Cardiovascular: Normal rate and regular rhythm.   Pulmonary/Chest: Effort normal. No respiratory distress.  Abdominal: Soft. She exhibits no distension.  Musculoskeletal: She exhibits no edema.  Neurological: She is alert.  Known dementia  Skin: Skin is warm and dry.  Nursing note and vitals reviewed.           Vital Signs: BP (!) 138/50   Pulse 88   Temp  98.4 F (36.9 C) (Oral)   Resp 20   Ht 5\' 7"  (1.702 m)   Wt 64.3 kg (141 lb 12.1 oz)   SpO2 94%   BMI 22.20 kg/m  SpO2: SpO2: 94 % O2 Device: O2 Device: Nasal Cannula O2 Flow Rate: O2 Flow Rate (L/min): 2 L/min  Intake/output summary:  Intake/Output Summary (Last 24 hours) at 09/19/16 1455 Last data filed at 09/19/16 0915  Gross per 24 hour  Intake           869.17 ml  Output  0 ml  Net           869.17 ml   LBM: Last BM Date: 09/17/16 Baseline Weight: Weight: 65.8 kg (145 lb) Most recent weight: Weight: 64.3 kg (141 lb 12.1 oz)       Palliative Assessment/Data:    Flowsheet Rows     Most Recent Value  Intake Tab  Referral Department  Hospitalist  Unit at Time of Referral  Med/Surg Unit  Palliative Care Primary Diagnosis  Cancer  Date Notified  09/18/16  Palliative Care Type  New Palliative care  Reason for referral  Clarify Goals of Care  Date of Admission  09/17/16  Date first seen by Palliative Care  09/18/16  # of days Palliative referral response time  0 Day(s)  # of days IP prior to Palliative referral  1  Clinical Assessment  Palliative Performance Scale Score  40%  Pain Max last 24 hours  Not able to report  Pain Min Last 24 hours  Not able to report  Dyspnea Max Last 24 Hours  Not able to report  Dyspnea Min Last 24 hours  Not able to report  Psychosocial & Spiritual Assessment  Palliative Care Outcomes  Patient/Family meeting held?  Yes  Who was at the meeting?  Patient and daughter, Doreene Adas, RN  Patient/Family wishes: Interventions discontinued/not started   Mechanical Ventilation  Palliative Care follow-up planned  -- [facility] in home services]      Patient Active Problem List   Diagnosis Date Noted  . Failure to thrive (0-17)   . Goals of care, counseling/discussion   . Palliative care encounter   . Encounter for hospice care discussion   . Mass of lower lobe of left lung 09/17/2016  . HCAP  (healthcare-associated pneumonia) 09/17/2016  . Sleep apnea 09/17/2016  . Anemia 09/17/2016  . Depression 09/04/2016  . Scleroderma ([place]) 09/04/2016  . Iron deficiency anemia 09/04/2016  . Hyponatremia 09/04/2016  . Nausea & vomiting 09/04/2016  . Upper GI bleeding 11/02/2015  . UGIB (upper gastrointestinal bleed) 11/02/2015  . Pneumonia 09/05/2015  . CAP (community acquired pneumonia) 09/05/2015  . Intertrochanteric fracture of right femur ([place]) 11/04/2013  . Preoperative cardiovascular examination 11/03/2013  . Fall at home 11/02/2013  . Hip fracture, right ([place]) 11/02/2013  . Closed right hip fracture ([place]) 11/02/2013  . Chest pain 05/05/2013  . Type 2 diabetes mellitus ([place]) 05/05/2013  . Hypertension 08/01/2011  . High cholesterol 08/01/2011  . Gout 08/01/2011  . Raynaud's disease 08/01/2011  . COPD (chronic obstructive pulmonary disease) ([place]) 08/01/2011  . Barrett's esophagus 08/01/2011  . Esophageal dysphagia 08/01/2011    Palliative Care Assessment & Plan   Patient Profile: 83 y.o. female  with past medical history of Barrett's esophagus, COPD, dementia, high blood pressure, irritable bowel with diarrhea, type II diabetes, gout, gastritis, H. pylori, scleroderma causing hand deformities admitted on 09/17/2016 with healthcare associated pneumonia in conjunction with massive the lower lobe of left lung.   Assessment: healthcare associated pneumonia in conjunction with mass of the lower lobe of left lung: No further workup/diagnostics. Home with the benefit of hospice of [facility]. Continue PO antibiotics as long as Tina [name] is agreeable, side effects are not too severe.  Recommendations/Plan:  continuous PO antibiotics as long as Tina [name] is agreeable and side effects are not too severe. Home with the benefits of hospice of [facility]. We talk about do not hospitalize.  Goals of Care and Additional Recommendations:  Limitations on Scope of Treatment:  Treat the treatable but no CPR or intubation. Home with hospice  Code Status:    Code Status Orders        Start     Ordered   09/17/16 2154  Do not attempt resuscitation (DNR)  Continuous    Question Answer Comment  In the event of cardiac or respiratory ARREST Do not call a "code blue"   In the event of cardiac or respiratory ARREST Do not perform Intubation, CPR, defibrillation or ACLS   In the event of cardiac or respiratory ARREST Use medication by any route, position, wound care, and other measures to relive pain and suffering. May use oxygen, suction and manual treatment of airway obstruction as needed for comfort.      09/17/16 2153    Code Status History    Date Active Date Inactive Code Status Order ID Comments User Context   09/04/2016  7:24 PM 09/04/2016  7:24 PM DNR [id number]  Vianne Bulls, MD ED   09/04/2016  7:24 PM 09/07/2016  9:57 PM DNR [id number]  Vianne Bulls, MD ED   11/02/2015  1:34 AM 11/04/2015  4:00 PM DNR [id number]  Orvan Falconer, MD Inpatient   11/02/2015 12:40 AM 11/02/2015  1:34 AM DNR [id number]  Orvan Falconer, MD ED   09/06/2015 12:58 AM 09/08/2015  2:20 PM DNR [id number]  Orvan Falconer, MD Inpatient   11/04/2013  4:14 PM 11/06/2013  7:17 PM Full Code [id number]  Carole Civil, MD Inpatient   11/02/2013  5:36 AM 11/04/2013  4:14 PM Full Code [id number]  Phillips Grout, MD Inpatient   05/05/2013  1:15 PM 05/06/2013  8:27 PM Full Code [id number]  Kinnie Feil, MD ED    Advance Directive Documentation     Most Recent Value  Type of Advance Directive  Healthcare Power of Attorney, Living will  Pre-existing out of facility DNR order (yellow form or pink MOST form)  -  "MOST" Form in Place?  -       Prognosis:   < 4 weeks would not be surprising based on 20 pound weight loss over the last few weeks, frailty, functional decline, aggressive lung cancer with metastatic burden, patient and family desire to focus on comfort not cure.  Discharge Planning:  Home with the  benefit of hospice of [facility] in-home services for comfort and dignity at end-of-life.  Care plan was discussed with nursing staff, case manager, social worker, and Dr. Jerilee Hoh on next rounds.  Thank you for allowing the Palliative Medicine Team to assist in the care of this patient.   Time In: 0950  Time Out: 1010  Total Time 20 minutes  Prolonged Time Billed  no       Greater than 50%  of this time was spent counseling and coordinating care related to the above assessment and plan.  Drue Novel, NP  Please contact Palliative Medicine Team phone at [phone] for questions and concerns.

## 2016-09-19 NOTE — Discharge Summary (Signed)
Physician Discharge Summary  SCHERYL SANBORN ZOX:[id number] DOB: July 30, 1933 DOA: 09/17/2016  PCP: Sharilyn Sites, MD  Admit date: 09/17/2016 Discharge date: 09/19/2016  Time spent: 45 minutes  Recommendations for Outpatient Follow-up:  -Will be discharged home with hospice services today.   Discharge Diagnoses:  Principal Problem:   Mass of lower lobe of left lung Active Problems:   Hypertension   High cholesterol   COPD (chronic obstructive pulmonary disease) (HCC)   Barrett's esophagus   Type 2 diabetes mellitus (HCC)   Depression   HCAP (healthcare-associated pneumonia)   Sleep apnea   Anemia   Failure to thrive (0-17)   Goals of care, counseling/discussion   Palliative care encounter   Encounter for hospice care discussion   Discharge Condition: Stable  Filed Weights   09/17/16 1355 09/17/16 2153  Weight: 65.8 kg (145 lb) 64.3 kg (141 lb 12.1 oz)    History of present illness:  As per Dr. Olevia Bowens on 6/11: Tina [name] is a 83 y.o. female with medical history significant of his esophagus, chronic diarrhea, COPD, dementia, essential hypertension, gastritis, gout, H. pylori, IBS, pneumonia, Raynaud's disease, scleroderma, sleep apnea not on CPAP, history of CVA, type 2 diabetes who was recently discharged from the hospital due to pneumonia and is brought to the emergency department due to decreased appetite, weight loss of over 20 pounds in the last 2 weeks. The patient has a history of dementia and is unable to provide a reliable history, so is not sure the patient has had a fever, night sweats, hemoptysis, chest pain, dyspnea, abdominal pain or any other symptomatology.   ED Course: Patient was found to be mildly tachycardic and hypertensive in the emergency department. This was partially due to new diagnosis of left lower lobe mass. The patient and daughter stated that she had not taken her evening meds. This improved after she took her when necessary alprazolam and  evening metoprolol. Her WBC is 19.5, hemoglobin 9.9 g/dL and platelets 522. Urinalysis showed WBCs 6-30 per hpf and rare bacteria. Her lactic acid was normal. Sodium 133, potassium 3.5, chloride 96 and bicarbonate 28 mmol/L. Her BUN was 9, creatinine 0.54 and glucose 92 mg/dL. LFTs are unremarkable with the exception of mildly decreased albumin at 3.1 g/dL.  Hospital Course:   Presumed Metastatic Lung Ca -Based on imaging, this appears to be the most likely scenario. -Patient and family have consented to home hospice services; plan has been made for discharge home today.  Procedures:  None   Consultations:  Palliative Care  Oncology  Discharge Instructions  Discharge Instructions    Increase activity slowly    Complete by:  As directed      Allergies as of 09/19/2016      Reactions   Penicillins Other (See Comments)   Unknown Has patient had a PCN reaction causing immediate rash, facial/tongue/throat swelling, SOB or lightheadedness with hypotension: Nounknown Has patient had a PCN reaction causing severe rash involving mucus membranes or skin necrosis: Nounknown Has patient had a PCN reaction that required hospitalization Nounknown Has patient had a PCN reaction occurring within the last 10 years: Nounknown If all of the above answers are "NO", then may      Medication List    STOP taking these medications   BEVESPI AEROSPHERE 9-4.8 MCG/ACT Aero Generic drug:  Glycopyrrolate-Formoterol   clindamycin 150 MG capsule Commonly known as:  CLEOCIN   ferrous sulfate 325 (65 FE) MG tablet   lisinopril 5 MG tablet  Commonly known as:  PRINIVIL,ZESTRIL   Menthol (Topical Analgesic) 154 MG Pads   traMADol 50 MG tablet Commonly known as:  ULTRAM   vitamin B-12 250 MCG tablet Commonly known as:  CYANOCOBALAMIN   VITAMIN D PO   vitamin E 400 UNIT capsule     TAKE these medications   acetaminophen 500 MG tablet Commonly known as:  TYLENOL Take 500 mg by mouth 2 (two)  times daily.   ALPRAZolam 0.25 MG tablet Commonly known as:  XANAX Take 1 tablet (0.25 mg total) by mouth 3 (three) times daily as needed for anxiety. What changed:  reasons to take this   donepezil 10 MG tablet Commonly known as:  ARICEPT Take 10 mg by mouth at bedtime.   escitalopram 10 MG tablet Commonly known as:  LEXAPRO Take 10 mg by mouth daily.   metoCLOPramide 5 MG tablet Commonly known as:  REGLAN Take 5 mg by mouth 4 (four) times daily as needed for nausea or vomiting.   metoprolol tartrate 25 MG tablet Commonly known as:  LOPRESSOR Take 25 mg by mouth 2 (two) times daily.   morphine CONCENTRATE 10 mg / 0.5 ml concentrated solution Take 0.13 mLs (2.6 mg total) by mouth every 2 (two) hours as needed for severe pain.   pantoprazole 40 MG tablet Commonly known as:  PROTONIX Take 1 tablet (40 mg total) by mouth 2 (two) times daily.   SPIRIVA HANDIHALER 18 MCG inhalation capsule Generic drug:  tiotropium USE 1 CAPSULE VIA HANDIHALER ONCE DAILY   VENTOLIN HFA 108 (90 Base) MCG/ACT inhaler Generic drug:  albuterol Inhale 2 puffs into the lungs every 4 (four) hours as needed for wheezing or shortness of breath.      Allergies  Allergen Reactions  . Penicillins Other (See Comments)    Unknown Has patient had a PCN reaction causing immediate rash, facial/tongue/throat swelling, SOB or lightheadedness with hypotension: Nounknown Has patient had a PCN reaction causing severe rash involving mucus membranes or skin necrosis: Nounknown Has patient had a PCN reaction that required hospitalization Nounknown Has patient had a PCN reaction occurring within the last 10 years: Nounknown If all of the above answers are "NO", then may      The results of significant diagnostics from this hospitalization (including imaging, microbiology, ancillary and laboratory) are listed below for reference.    Significant Diagnostic Studies: Dg Chest 2 View  Result Date:  09/17/2016 CLINICAL DATA:  Abdominal, weight loss EXAM: CHEST  2 VIEW COMPARISON:  09/04/2016 FINDINGS: Postsurgical changes of the right lung with calcified pleural plaque. Small left pleural effusion. Left lower lobe airspace disease. No pneumothorax. Stable cardiomediastinal silhouette. No acute osseous abnormality. IMPRESSION: 1. Small left pleural effusion. Left lower lobe airspace disease concerning for pneumonia. Followup PA and lateral chest X-ray is recommended in 3-4 weeks following trial of antibiotic therapy to ensure resolution and exclude underlying malignancy. Electronically Signed   By: Kathreen Devoid   On: 09/17/2016 14:43   Ct Chest Wo Contrast  Result Date: 09/17/2016 CLINICAL DATA:  Masslike opacity left lower lobe on abdomen CT earlier today. EXAM: CT CHEST WITHOUT CONTRAST TECHNIQUE: Multidetector CT imaging of the chest was performed following the standard protocol without IV contrast. COMPARISON:  Abdomen CT 09/17/2016.  Chest CT 09/05/2015. FINDINGS: Cardiovascular: Heart size normal. No substantial pericardial effusion. Coronary artery calcification is noted. Atherosclerotic calcification is noted in the wall of the thoracic aorta. Mediastinum/Nodes: 2.4 cm lymph node identified in the left hilum 1.4 cm  short axis AP window lymph node. 2.1 cm lymph node identified in the inferior left hilum. Lungs/Pleura: Emphysema again noted with biapical pleural-parenchymal scarring. Small left pleural effusion evident. 2.9 cm nodular opacity associated with the left major fissure may be loculated fluid or pleural disease. Large 9.4 x 9.3 cm mass lesion identified largely replacing the inferior left lower lobe. Calcified pleural plaque identified anterior right hemithorax. Upper Abdomen: Bilateral adrenal lesions approaching 3 cm and better characterized on the recent CT. Omental nodules are again noted in the region of the splenic flexure. Musculoskeletal: Bone windows reveal no worrisome lytic or  sclerotic osseous lesions. IMPRESSION: 1. Large left lower lobe mass with left hilar and mediastinal lymphadenopathy. Imaging features are consistent with primary bronchogenic neoplasm. 2. Small left pleural effusion. 3. Probable bilateral adrenal and splenic omental metastases. Emphysema. (SAY30-Z60.9) Abdominal Aortic Atherosclerois (ICD10-170.0) Electronically Signed   By: Misty Stanley M.D.   On: 09/17/2016 17:58   Ct Abdomen Pelvis W Contrast  Result Date: 09/17/2016 CLINICAL DATA:  Abdominal pain, nausea and unintentional weight loss. Diabetes. EXAM: CT ABDOMEN AND PELVIS WITH CONTRAST TECHNIQUE: Multidetector CT imaging of the abdomen and pelvis was performed using the standard protocol following bolus administration of intravenous contrast. CONTRAST:  171mL ISOVUE-300 IOPAMIDOL (ISOVUE-300) INJECTION 61% COMPARISON:  08/04/2010 chest CT, chest CT 06/12/2004 09/17/2016 CXR FINDINGS: Lower chest: Masslike abnormality at the left lung base, partially included with associated small pleural effusion and areas of heterogeneity within likely representing areas of necrosis. This measures 7.6 x 9.2 cm in AP by transverse dimension. The cephalad margin is not entirely excluded. Findings are concerning for neoplasm and possible adjacent left hilar adenopathy. Dedicated chest CT may help for further evaluation of the extent of this abnormality. Tiny 5 mm nodular density abutting the pleura within the lingula is also seen. The right lung demonstrates no acute abnormality. Heart is normal in size. There is debris seen within the distal esophagus. Hepatobiliary: No focal liver abnormality is seen. Status post cholecystectomy. No biliary dilatation. Pancreas: Unremarkable. No pancreatic ductal dilatation or surrounding inflammatory changes. Spleen: Normal in size without focal abnormality. Adrenals/Urinary Tract: New left adrenal mass, measuring 2.8 x 1.8 x 2.4 cm with interval increase in size of heterogeneously  enhancing right adrenal mass measuring 3.2 x 3.2 x 5.3 cm versus 2 cm in 2012 and 2006. Stomach/Bowel: Contracted stomach with normal small bowel rotation. No fold or mural thickening is noted of small intestine. There is diffuse colonic diverticulosis without acute diverticulitis, more severely affecting the sigmoid colon with circular muscle hypertrophy noted. Normal appendix. Vascular/Lymphatic: Bilateral upper abdominal mesenteric masses with enhancement, in the right hemiabdomen, series 2 image 40 and coronal image 23 measuring 2.3 x 1.5 x 1.9 cm pain in the left upper quadrant measuring 2.3 x 2.5 x 1.7 cm, series 2, image 22, coronal image 41 and subdiaphragmatic in the left upper quadrant measuring 2 x 2 x 2.2 cm, series 2, image 15 and coronal image 41. No pelvic sidewall or inguinal lymphadenopathy. Small subcutaneous nodules in the right lower quadrant the largest approximately 6 mm. Aortic atherosclerosis without aneurysm. Reproductive: Uterus and bilateral adnexa are unremarkable. Other: No free air free fluid. Musculoskeletal: Right femoral nail fixation. No apparent lytic or blastic disease. Degenerative disc disease L5-S1. IMPRESSION: 1. There is a masslike abnormality at the left lung base with associated small left pleural effusion, incompletely imaged on this exam but concerning for neoplasm. This measures approximately 7.6 x 9.2 cm in AP by transverse dimension.  Tiny 5 mm pleural-based nodular densities also seen in the lingula. Dedicated chest CT may help for further correlation. 2. Bilateral adrenal masses concerning for metastasis, new on the left and increased on the right since prior comparison studies. The left adrenal mass measures 2.8 x 1.8 x 2.4 cm and on the right, 3.2 x 3.2 x 5.3 cm versus 2 cm previously. 3. Soft tissue masses within the mesentery of the upper abdomen consistent with metastatic mesenteric lymphadenopathy, measuring 2.3 x 1.5 x 1.9 cm in the right hemiabdomen and two in  the left upper quadrant measuring 2.3 x 2.5 x 1.7 cm and 2 x 2 x 2.2 cm. 4. Aortic atherosclerosis. 5. Extensive colonic diverticulosis. Electronically Signed   By: Ashley Royalty M.D.   On: 09/17/2016 16:37   Dg Chest Port 1 View  Addendum Date: 09/04/2016   ADDENDUM REPORT: 09/04/2016 16:05 ADDENDUM: In the body of the report I erroneously attributed the CT scan which was use for comparison to today's date when in fact it was from May 29th 2017. The density at the left lung base on today's chest x-ray is more conspicuous than on the CT scan of 1 year ago and likely does reflect acute pneumonia. The pleural plaque disease is of course a chronic process. The findings were discussed by telephone with Dr. Lita Mains. Electronically Signed   By: David  Martinique M.D.   On: 09/04/2016 16:05   Result Date: 09/04/2016 CLINICAL DATA:  Two weeks of nausea EXAM: PORTABLE CHEST 1 VIEW COMPARISON:  CT scan of the chest of today's date FINDINGS: The lungs are reasonably well inflated. There is abnormal density in the left mid and lower lung consistent with known pneumonia. There is calcified pleural plaque disease in the left correction right mid hemithorax. There is surgical suture as well in the right mid lung. The heart is normal in size. The pulmonary vascularity is not engorged. There is dense calcification in the wall of the thoracic aorta. IMPRESSION: Left lower lobe pneumonia demonstrated best on today's CT scan. Followup PA and lateral chest X-ray is recommended in 3-4 weeks following trial of antibiotic therapy to ensure resolution and exclude underlying malignancy. Pleural plaque on the right consistent with previous asbestos exposure. Thoracic aortic atherosclerosis. Electronically Signed: By: David  Martinique M.D. On: 09/04/2016 15:53   Dg Swallowing Func-speech Pathology  Result Date: 09/06/2016 Objective Swallowing Evaluation: Type of Study: MBS-Modified Barium Swallow Study Patient Details Name: NEVEYAH GARZON  MRN: [id number] Date of Birth: July 02, 1933 Today's Date: 09/06/2016 Time: SLP Start Time (ACUTE ONLY): 1330-SLP Stop Time (ACUTE ONLY): 1355 SLP Time Calculation (min) (ACUTE ONLY): 25 min Past Medical History: Past Medical History: Diagnosis Date . Barrett's esophagus  . Chronic diarrhea  . COPD (chronic obstructive pulmonary disease) ([place])  . Dementia  . Essential hypertension, benign  . Gastritis  . Gout  . Helicobacter pylori (H. pylori)  . Irritable bowel syndrome  . Lower abdominal pain  . Pneumonia  . Raynaud's disease  . 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 Scleroderma ([place])  . Sleep apnea   Not on CPAP . Stroke ([place])  . Type 2 diabetes mellitus ([place])  Past Surgical History: Past Surgical History: Procedure Laterality Date . BACK SURGERY   . CHOLECYSTECTOMY   . COLONOSCOPY  12/2008 . ESOPHAGEAL DILATION N/A 11/02/2015  Procedure: ESOPHAGEAL DILATION;  Surgeon: Rogene Houston, MD;  Location: [facility];  Service: Endoscopy;  Laterality: N/A; . ESOPHAGOGASTRODUODENOSCOPY  06/09 . ESOPHAGOGASTRODUODENOSCOPY  02/02/05  EGD ED .  ESOPHAGOGASTRODUODENOSCOPY  03/08/00  EGD ED . ESOPHAGOGASTRODUODENOSCOPY N/A 11/02/2015  Procedure: ESOPHAGOGASTRODUODENOSCOPY (EGD);  Surgeon: Rogene Houston, MD;  Location: [facility];  Service: Endoscopy;  Laterality: N/A; . INTRAMEDULLARY (IM) NAIL INTERTROCHANTERIC Right 11/04/2013  Procedure: INTERNAL FIXATION RIGHT HIP WITH GAMMA NAIL;  Surgeon: Carole Civil, MD;  Location: [facility] ORS;  Service: Orthopedics;  Laterality: Right; . LUNG SURGERY  2002  Lung collapsed . PATELLA FRACTURE SURGERY   HPI: Tina [name] is a 83 y.o. female with medical history significant for hypertension, COPD, dementia, and scleroderma with esophageal dysphagia status post dilations, now presenting to the emergency department with 2 weeks of dysphagia, nausea, vomiting, anorexia, cough, and shortness of breath. Patient had been in her usual state of health until approximately 2 weeks ago when she began having  more trouble swallowing and reports that this was accompanied by nausea with occasional vomiting. These symptoms have persisted and may have worsened some over the past 2 weeks, and she has also developed coughing and shortness of breath in recent days. She denies fevers or chills and denies chest pain or palpitations. There has not been any diarrhea and she denies any significant abdominal pain.  Subjective: "I can't get my dentures in." Assessment / Plan / Recommendation CHL IP CLINICAL IMPRESSIONS 09/06/2016 Clinical Impression Pt presents with mild/moderate oral phase dysphagia, mild pharyngeal phase, and suspected primary esophageal phase dysphagia in setting of scleroderma. Pt has extremely limited oral opening due to suspected fibrosis and she was in fact unable to place her dentures for the test. Oral phase was prolonged and pt with delayed anterior posterior transit of bolus and reduced lingual retraction resulting in mild oral residuals necessitating double swallows at times. Pharyngeal phase was timely and without penetration, aspiration, or significant pharyneal residuals across textures and consistencies. Barium tablet was transiently delayed at the UES, but did transfer through UES with additional swallows of thin barium; notable prominent cricopharyngeus with trace backflow of thins into the pyriforms. Esophageal sweep revealed delayed emptying of esophagus as evidenced by barium filled esophagus for the duration of the study. The barium tablet did pass through the LES. Recommend D3/mech soft and thin liquids with standard aspiration and strict reflux precautions. Pt is at increased risk for post prandial aspiration from possible bolus backflow from esophagus. Pt may benefit from trismus therapy if indicated to facilitate increased maximal incisional opening. Above to Dr. Laural Golden and Pt. SLP will follow while in acute setting. SLP Visit Diagnosis Dysphagia, oropharyngeal phase (R13.12);Dysphagia,  pharyngoesophageal phase (R13.14) Attention and concentration deficit following -- Frontal lobe and executive function deficit following -- Impact on safety and function Mild aspiration risk   CHL IP TREATMENT RECOMMENDATION 09/06/2016 Treatment Recommendations Therapy as outlined in treatment plan below   Prognosis 09/06/2016 Prognosis for Safe Diet Advancement Good Barriers to Reach Goals Severity of deficits Barriers/Prognosis Comment -- CHL IP DIET RECOMMENDATION 09/06/2016 SLP Diet Recommendations Dysphagia 3 (Mech soft) solids;Thin liquid Liquid Administration via Cup;Straw Medication Administration Crushed with puree Compensations Multiple dry swallows after each bite/sip Postural Changes Remain semi-upright after after feeds/meals (Comment);Seated upright at 90 degrees   CHL IP OTHER RECOMMENDATIONS 09/06/2016 Recommended Consults Consider esophageal assessment Oral Care Recommendations Oral care BID;Patient independent with oral care Other Recommendations Clarify dietary restrictions   CHL IP FOLLOW UP RECOMMENDATIONS 09/06/2016 Follow up Recommendations None   CHL IP FREQUENCY AND DURATION 09/06/2016 Speech Therapy Frequency (ACUTE ONLY) min 2x/week Treatment Duration 1 week      CHL IP ORAL  PHASE 09/06/2016 Oral Phase Impaired Oral - Pudding Teaspoon -- Oral - Pudding Cup -- Oral - Honey Teaspoon -- Oral - Honey Cup -- Oral - Nectar Teaspoon -- Oral - Nectar Cup -- Oral - Nectar Straw -- Oral - Thin Teaspoon -- Oral - Thin Cup Weak lingual manipulation;Incomplete tongue to palate contact;Lingual/palatal residue Oral - Thin Straw -- Oral - Puree -- Oral - Mech Soft Impaired mastication;Weak lingual manipulation;Lingual/palatal residue;Delayed oral transit;Decreased bolus cohesion Oral - Regular -- Oral - Multi-Consistency -- Oral - Pill Delayed oral transit;Reduced posterior propulsion Oral Phase - Comment lingual residuals trickle down to valleculae  CHL IP PHARYNGEAL PHASE 09/06/2016 Pharyngeal Phase Impaired  Pharyngeal- Pudding Teaspoon -- Pharyngeal -- Pharyngeal- Pudding Cup -- Pharyngeal -- Pharyngeal- Honey Teaspoon -- Pharyngeal -- Pharyngeal- Honey Cup -- Pharyngeal -- Pharyngeal- Nectar Teaspoon -- Pharyngeal -- Pharyngeal- Nectar Cup -- Pharyngeal -- Pharyngeal- Nectar Straw -- Pharyngeal -- Pharyngeal- Thin Teaspoon -- Pharyngeal -- Pharyngeal- Thin Cup Pharyngeal residue - valleculae;Pharyngeal residue - cp segment;Reduced tongue base retraction Pharyngeal -- Pharyngeal- Thin Straw -- Pharyngeal -- Pharyngeal- Puree -- Pharyngeal -- Pharyngeal- Mechanical Soft -- Pharyngeal -- Pharyngeal- Regular -- Pharyngeal -- Pharyngeal- Multi-consistency -- Pharyngeal -- Pharyngeal- Pill -- Pharyngeal -- Pharyngeal Comment --  CHL IP CERVICAL ESOPHAGEAL PHASE 09/06/2016 Cervical Esophageal Phase Impaired Pudding Teaspoon -- Pudding Cup -- Honey Teaspoon -- Honey Cup -- Nectar Teaspoon -- Nectar Cup -- Nectar Straw -- Thin Teaspoon -- Thin Cup -- Thin Straw -- Puree Prominent cricopharyngeal segment Mechanical Soft -- Regular -- Multi-consistency -- Pill -- Cervical Esophageal Comment reduced emptying noted in esophagus Thank you, Genene Churn, [place] No flowsheet data found. PORTER,DABNEY 09/06/2016, 5:59 PM               Microbiology: Recent Results (from the past 240 hour(s))  Urine culture     Status: Abnormal (Preliminary result)   Collection Time: 09/17/16  2:14 PM  Result Value Ref Range Status   Specimen Description URINE, RANDOM  Final   Special Requests NONE  Final   Culture >=100,000 COLONIES/mL GRAM NEGATIVE RODS (A)  Final   Report Status PENDING  Incomplete     Labs: Basic Metabolic Panel:  Recent Labs Lab 09/17/16 1448 09/18/16 0448 09/19/16 0601  NA 133* 131* 131*  K 3.5 3.5 3.8  CL 96* 97* 96*  CO2 28 26 27   GLUCOSE 92 102* 92  BUN 9 8 8   CREATININE 0.54 0.56 0.59  CALCIUM 9.9 9.5 9.6   Liver Function Tests:  Recent Labs Lab 09/17/16 1448  AST 14*  ALT 9*   ALKPHOS 75  BILITOT 0.7  PROT 7.0  ALBUMIN 3.1*    Recent Labs Lab 09/17/16 1448  LIPASE 30   No results for input(s): AMMONIA in the last 168 hours. CBC:  Recent Labs Lab 09/17/16 1448 09/18/16 0448 09/19/16 0601  WBC 19.5* 17.7* 15.3*  NEUTROABS 14.7* 12.8* 11.4*  HGB 9.9* 9.5* 9.3*  HCT 31.0* 29.3* 29.2*  MCV 100.6* 100.3* 101.0*  PLT 522* 532* 496*   Cardiac Enzymes:  Recent Labs Lab 09/17/16 1448  TROPONINI <0.03   BNP: BNP (last 3 results) No results for input(s): BNP in the last 8760 hours.  ProBNP (last 3 results) No results for input(s): PROBNP in the last 8760 hours.  CBG:  Recent Labs Lab 09/18/16 1115 09/18/16 1723 09/18/16 2118 09/19/16 0743 09/19/16 1113  GLUCAP 113* 91 83 84 105*       Signed:  HERNANDEZ ACOSTA,ESTELA  [facility] Pager: [phone] 09/19/2016, 11:52 AM

## 2016-09-19 NOTE — Care Management Note (Signed)
Case Management Note  Patient Details  Name: MARYAGNES CARRASCO MRN: [id number] Date of Birth: [birth year]    Expected Discharge Date:     09/19/2016             Expected Discharge Plan:  Home w Hospice Care  In-House Referral:     Discharge planning Services  CM Consult  Post Acute Care Choice:    Choice offered to:  Adult Children  DME Arranged:    DME Agency:     HH Arranged:    HH Agency:     Status of Service:  Completed, signed off  If discussed at [place] of Stay Meetings, dates discussed:    Additional Comments: Patient discharging home today with Hospice services provided by [facility]. No DME needs. Family will transport patient home. [facility] Hospice would like to be notified when patient is leaving the hospital at [phone].  Maycen Degregory, [place]uncey [place], RN 09/19/2016, 10:56 AM

## 2016-09-19 NOTE — Progress Notes (Signed)
Pt without IV access at time of discharge. Discharge instructions including medications and follow up appointments were reviewed and discussed with patient's daughters. Pt's daughters verbalized understanding of discharge instructions. All questions were answered and no further questions at this time. Pt in stable condition and in no acute distress at time of discharge. Pt will be escorted by nurse tech.

## 2016-09-20 DIAGNOSIS — F0150 Vascular dementia without behavioral disturbance: Secondary | ICD-10-CM | POA: Diagnosis not present

## 2016-09-20 DIAGNOSIS — R531 Weakness: Secondary | ICD-10-CM | POA: Diagnosis not present

## 2016-09-20 DIAGNOSIS — C3490 Malignant neoplasm of unspecified part of unspecified bronchus or lung: Secondary | ICD-10-CM | POA: Diagnosis not present

## 2016-09-20 DIAGNOSIS — R112 Nausea with vomiting, unspecified: Secondary | ICD-10-CM | POA: Diagnosis not present

## 2016-09-20 DIAGNOSIS — R4702 Dysphasia: Secondary | ICD-10-CM | POA: Diagnosis not present

## 2016-09-20 DIAGNOSIS — E118 Type 2 diabetes mellitus with unspecified complications: Secondary | ICD-10-CM | POA: Diagnosis not present

## 2016-09-20 DIAGNOSIS — J449 Chronic obstructive pulmonary disease, unspecified: Secondary | ICD-10-CM | POA: Diagnosis not present

## 2016-09-20 DIAGNOSIS — L940 Localized scleroderma [morphea]: Secondary | ICD-10-CM | POA: Diagnosis not present

## 2016-09-20 DIAGNOSIS — I10 Essential (primary) hypertension: Secondary | ICD-10-CM | POA: Diagnosis not present

## 2016-09-20 DIAGNOSIS — K22719 Barrett's esophagus with dysplasia, unspecified: Secondary | ICD-10-CM | POA: Diagnosis not present

## 2016-09-20 DIAGNOSIS — R0602 Shortness of breath: Secondary | ICD-10-CM | POA: Diagnosis not present

## 2016-09-20 DIAGNOSIS — M109 Gout, unspecified: Secondary | ICD-10-CM | POA: Diagnosis not present

## 2016-09-20 DIAGNOSIS — I7300 Raynaud's syndrome without gangrene: Secondary | ICD-10-CM | POA: Diagnosis not present

## 2016-09-20 LAB — URINE CULTURE: Culture: >=100000 — AB

## 2016-09-21 DIAGNOSIS — I10 Essential (primary) hypertension: Secondary | ICD-10-CM | POA: Diagnosis not present

## 2016-09-21 DIAGNOSIS — E118 Type 2 diabetes mellitus with unspecified complications: Secondary | ICD-10-CM | POA: Diagnosis not present

## 2016-09-21 DIAGNOSIS — C3490 Malignant neoplasm of unspecified part of unspecified bronchus or lung: Secondary | ICD-10-CM | POA: Diagnosis not present

## 2016-09-21 DIAGNOSIS — F0150 Vascular dementia without behavioral disturbance: Secondary | ICD-10-CM | POA: Diagnosis not present

## 2016-09-21 DIAGNOSIS — J449 Chronic obstructive pulmonary disease, unspecified: Secondary | ICD-10-CM | POA: Diagnosis not present

## 2016-09-21 DIAGNOSIS — K22719 Barrett's esophagus with dysplasia, unspecified: Secondary | ICD-10-CM | POA: Diagnosis not present

## 2016-09-25 DIAGNOSIS — J449 Chronic obstructive pulmonary disease, unspecified: Secondary | ICD-10-CM | POA: Diagnosis not present

## 2016-09-25 DIAGNOSIS — F0150 Vascular dementia without behavioral disturbance: Secondary | ICD-10-CM | POA: Diagnosis not present

## 2016-09-25 DIAGNOSIS — C3490 Malignant neoplasm of unspecified part of unspecified bronchus or lung: Secondary | ICD-10-CM | POA: Diagnosis not present

## 2016-09-25 DIAGNOSIS — K22719 Barrett's esophagus with dysplasia, unspecified: Secondary | ICD-10-CM | POA: Diagnosis not present

## 2016-09-25 DIAGNOSIS — E118 Type 2 diabetes mellitus with unspecified complications: Secondary | ICD-10-CM | POA: Diagnosis not present

## 2016-09-25 DIAGNOSIS — I10 Essential (primary) hypertension: Secondary | ICD-10-CM | POA: Diagnosis not present

## 2016-09-27 DIAGNOSIS — K22719 Barrett's esophagus with dysplasia, unspecified: Secondary | ICD-10-CM | POA: Diagnosis not present

## 2016-09-27 DIAGNOSIS — F0150 Vascular dementia without behavioral disturbance: Secondary | ICD-10-CM | POA: Diagnosis not present

## 2016-09-27 DIAGNOSIS — E118 Type 2 diabetes mellitus with unspecified complications: Secondary | ICD-10-CM | POA: Diagnosis not present

## 2016-09-27 DIAGNOSIS — C3490 Malignant neoplasm of unspecified part of unspecified bronchus or lung: Secondary | ICD-10-CM | POA: Diagnosis not present

## 2016-09-27 DIAGNOSIS — I10 Essential (primary) hypertension: Secondary | ICD-10-CM | POA: Diagnosis not present

## 2016-09-27 DIAGNOSIS — J449 Chronic obstructive pulmonary disease, unspecified: Secondary | ICD-10-CM | POA: Diagnosis not present

## 2016-09-28 DIAGNOSIS — F0150 Vascular dementia without behavioral disturbance: Secondary | ICD-10-CM | POA: Diagnosis not present

## 2016-09-28 DIAGNOSIS — J449 Chronic obstructive pulmonary disease, unspecified: Secondary | ICD-10-CM | POA: Diagnosis not present

## 2016-09-28 DIAGNOSIS — K22719 Barrett's esophagus with dysplasia, unspecified: Secondary | ICD-10-CM | POA: Diagnosis not present

## 2016-09-28 DIAGNOSIS — I10 Essential (primary) hypertension: Secondary | ICD-10-CM | POA: Diagnosis not present

## 2016-09-28 DIAGNOSIS — E118 Type 2 diabetes mellitus with unspecified complications: Secondary | ICD-10-CM | POA: Diagnosis not present

## 2016-09-28 DIAGNOSIS — C3490 Malignant neoplasm of unspecified part of unspecified bronchus or lung: Secondary | ICD-10-CM | POA: Diagnosis not present

## 2016-10-02 DIAGNOSIS — K22719 Barrett's esophagus with dysplasia, unspecified: Secondary | ICD-10-CM | POA: Diagnosis not present

## 2016-10-02 DIAGNOSIS — E118 Type 2 diabetes mellitus with unspecified complications: Secondary | ICD-10-CM | POA: Diagnosis not present

## 2016-10-02 DIAGNOSIS — I10 Essential (primary) hypertension: Secondary | ICD-10-CM | POA: Diagnosis not present

## 2016-10-02 DIAGNOSIS — J449 Chronic obstructive pulmonary disease, unspecified: Secondary | ICD-10-CM | POA: Diagnosis not present

## 2016-10-02 DIAGNOSIS — F0150 Vascular dementia without behavioral disturbance: Secondary | ICD-10-CM | POA: Diagnosis not present

## 2016-10-02 DIAGNOSIS — C3490 Malignant neoplasm of unspecified part of unspecified bronchus or lung: Secondary | ICD-10-CM | POA: Diagnosis not present

## 2016-10-03 DIAGNOSIS — K22719 Barrett's esophagus with dysplasia, unspecified: Secondary | ICD-10-CM | POA: Diagnosis not present

## 2016-10-03 DIAGNOSIS — C3490 Malignant neoplasm of unspecified part of unspecified bronchus or lung: Secondary | ICD-10-CM | POA: Diagnosis not present

## 2016-10-03 DIAGNOSIS — F0150 Vascular dementia without behavioral disturbance: Secondary | ICD-10-CM | POA: Diagnosis not present

## 2016-10-03 DIAGNOSIS — J449 Chronic obstructive pulmonary disease, unspecified: Secondary | ICD-10-CM | POA: Diagnosis not present

## 2016-10-03 DIAGNOSIS — E118 Type 2 diabetes mellitus with unspecified complications: Secondary | ICD-10-CM | POA: Diagnosis not present

## 2016-10-03 DIAGNOSIS — I10 Essential (primary) hypertension: Secondary | ICD-10-CM | POA: Diagnosis not present

## 2016-10-04 DIAGNOSIS — J449 Chronic obstructive pulmonary disease, unspecified: Secondary | ICD-10-CM | POA: Diagnosis not present

## 2016-10-04 DIAGNOSIS — F0150 Vascular dementia without behavioral disturbance: Secondary | ICD-10-CM | POA: Diagnosis not present

## 2016-10-04 DIAGNOSIS — E118 Type 2 diabetes mellitus with unspecified complications: Secondary | ICD-10-CM | POA: Diagnosis not present

## 2016-10-04 DIAGNOSIS — I10 Essential (primary) hypertension: Secondary | ICD-10-CM | POA: Diagnosis not present

## 2016-10-04 DIAGNOSIS — C3490 Malignant neoplasm of unspecified part of unspecified bronchus or lung: Secondary | ICD-10-CM | POA: Diagnosis not present

## 2016-10-04 DIAGNOSIS — K22719 Barrett's esophagus with dysplasia, unspecified: Secondary | ICD-10-CM | POA: Diagnosis not present

## 2016-10-05 DIAGNOSIS — E118 Type 2 diabetes mellitus with unspecified complications: Secondary | ICD-10-CM | POA: Diagnosis not present

## 2016-10-05 DIAGNOSIS — I10 Essential (primary) hypertension: Secondary | ICD-10-CM | POA: Diagnosis not present

## 2016-10-05 DIAGNOSIS — C3490 Malignant neoplasm of unspecified part of unspecified bronchus or lung: Secondary | ICD-10-CM | POA: Diagnosis not present

## 2016-10-05 DIAGNOSIS — K22719 Barrett's esophagus with dysplasia, unspecified: Secondary | ICD-10-CM | POA: Diagnosis not present

## 2016-10-05 DIAGNOSIS — J449 Chronic obstructive pulmonary disease, unspecified: Secondary | ICD-10-CM | POA: Diagnosis not present

## 2016-10-05 DIAGNOSIS — F0150 Vascular dementia without behavioral disturbance: Secondary | ICD-10-CM | POA: Diagnosis not present

## 2016-10-06 DIAGNOSIS — J449 Chronic obstructive pulmonary disease, unspecified: Secondary | ICD-10-CM | POA: Diagnosis not present

## 2016-10-06 DIAGNOSIS — C3490 Malignant neoplasm of unspecified part of unspecified bronchus or lung: Secondary | ICD-10-CM | POA: Diagnosis not present

## 2016-10-06 DIAGNOSIS — E118 Type 2 diabetes mellitus with unspecified complications: Secondary | ICD-10-CM | POA: Diagnosis not present

## 2016-10-06 DIAGNOSIS — I10 Essential (primary) hypertension: Secondary | ICD-10-CM | POA: Diagnosis not present

## 2016-10-06 DIAGNOSIS — F0150 Vascular dementia without behavioral disturbance: Secondary | ICD-10-CM | POA: Diagnosis not present

## 2016-10-06 DIAGNOSIS — K22719 Barrett's esophagus with dysplasia, unspecified: Secondary | ICD-10-CM | POA: Diagnosis not present

## 2016-10-07 DIAGNOSIS — R112 Nausea with vomiting, unspecified: Secondary | ICD-10-CM | POA: Diagnosis not present

## 2016-10-07 DIAGNOSIS — E118 Type 2 diabetes mellitus with unspecified complications: Secondary | ICD-10-CM | POA: Diagnosis not present

## 2016-10-07 DIAGNOSIS — I10 Essential (primary) hypertension: Secondary | ICD-10-CM | POA: Diagnosis not present

## 2016-10-07 DIAGNOSIS — J449 Chronic obstructive pulmonary disease, unspecified: Secondary | ICD-10-CM | POA: Diagnosis not present

## 2016-10-07 DIAGNOSIS — R531 Weakness: Secondary | ICD-10-CM | POA: Diagnosis not present

## 2016-10-07 DIAGNOSIS — R4702 Dysphasia: Secondary | ICD-10-CM | POA: Diagnosis not present

## 2016-10-07 DIAGNOSIS — I7300 Raynaud's syndrome without gangrene: Secondary | ICD-10-CM | POA: Diagnosis not present

## 2016-10-07 DIAGNOSIS — L940 Localized scleroderma [morphea]: Secondary | ICD-10-CM | POA: Diagnosis not present

## 2016-10-07 DIAGNOSIS — F0150 Vascular dementia without behavioral disturbance: Secondary | ICD-10-CM | POA: Diagnosis not present

## 2016-10-07 DIAGNOSIS — R0602 Shortness of breath: Secondary | ICD-10-CM | POA: Diagnosis not present

## 2016-10-07 DIAGNOSIS — C3490 Malignant neoplasm of unspecified part of unspecified bronchus or lung: Secondary | ICD-10-CM | POA: Diagnosis not present

## 2016-10-07 DIAGNOSIS — M109 Gout, unspecified: Secondary | ICD-10-CM | POA: Diagnosis not present

## 2016-10-07 DIAGNOSIS — K22719 Barrett's esophagus with dysplasia, unspecified: Secondary | ICD-10-CM | POA: Diagnosis not present

## 2016-10-08 DIAGNOSIS — K22719 Barrett's esophagus with dysplasia, unspecified: Secondary | ICD-10-CM | POA: Diagnosis not present

## 2016-10-08 DIAGNOSIS — C3490 Malignant neoplasm of unspecified part of unspecified bronchus or lung: Secondary | ICD-10-CM | POA: Diagnosis not present

## 2016-10-08 DIAGNOSIS — F0150 Vascular dementia without behavioral disturbance: Secondary | ICD-10-CM | POA: Diagnosis not present

## 2016-10-08 DIAGNOSIS — E118 Type 2 diabetes mellitus with unspecified complications: Secondary | ICD-10-CM | POA: Diagnosis not present

## 2016-10-08 DIAGNOSIS — I10 Essential (primary) hypertension: Secondary | ICD-10-CM | POA: Diagnosis not present

## 2016-10-08 DIAGNOSIS — J449 Chronic obstructive pulmonary disease, unspecified: Secondary | ICD-10-CM | POA: Diagnosis not present

## 2016-10-09 DIAGNOSIS — I10 Essential (primary) hypertension: Secondary | ICD-10-CM | POA: Diagnosis not present

## 2016-10-09 DIAGNOSIS — C3490 Malignant neoplasm of unspecified part of unspecified bronchus or lung: Secondary | ICD-10-CM | POA: Diagnosis not present

## 2016-10-09 DIAGNOSIS — J449 Chronic obstructive pulmonary disease, unspecified: Secondary | ICD-10-CM | POA: Diagnosis not present

## 2016-10-09 DIAGNOSIS — F0150 Vascular dementia without behavioral disturbance: Secondary | ICD-10-CM | POA: Diagnosis not present

## 2016-10-09 DIAGNOSIS — E118 Type 2 diabetes mellitus with unspecified complications: Secondary | ICD-10-CM | POA: Diagnosis not present

## 2016-10-09 DIAGNOSIS — K22719 Barrett's esophagus with dysplasia, unspecified: Secondary | ICD-10-CM | POA: Diagnosis not present

## 2016-10-10 DIAGNOSIS — C3490 Malignant neoplasm of unspecified part of unspecified bronchus or lung: Secondary | ICD-10-CM | POA: Diagnosis not present

## 2016-10-10 DIAGNOSIS — E118 Type 2 diabetes mellitus with unspecified complications: Secondary | ICD-10-CM | POA: Diagnosis not present

## 2016-10-10 DIAGNOSIS — J449 Chronic obstructive pulmonary disease, unspecified: Secondary | ICD-10-CM | POA: Diagnosis not present

## 2016-10-10 DIAGNOSIS — K22719 Barrett's esophagus with dysplasia, unspecified: Secondary | ICD-10-CM | POA: Diagnosis not present

## 2016-10-10 DIAGNOSIS — I10 Essential (primary) hypertension: Secondary | ICD-10-CM | POA: Diagnosis not present

## 2016-10-10 DIAGNOSIS — F0150 Vascular dementia without behavioral disturbance: Secondary | ICD-10-CM | POA: Diagnosis not present

## 2016-10-11 DIAGNOSIS — F0150 Vascular dementia without behavioral disturbance: Secondary | ICD-10-CM | POA: Diagnosis not present

## 2016-10-11 DIAGNOSIS — J449 Chronic obstructive pulmonary disease, unspecified: Secondary | ICD-10-CM | POA: Diagnosis not present

## 2016-10-11 DIAGNOSIS — K22719 Barrett's esophagus with dysplasia, unspecified: Secondary | ICD-10-CM | POA: Diagnosis not present

## 2016-10-11 DIAGNOSIS — C3490 Malignant neoplasm of unspecified part of unspecified bronchus or lung: Secondary | ICD-10-CM | POA: Diagnosis not present

## 2016-10-11 DIAGNOSIS — E118 Type 2 diabetes mellitus with unspecified complications: Secondary | ICD-10-CM | POA: Diagnosis not present

## 2016-10-11 DIAGNOSIS — I10 Essential (primary) hypertension: Secondary | ICD-10-CM | POA: Diagnosis not present

## 2016-10-12 DIAGNOSIS — F0150 Vascular dementia without behavioral disturbance: Secondary | ICD-10-CM | POA: Diagnosis not present

## 2016-10-12 DIAGNOSIS — J449 Chronic obstructive pulmonary disease, unspecified: Secondary | ICD-10-CM | POA: Diagnosis not present

## 2016-10-12 DIAGNOSIS — C3490 Malignant neoplasm of unspecified part of unspecified bronchus or lung: Secondary | ICD-10-CM | POA: Diagnosis not present

## 2016-10-12 DIAGNOSIS — E118 Type 2 diabetes mellitus with unspecified complications: Secondary | ICD-10-CM | POA: Diagnosis not present

## 2016-10-12 DIAGNOSIS — K22719 Barrett's esophagus with dysplasia, unspecified: Secondary | ICD-10-CM | POA: Diagnosis not present

## 2016-10-12 DIAGNOSIS — I10 Essential (primary) hypertension: Secondary | ICD-10-CM | POA: Diagnosis not present

## 2016-10-13 DIAGNOSIS — K22719 Barrett's esophagus with dysplasia, unspecified: Secondary | ICD-10-CM | POA: Diagnosis not present

## 2016-10-13 DIAGNOSIS — C3490 Malignant neoplasm of unspecified part of unspecified bronchus or lung: Secondary | ICD-10-CM | POA: Diagnosis not present

## 2016-10-13 DIAGNOSIS — I10 Essential (primary) hypertension: Secondary | ICD-10-CM | POA: Diagnosis not present

## 2016-10-13 DIAGNOSIS — J449 Chronic obstructive pulmonary disease, unspecified: Secondary | ICD-10-CM | POA: Diagnosis not present

## 2016-10-13 DIAGNOSIS — E118 Type 2 diabetes mellitus with unspecified complications: Secondary | ICD-10-CM | POA: Diagnosis not present

## 2016-10-13 DIAGNOSIS — F0150 Vascular dementia without behavioral disturbance: Secondary | ICD-10-CM | POA: Diagnosis not present

## 2016-10-14 DIAGNOSIS — J449 Chronic obstructive pulmonary disease, unspecified: Secondary | ICD-10-CM | POA: Diagnosis not present

## 2016-10-14 DIAGNOSIS — C3490 Malignant neoplasm of unspecified part of unspecified bronchus or lung: Secondary | ICD-10-CM | POA: Diagnosis not present

## 2016-10-14 DIAGNOSIS — E118 Type 2 diabetes mellitus with unspecified complications: Secondary | ICD-10-CM | POA: Diagnosis not present

## 2016-10-14 DIAGNOSIS — F0150 Vascular dementia without behavioral disturbance: Secondary | ICD-10-CM | POA: Diagnosis not present

## 2016-10-14 DIAGNOSIS — I10 Essential (primary) hypertension: Secondary | ICD-10-CM | POA: Diagnosis not present

## 2016-10-14 DIAGNOSIS — K22719 Barrett's esophagus with dysplasia, unspecified: Secondary | ICD-10-CM | POA: Diagnosis not present

## 2016-10-15 DIAGNOSIS — E118 Type 2 diabetes mellitus with unspecified complications: Secondary | ICD-10-CM | POA: Diagnosis not present

## 2016-10-15 DIAGNOSIS — I10 Essential (primary) hypertension: Secondary | ICD-10-CM | POA: Diagnosis not present

## 2016-10-15 DIAGNOSIS — J449 Chronic obstructive pulmonary disease, unspecified: Secondary | ICD-10-CM | POA: Diagnosis not present

## 2016-10-15 DIAGNOSIS — C3490 Malignant neoplasm of unspecified part of unspecified bronchus or lung: Secondary | ICD-10-CM | POA: Diagnosis not present

## 2016-10-15 DIAGNOSIS — F0150 Vascular dementia without behavioral disturbance: Secondary | ICD-10-CM | POA: Diagnosis not present

## 2016-10-15 DIAGNOSIS — K22719 Barrett's esophagus with dysplasia, unspecified: Secondary | ICD-10-CM | POA: Diagnosis not present

## 2016-10-16 DIAGNOSIS — C3490 Malignant neoplasm of unspecified part of unspecified bronchus or lung: Secondary | ICD-10-CM | POA: Diagnosis not present

## 2016-10-16 DIAGNOSIS — F0150 Vascular dementia without behavioral disturbance: Secondary | ICD-10-CM | POA: Diagnosis not present

## 2016-10-16 DIAGNOSIS — J449 Chronic obstructive pulmonary disease, unspecified: Secondary | ICD-10-CM | POA: Diagnosis not present

## 2016-10-16 DIAGNOSIS — K22719 Barrett's esophagus with dysplasia, unspecified: Secondary | ICD-10-CM | POA: Diagnosis not present

## 2016-10-16 DIAGNOSIS — E118 Type 2 diabetes mellitus with unspecified complications: Secondary | ICD-10-CM | POA: Diagnosis not present

## 2016-10-16 DIAGNOSIS — I10 Essential (primary) hypertension: Secondary | ICD-10-CM | POA: Diagnosis not present

## 2016-11-07 DEATH — deceased

## 2019-03-14 IMAGING — CR DG CHEST 1V PORT
1 series · 1 of 1 positions shown · non-contrast
Comparison: CT scan of the chest of today's date

ADDENDUM:
In the body of the report I erroneously attributed the CT scan which
was use for comparison to today's date when in fact it was from Saturday September, 2015. The density at the left lung base on today's chest x-ray
is more conspicuous than on the CT scan of 1 year ago and likely
does reflect acute pneumonia. The pleural plaque disease is of
course a chronic process.

The findings were discussed by telephone with Dr. Lainie.
CLINICAL DATA: Two weeks of nausea
EXAM:
PORTABLE CHEST 1 VIEW

[portable]
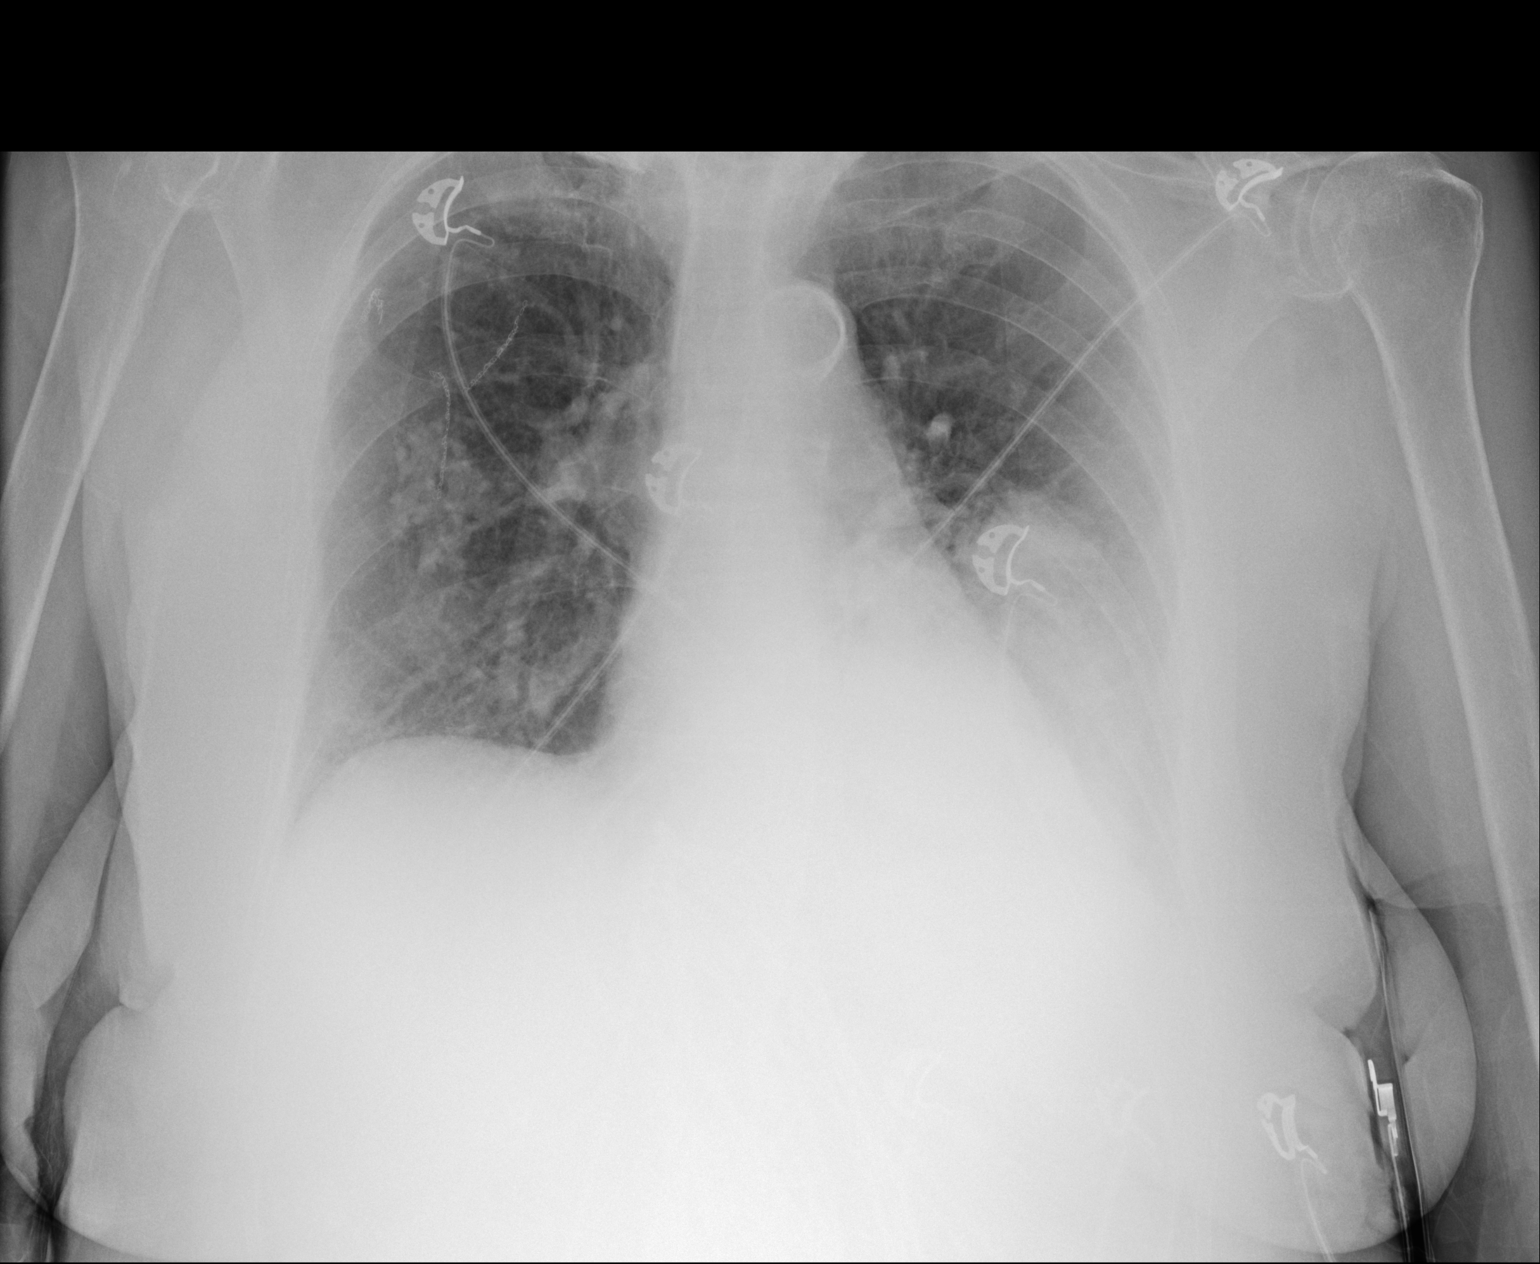

[1 of 1 positions shown; findings below may reference images not displayed]

FINDINGS: The lungs are reasonably well inflated. There is abnormal density in
the left mid and lower lung consistent with known pneumonia. There
is calcified pleural plaque disease in the left correction right mid
hemithorax. There is surgical suture as well in the right mid lung.
The heart is normal in size. The pulmonary vascularity is not
engorged. There is dense calcification in the wall of the thoracic
aorta.
IMPRESSION: Left lower lobe pneumonia demonstrated best on today's CT scan.
Followup PA and lateral chest X-ray is recommended in 3-4 weeks
following trial of antibiotic therapy to ensure resolution and
exclude underlying malignancy.

Pleural plaque on the right consistent with previous asbestos
exposure.

Thoracic aortic atherosclerosis.

## 2019-03-16 IMAGING — RF DG SWALLOWING FUNCTION - [facility]
13 of 19 series · 13 of 24 positions shown · non-contrast
Comparison: none

[Series 1: run · 1 of 30 frames shown (1 of 13)]
[frame 1/30]
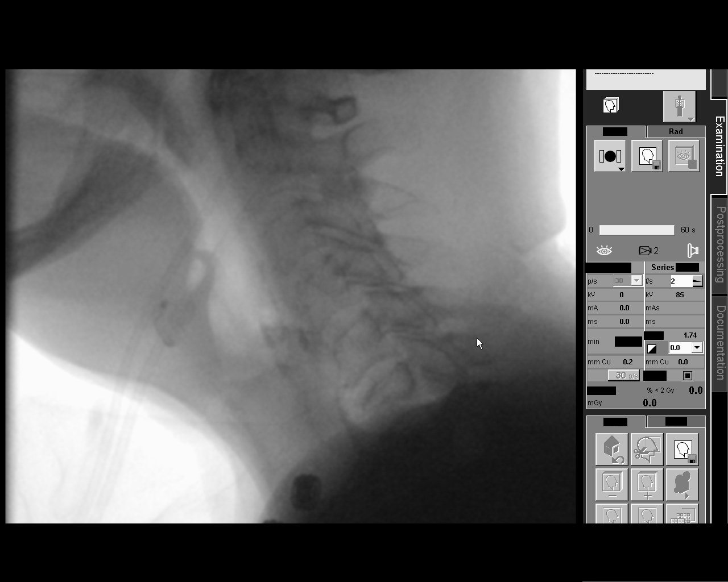

[Series 2: run · 1 of 420 frames shown (2 of 13)]
[frame 144/420]
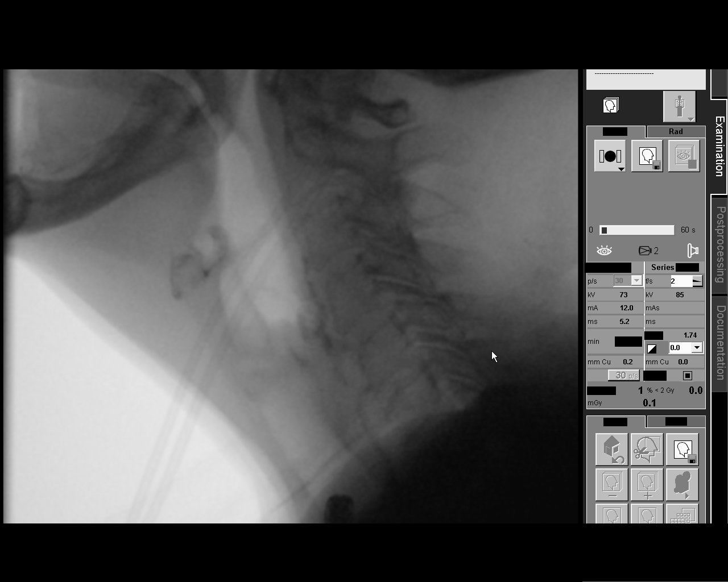

[Series 4: run · 1 of 73 frames shown (3 of 13)]
[frame 11/73]
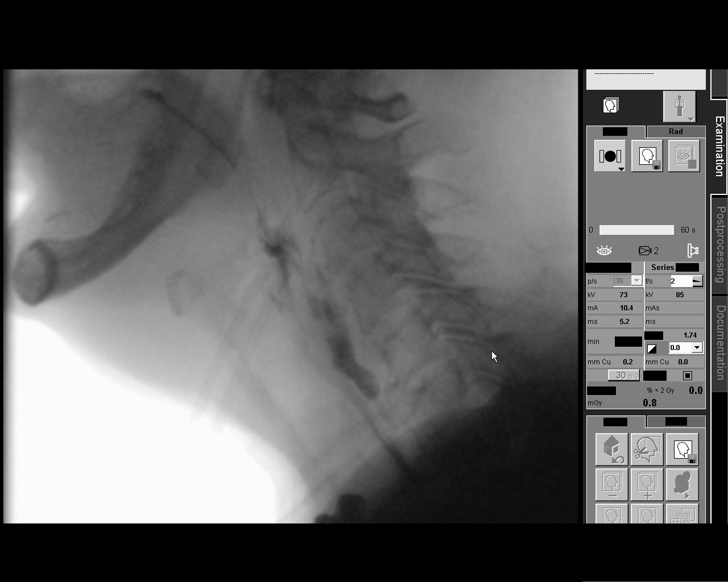

[Series 5: run · 1 of 459 frames shown (4 of 13)]
[frame 230/459]
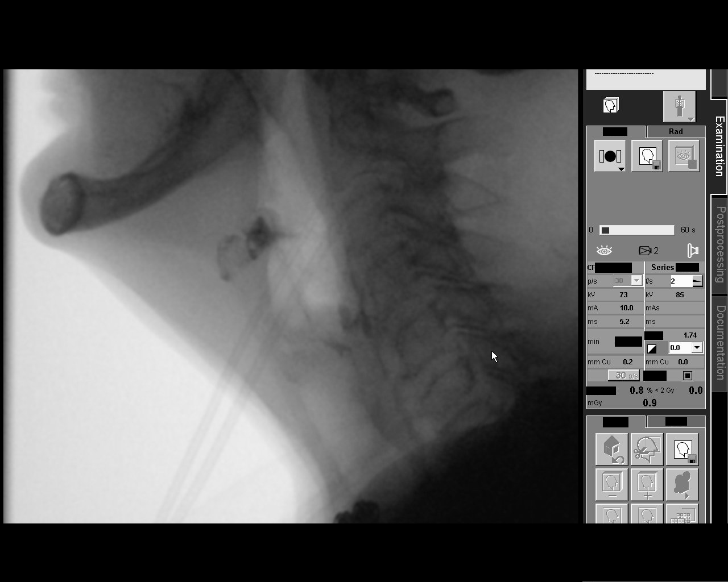

[Series 7: run · 1 of 309 frames shown (5 of 13)]
[frame 155/309]
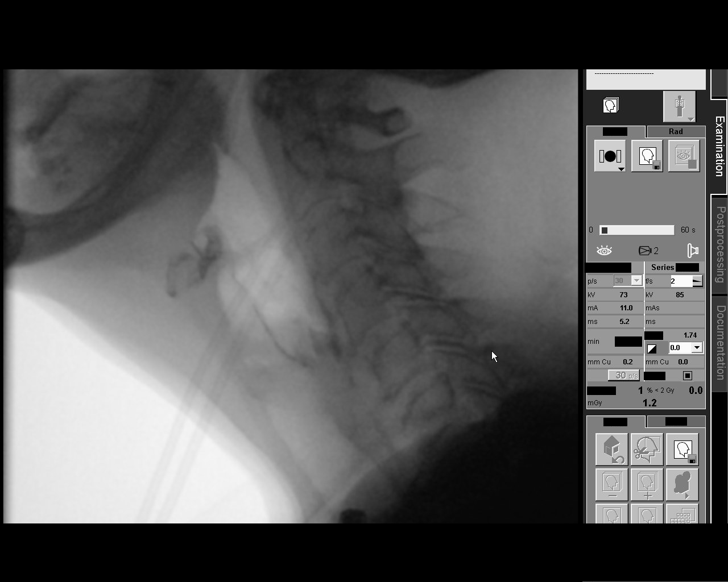

[Series 8: run · 1 of 379 frames shown (6 of 13)]
[frame 323/379]
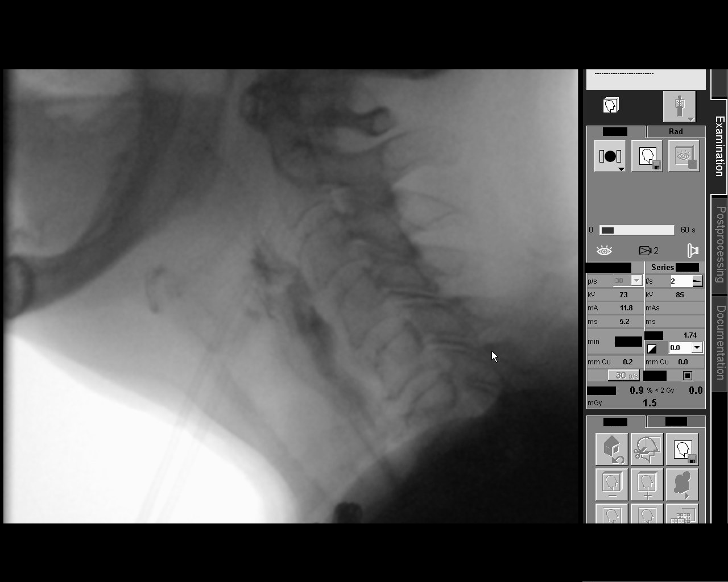

[Series 10: run · 1 of 658 frames shown (7 of 13)]
[frame 386/658]
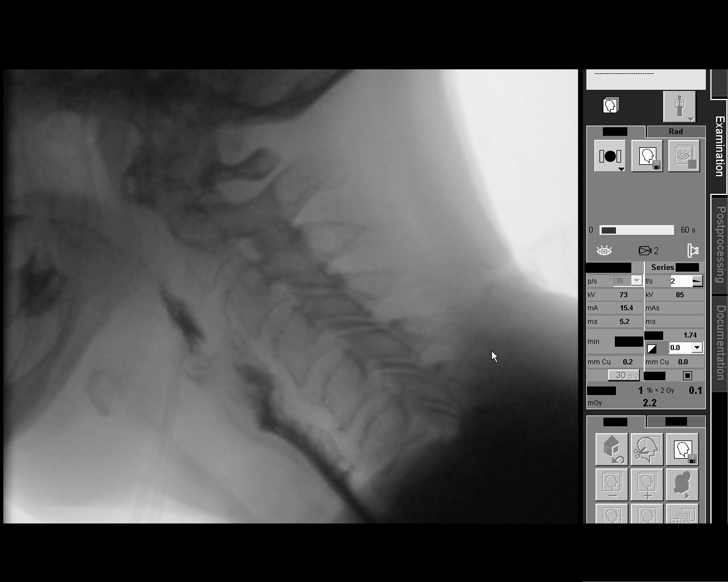

[Series 11: run · 1 of 732 frames shown (8 of 13)]
[frame 367/732]
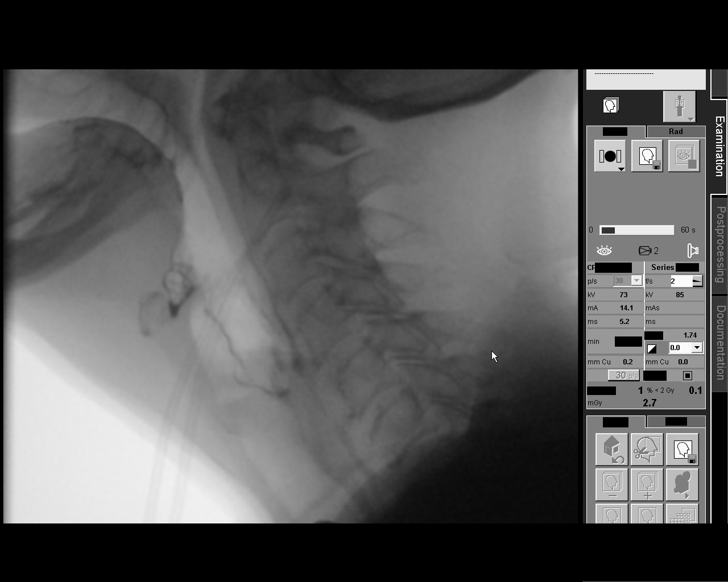

[Series 12: run · 1 of 218 frames shown (9 of 13)]
[frame 186/218]
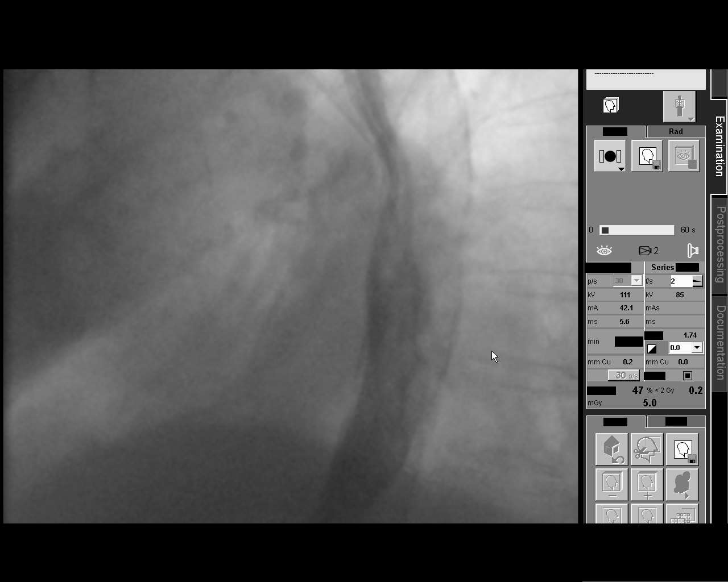

[Series 14: run · 1 of 5 frames shown (10 of 13)]
[frame 5/5]
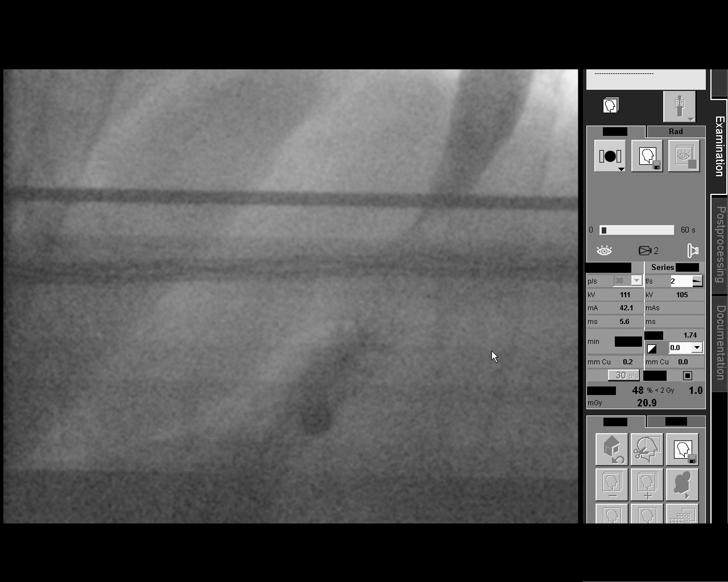

[Series 16: run · 1 of 440 frames shown (11 of 13)]
[frame 67/440]
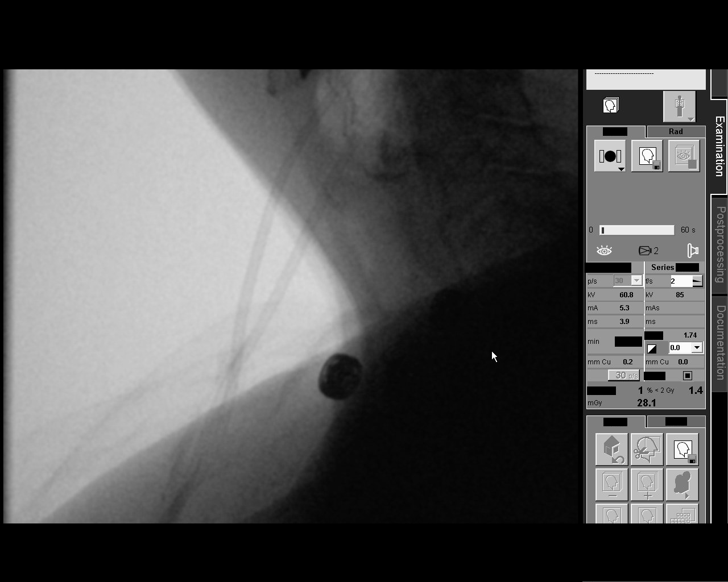

[Series 18: run · 1 of 45 frames shown (12 of 13)]
[frame 7/45]
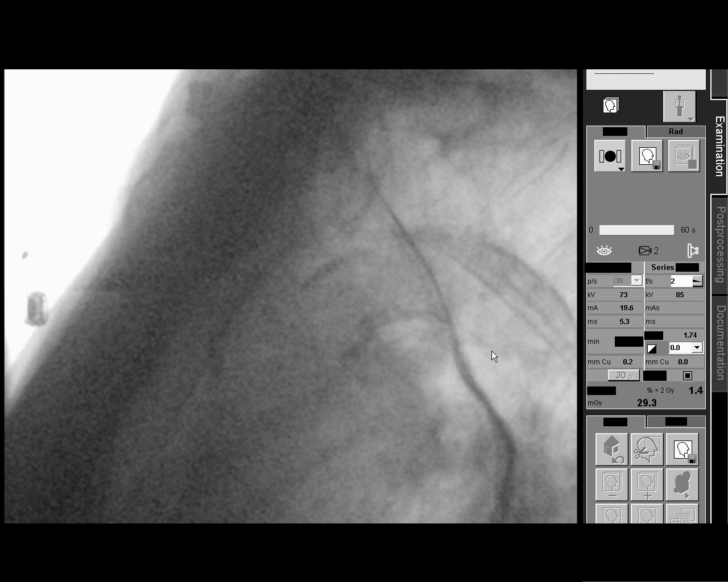

[Series 19: run · 1 of 56 frames shown (13 of 13)]
[frame 48/56]
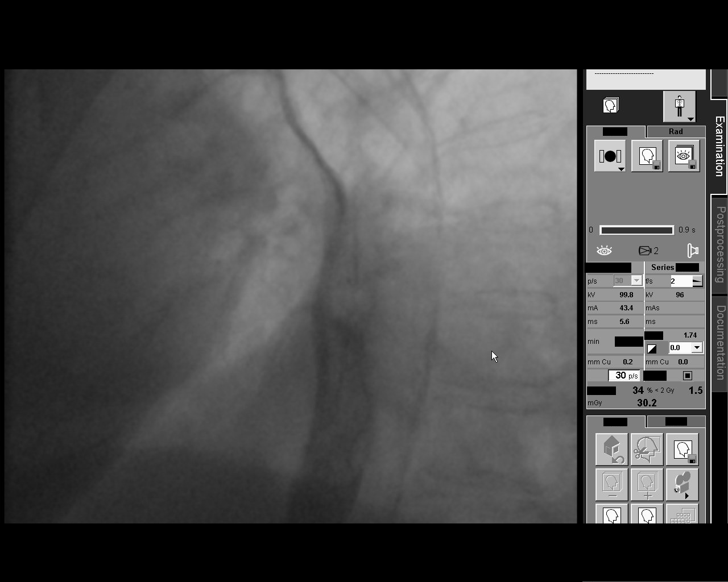

[13 of 24 positions shown; findings below may reference images not displayed]

FLUOROSCOPY FOR SWALLOWING FUNCTION STUDY:
Fluoroscopy was provided for swallowing function study, which was administered by a speech pathologist.  Final results and recommendations from this study are contained within the speech pathology report.
